# Patient Record
Sex: Female | Born: 1935 | Race: White | Hispanic: No | Smoking: Former smoker
Health system: Southern US, Community
[De-identification: ages and names within clinical notes are randomized; demographics above are authoritative.]

## PROBLEM LIST (undated history)

## (undated) DIAGNOSIS — K219 Gastro-esophageal reflux disease without esophagitis: Secondary | ICD-10-CM

## (undated) DIAGNOSIS — Z9889 Other specified postprocedural states: Secondary | ICD-10-CM

## (undated) DIAGNOSIS — I10 Essential (primary) hypertension: Secondary | ICD-10-CM

## (undated) DIAGNOSIS — E785 Hyperlipidemia, unspecified: Secondary | ICD-10-CM

## (undated) DIAGNOSIS — D509 Iron deficiency anemia, unspecified: Secondary | ICD-10-CM

## (undated) DIAGNOSIS — C50919 Malignant neoplasm of unspecified site of unspecified female breast: Secondary | ICD-10-CM

## (undated) DIAGNOSIS — F0390 Unspecified dementia without behavioral disturbance: Secondary | ICD-10-CM

## (undated) DIAGNOSIS — J449 Chronic obstructive pulmonary disease, unspecified: Secondary | ICD-10-CM

## (undated) DIAGNOSIS — E039 Hypothyroidism, unspecified: Secondary | ICD-10-CM

## (undated) DIAGNOSIS — M810 Age-related osteoporosis without current pathological fracture: Secondary | ICD-10-CM

## (undated) DIAGNOSIS — Z8679 Personal history of other diseases of the circulatory system: Secondary | ICD-10-CM

## (undated) DIAGNOSIS — N189 Chronic kidney disease, unspecified: Secondary | ICD-10-CM

## (undated) DIAGNOSIS — I739 Peripheral vascular disease, unspecified: Secondary | ICD-10-CM

## (undated) HISTORY — DX: Other specified postprocedural states: Z98.890

## (undated) HISTORY — PX: ABDOMINAL AORTIC ANEURYSM REPAIR: SUR1152

## (undated) HISTORY — DX: Gastro-esophageal reflux disease without esophagitis: K21.9

## (undated) HISTORY — DX: Unspecified dementia without behavioral disturbance: F03.90

## (undated) HISTORY — DX: Hyperlipidemia, unspecified: E78.5

## (undated) HISTORY — DX: Peripheral vascular disease, unspecified: I73.9

## (undated) HISTORY — DX: Hypothyroidism, unspecified: E03.9

## (undated) HISTORY — DX: Essential (primary) hypertension: I10

## (undated) HISTORY — DX: Age-related osteoporosis without current pathological fracture: M81.0

## (undated) HISTORY — DX: Personal history of other diseases of the circulatory system: Z86.79

## (undated) HISTORY — DX: Chronic kidney disease, unspecified: N18.9

## (undated) HISTORY — DX: Iron deficiency anemia, unspecified: D50.9

## (undated) HISTORY — DX: Malignant neoplasm of unspecified site of unspecified female breast: C50.919

## (undated) HISTORY — PX: BREAST SURGERY: SHX581

## (undated) HISTORY — DX: Chronic obstructive pulmonary disease, unspecified: J44.9

---

## 2013-01-02 ENCOUNTER — Ambulatory Visit (INDEPENDENT_AMBULATORY_CARE_PROVIDER_SITE_OTHER): Payer: Medicare Other | Admitting: Family Medicine

## 2013-01-02 ENCOUNTER — Encounter: Payer: Self-pay | Admitting: Family Medicine

## 2013-01-02 VITALS — BP 102/58 | HR 51 | Temp 97.4°F | Ht 65.0 in | Wt 118.0 lb

## 2013-01-02 DIAGNOSIS — C50919 Malignant neoplasm of unspecified site of unspecified female breast: Secondary | ICD-10-CM | POA: Insufficient documentation

## 2013-01-02 DIAGNOSIS — I129 Hypertensive chronic kidney disease with stage 1 through stage 4 chronic kidney disease, or unspecified chronic kidney disease: Secondary | ICD-10-CM

## 2013-01-02 DIAGNOSIS — I10 Essential (primary) hypertension: Secondary | ICD-10-CM

## 2013-01-02 DIAGNOSIS — I4891 Unspecified atrial fibrillation: Secondary | ICD-10-CM | POA: Insufficient documentation

## 2013-01-02 DIAGNOSIS — Z8679 Personal history of other diseases of the circulatory system: Secondary | ICD-10-CM

## 2013-01-02 DIAGNOSIS — E785 Hyperlipidemia, unspecified: Secondary | ICD-10-CM | POA: Insufficient documentation

## 2013-01-02 DIAGNOSIS — E039 Hypothyroidism, unspecified: Secondary | ICD-10-CM

## 2013-01-02 DIAGNOSIS — D509 Iron deficiency anemia, unspecified: Secondary | ICD-10-CM | POA: Insufficient documentation

## 2013-01-02 DIAGNOSIS — F0390 Unspecified dementia without behavioral disturbance: Secondary | ICD-10-CM | POA: Insufficient documentation

## 2013-01-02 DIAGNOSIS — L608 Other nail disorders: Secondary | ICD-10-CM

## 2013-01-02 DIAGNOSIS — I739 Peripheral vascular disease, unspecified: Secondary | ICD-10-CM

## 2013-01-02 DIAGNOSIS — C50911 Malignant neoplasm of unspecified site of right female breast: Secondary | ICD-10-CM

## 2013-01-02 DIAGNOSIS — L609 Nail disorder, unspecified: Secondary | ICD-10-CM

## 2013-01-02 DIAGNOSIS — M810 Age-related osteoporosis without current pathological fracture: Secondary | ICD-10-CM

## 2013-01-02 DIAGNOSIS — K219 Gastro-esophageal reflux disease without esophagitis: Secondary | ICD-10-CM | POA: Insufficient documentation

## 2013-01-02 DIAGNOSIS — J449 Chronic obstructive pulmonary disease, unspecified: Secondary | ICD-10-CM

## 2013-01-02 DIAGNOSIS — N189 Chronic kidney disease, unspecified: Secondary | ICD-10-CM

## 2013-01-02 DIAGNOSIS — Z9889 Other specified postprocedural states: Secondary | ICD-10-CM

## 2013-01-02 DIAGNOSIS — F0391 Unspecified dementia with behavioral disturbance: Secondary | ICD-10-CM

## 2013-01-02 HISTORY — DX: Gastro-esophageal reflux disease without esophagitis: K21.9

## 2013-01-02 HISTORY — DX: Unspecified dementia, unspecified severity, without behavioral disturbance, psychotic disturbance, mood disturbance, and anxiety: F03.90

## 2013-01-02 HISTORY — DX: Peripheral vascular disease, unspecified: I73.9

## 2013-01-02 HISTORY — DX: Essential (primary) hypertension: I10

## 2013-01-02 HISTORY — DX: Other specified postprocedural states: Z98.890

## 2013-01-02 HISTORY — DX: Personal history of other diseases of the circulatory system: Z86.79

## 2013-01-02 HISTORY — DX: Chronic kidney disease, unspecified: N18.9

## 2013-01-02 HISTORY — DX: Iron deficiency anemia, unspecified: D50.9

## 2013-01-02 HISTORY — DX: Hyperlipidemia, unspecified: E78.5

## 2013-01-02 HISTORY — DX: Age-related osteoporosis without current pathological fracture: M81.0

## 2013-01-02 HISTORY — DX: Malignant neoplasm of unspecified site of unspecified female breast: C50.919

## 2013-01-02 HISTORY — DX: Chronic obstructive pulmonary disease, unspecified: J44.9

## 2013-01-02 HISTORY — DX: Hypothyroidism, unspecified: E03.9

## 2013-01-02 LAB — CBC WITH DIFFERENTIAL/PLATELET
Basophils Relative: 0.5 % (ref 0.0–3.0)
Eosinophils Absolute: 0.5 10*3/uL (ref 0.0–0.7)
Hemoglobin: 11.9 g/dL — ABNORMAL LOW (ref 12.0–15.0)
Lymphocytes Relative: 22.5 % (ref 12.0–46.0)
MCHC: 33.7 g/dL (ref 30.0–36.0)
Monocytes Relative: 7.1 % (ref 3.0–12.0)
Neutro Abs: 8.9 10*3/uL — ABNORMAL HIGH (ref 1.4–7.7)
RBC: 3.76 Mil/uL — ABNORMAL LOW (ref 3.87–5.11)

## 2013-01-02 LAB — BASIC METABOLIC PANEL
CO2: 30 mEq/L (ref 19–32)
Calcium: 9.2 mg/dL (ref 8.4–10.5)
Chloride: 96 mEq/L (ref 96–112)
Sodium: 135 mEq/L (ref 135–145)

## 2013-01-02 MED ORDER — METOPROLOL TARTRATE 100 MG PO TABS: 100.0000 mg | ORAL_TABLET | Freq: Two times a day (BID) | ORAL | Status: DC

## 2013-01-02 MED ORDER — FUROSEMIDE 40 MG PO TABS: 40.0000 mg | ORAL_TABLET | Freq: Every day | ORAL | Status: DC

## 2013-01-02 MED ORDER — CLONIDINE HCL 0.1 MG PO TABS: 0.1000 mg | ORAL_TABLET | Freq: Two times a day (BID) | ORAL | Status: DC

## 2013-01-02 MED ORDER — SIMVASTATIN 40 MG PO TABS: 40.0000 mg | ORAL_TABLET | Freq: Every evening | ORAL | Status: DC

## 2013-01-02 MED ORDER — LEVOTHYROXINE SODIUM 25 MCG PO TABS: 37.5000 ug | ORAL_TABLET | Freq: Every day | ORAL | Status: DC

## 2013-01-02 MED ORDER — AMLODIPINE BESYLATE 5 MG PO TABS: 5.0000 mg | ORAL_TABLET | Freq: Every day | ORAL | Status: DC

## 2013-01-02 MED ORDER — RIVAROXABAN 20 MG PO TABS: 20.0000 mg | ORAL_TABLET | Freq: Every day | ORAL | Status: DC

## 2013-01-02 NOTE — Progress Notes (Signed)
Chief Complaint  Patient presents with  . Establish Care    HPI:  Caroline Underwood is here to establish care. She is and 79yo F with a very complicated PMH who recently moved to the area from [place] , [place] to live with her daughter. Per review of records - recently saw prior PCP (Dr. Bobette Mo in [place], [place])with extensive exam, labs and EKG on 11/30/12 in preparation for R breast mastecotmy for new R breast carcinoma performed on 12/14/2012. Daughter reports she was told all cancer was removed surgically and there was no plan to do radiation or chemo. Told to follow up with oncology here soon after move.  Per review of notes recent Cr 1.7, CBC ok, mild chronic BNP elevated. TSH 5.6, cholesterol 211 with LDL 103 in March - pt placed on L-thyroxine then. Benicar stopped in march per notes when Cr mildy elevated, pt then suffered hypertensive urgency. She was hospitalized in April for AMS, htn urgency. CT and MRI at the time showed no acute changes. She suffered new A. Flutter in hospital and was evaluated by cardiology and started on xarelto. Per notes appears had echo done at the time as well showing diastolic dysfunction and mild pulm htn. Labs from 5/21 show Cr 1.7, BMP otherwise normal, Hgb 12.0. 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 On 5/5 TSH 4.6 and T4 7.0.Labs from April show iron def anemia.  Since recent surgery she has done well per daughter. Is eating well and has gained weight. Denies fevers, chills, dizziness, AMS, CP, SOB, DOE, palpitations, dysuria, bowel or bladder issues. Reports healing well. No Falls or pain. No depression. No acid reflux, stopped PPI recently, had ulcers very remotely per daughter. Wants referral to podiatry for toenail deformity.  [name] Chemical engineer (daughter) - pt reports she is ok with any information given to this daughter Eliane Decree is [place] (daughter): [phone], thinks she is DNR  Has the  following chronic problems and concerns today:  Patient Active Problem List   Diagnosis Date Noted  . Breast cancer 01/02/2013  . Essential hypertension, benign 01/02/2013  . Hx of atrial flutter, on Xarelto 01/02/2013  . Hyperlipemia 01/02/2013  . Osteoporosis, unspecified, on fosamax >5 years in the past per PCP notes 01/02/2013  . PAD (peripheral artery disease) 01/02/2013  . Chronic kidney disease 01/02/2013  . Unspecified hypothyroidism 01/02/2013  . GERD (gastroesophageal reflux disease) 01/02/2013  . Dementia, on Namenda briefly in the past per review of records 01/02/2013  . COPD (chronic obstructive pulmonary disease) 01/02/2013  . Iron deficiency anemia 01/02/2013   ROS: See pertinent positives and negatives per HPI.  Past Medical History  Diagnosis Date  . Status post AAA (abdominal aortic aneurysm) repair 01/02/2013  . PAD (peripheral artery disease) 01/02/2013  . Osteoporosis, unspecified 01/02/2013  . Hyperlipemia 01/02/2013  . Hx of atrial flutter, on Xarelto 01/02/2013  . Essential hypertension, benign 01/02/2013  . Chronic kidney disease 01/02/2013  . Breast cancer 01/02/2013  . COPD (chronic obstructive pulmonary disease) 01/02/2013  . Dementia, on Namenda briefly in the past per review of records 01/02/2013  . GERD (gastroesophageal reflux disease) 01/02/2013  . Iron deficiency anemia 01/02/2013  . Unspecified hypothyroidism 01/02/2013    Family History  Problem Relation Age of Onset  . Cancer Mother     lung  . Heart disease Father 30    History   Social History  . Marital Status: Widowed    Spouse Name: N/A  Number of Children: N/A  . Years of Education: N/A   Social History Main Topics  . Smoking status: Former Games developer  . Smokeless tobacco: None  . Alcohol Use: No  . Drug Use: None  . Sexually Active: None   Other Topics Concern  . None   Social History Narrative   Home Situation: living with daughter [name])      Spiritual Beliefs: none       Lifestyle: get around well in the house - uses a walker sometimes, has not had a history of any falls, has some mild dementia. She needs help with bathing. She needs some help with dressing. She does not do her own cooking or cleaning. Does not drive. She did manage all of her finances until 10/2012.             Current outpatient prescriptions:amLODipine (NORVASC) 5 MG tablet, Take 1 tablet (5 mg total) by mouth daily., Disp: 90 tablet, Rfl: 1;  aspirin 81 MG tablet, Take 81 mg by mouth daily., Disp: , Rfl: ;  cloNIDine (CATAPRES) 0.1 MG tablet, Take 1 tablet (0.1 mg total) by mouth 2 (two) times daily., Disp: 180 tablet, Rfl: 1;  Ergocalciferol (VITAMIN D2 PO), 12.5 tablets. One every Tuesday at 5 pm, Disp: , Rfl:  Fluticasone-Salmeterol (ADVAIR) 100-50 MCG/DOSE AEPB, Inhale 1 puff into the lungs every 12 (twelve) hours., Disp: , Rfl: ;  furosemide (LASIX) 40 MG tablet, Take 1 tablet (40 mg total) by mouth daily., Disp: 90 tablet, Rfl: 1;  lactulose (CHRONULAC) 10 GM/15ML solution, 30 g. 30 ml every 4 hours as needed for constipation, Disp: , Rfl:  levothyroxine (SYNTHROID) 25 MCG tablet, Take 1.5 tablets (37.5 mcg total) by mouth daily before breakfast., Disp: 90 tablet, Rfl: 1;  Melatonin 3 MG TABS, Take 3 mg by mouth at bedtime., Disp: , Rfl: ;  metoprolol (LOPRESSOR) 100 MG tablet, Take 1 tablet (100 mg total) by mouth 2 (two) times daily., Disp: 180 tablet, Rfl: 1;  potassium chloride SA (K-DUR,KLOR-CON) 20 MEQ tablet, Take 20 mEq by mouth daily., Disp: , Rfl:  Rivaroxaban (XARELTO) 20 MG TABS, Take 1 tablet (20 mg total) by mouth daily., Disp: 30 tablet, Rfl: 2;  simvastatin (ZOCOR) 40 MG tablet, Take 1 tablet (40 mg total) by mouth every evening., Disp: 90 tablet, Rfl: 1;  vitamin C (ASCORBIC ACID) 500 MG tablet, Take 500 mg by mouth daily., Disp: , Rfl:   EXAM:  Filed Vitals:   01/02/13 1120  BP: 102/58  Pulse: 51  Temp: 97.4 F (36.3 C)    Body mass index is 19.64  kg/(m^2).  GENERAL: vitals reviewed and listed above, alert, oriented, appears well hydrated and in no acute distress  HEENT: atraumatic, conjunttiva clear, no obvious abnormalities on inspection of external nose and ears  NECK: no obvious masses on inspection  LUNGS: clear to auscultation bilaterally, no wheezes, rales or rhonchi, good air movement  CV: HRRR, no peripheral edema  SKIN: postsurgical healing of R breast with steri strips in place, appears to be healing well with no signs of infection. Stage 1 pressure ulcer sacrum.  MS: moves all extremities without noticeable abnormality  PSYCH: pleasant and cooperative, no obvious depression or anxiety  ASSESSMENT AND PLAN:  Discussed the following assessment and plan:  Breast cancer, right - Plan: Ambulatory referral to Oncology  Essential hypertension, benign - Plan: Ambulatory referral to Cardiology, Basic metabolic panel  Hx of atrial flutter, on Xarelto - Plan: Ambulatory referral to Cardiology  Hyperlipemia - Plan: Ambulatory referral to Cardiology  Osteoporosis, unspecified  PAD (peripheral artery disease) - Plan: Ambulatory referral to Cardiology  Chronic kidney disease, unspecified stage - Plan: Basic metabolic panel  Status post AAA (abdominal aortic aneurysm) repair - Plan: Ambulatory referral to Cardiology  Unspecified hypothyroidism  GERD (gastroesophageal reflux disease)  COPD (chronic obstructive pulmonary disease)  Iron deficiency anemia - Plan: CBC with Differential  Dementia, with behavioral disturbance - Plan: Ambulatory referral to Neurology  Toenail deformity - Plan: Ambulatory referral to Podiatry   -We reviewed the PMH, PSH, FH, SH, Meds and Allergies. -We provided refills for any medications we will prescribe as needed. -We addressed current concerns per orders and patient instructions. -We reviewed and discussed an extensive stack of records from prior PCP and from [facility] from recent visits. -referral to neurologist regarding options for dementia as has tried several medications in the past -referral to cardiolgy for her heart issues and follow up of her AAA, PAD, management of hx a. Flutter and mild DHF -will repeat CBC and BMP today as per daughter not done since surgery, continue current medications -wound care recs for minor stage 1 sacral pressure ulcer and daughter to check daily and RTC immediately if any worsening or persists -placed referral to oncology here in regards to her recent breast cancer and managment -We have advised patient to follow up per instructions below. -hold on iron and Kcl until review of labs, refills for all other medications provided, though will defer to cards once seen to determine need for xarelto >45 minutes spent face to face with this patient and daughter  -Patient advised to return or notify a doctor immediately if symptoms worsen or persist or new concerns arise.  Patient Instructions  Please ensure she has HCPOA and Advanced directives in place [email or web address] [phone]  -We placed a referral for you as discussed to the oncologist, cardiologist, neurologist and podiatrist. It usually takes about 1-2 weeks to process and schedule this referral. If you have not heard from [place] regarding this appointment in 2 weeks please contact our office.  -We have ordered labs or studies at this visit. It can take up to 1-2 weeks for results and processing. We will contact you with instructions IF your results are abnormal. Normal results will be released to your [facility]. If you have not heard from [place] or can not find your results in [facility] in 2 weeks please contact our office.  -PLEASE SIGN UP FOR MYCHART TODAY   We recommend the following healthy lifestyle measures: - eat a healthy diet consisting of lots of vegetables, fruits, beans, nuts, seeds, healthy meats such as  white chicken and fish and whole grains.  - avoid fried foods, fast food, processed foods, sodas, red meet and other fattening foods.  - get a least 150 minutes of aerobic exercise per week.   Follow up in: 2-3 months       KIM, HANNAH R.

## 2013-01-02 NOTE — Patient Instructions (Addendum)
Please ensure she has HCPOA and Advanced directives in place [email or web address] [phone]  -We placed a referral for you as discussed to the oncologist, cardiologist, neurologist and podiatrist. It usually takes about 1-2 weeks to process and schedule this referral. If you have not heard from [place] regarding this appointment in 2 weeks please contact our office.  -We have ordered labs or studies at this visit. It can take up to 1-2 weeks for results and processing. We will contact you with instructions IF your results are abnormal. Normal results will be released to your [facility]. If you have not heard from [place] or can not find your results in [facility] in 2 weeks please contact our office.  -PLEASE SIGN UP FOR MYCHART TODAY   We recommend the following healthy lifestyle measures: - eat a healthy diet consisting of lots of vegetables, fruits, beans, nuts, seeds, healthy meats such as white chicken and fish and whole grains.  - avoid fried foods, fast food, processed foods, sodas, red meet and other fattening foods.  - get a least 150 minutes of aerobic exercise per week.   Follow up in: 2-3 months

## 2013-01-03 ENCOUNTER — Telehealth: Payer: Self-pay | Admitting: Neurology

## 2013-01-03 NOTE — Progress Notes (Signed)
Quick Note:  Left a message for return call. ______ 

## 2013-01-04 ENCOUNTER — Telehealth: Payer: Self-pay | Admitting: *Deleted

## 2013-01-04 ENCOUNTER — Telehealth: Payer: Self-pay

## 2013-01-04 NOTE — Telephone Encounter (Signed)
Pt's daughter will come into the office on next Wednesday to sign DPR due to mother having dementia.

## 2013-01-04 NOTE — Telephone Encounter (Signed)
Spoke w/ pt's daughter [name]) and confirmed 02/05/13 appt w/ pt.  Mailed before appt letter & packet to pt.  Obtained previous facility's phone number to obtain records.  Will call them on Monday.  Emailed Kriste Basque at referring to make aware.  Took paperwork to Med Rec for chart.

## 2013-01-07 NOTE — Progress Notes (Signed)
Quick Note:  Advised that DPR was needed. Pt has dementia. Advised lab appt needed and DPR needs to be signed and pt will receive labs. ______

## 2013-01-08 ENCOUNTER — Encounter: Payer: Self-pay | Admitting: Family Medicine

## 2013-01-09 ENCOUNTER — Other Ambulatory Visit (INDEPENDENT_AMBULATORY_CARE_PROVIDER_SITE_OTHER): Payer: Medicare Other

## 2013-01-09 ENCOUNTER — Other Ambulatory Visit: Payer: Self-pay | Admitting: Family Medicine

## 2013-01-09 DIAGNOSIS — D72829 Elevated white blood cell count, unspecified: Secondary | ICD-10-CM

## 2013-01-09 LAB — CBC WITH DIFFERENTIAL/PLATELET
Basophils Absolute: 0 10*3/uL (ref 0.0–0.1)
Basophils Relative: 0.3 % (ref 0.0–3.0)
Eosinophils Absolute: 0.4 10*3/uL (ref 0.0–0.7)
Eosinophils Relative: 3.9 % (ref 0.0–5.0)
HCT: 34.7 % — ABNORMAL LOW (ref 36.0–46.0)
Lymphs Abs: 2.3 10*3/uL (ref 0.7–4.0)
MCHC: 33.3 g/dL (ref 30.0–36.0)
MCV: 94.7 fl (ref 78.0–100.0)
Monocytes Absolute: 0.8 10*3/uL (ref 0.1–1.0)
Neutro Abs: 6.7 10*3/uL (ref 1.4–7.7)
Neutrophils Relative %: 65.4 % (ref 43.0–77.0)
RBC: 3.66 Mil/uL — ABNORMAL LOW (ref 3.87–5.11)

## 2013-01-10 NOTE — Progress Notes (Signed)
Quick Note:  Left a detailed message for pt's daughter at designated cell phone number. ______

## 2013-01-11 ENCOUNTER — Encounter: Payer: Self-pay | Admitting: Family Medicine

## 2013-01-14 NOTE — Telephone Encounter (Signed)
Pls advise.  Do you want this called in?

## 2013-01-15 ENCOUNTER — Encounter: Payer: Self-pay | Admitting: Family Medicine

## 2013-01-16 MED ORDER — FLUTICASONE-SALMETEROL 100-50 MCG/DOSE IN AEPB: 1.0000 | INHALATION_SPRAY | Freq: Two times a day (BID) | RESPIRATORY_TRACT | Status: DC

## 2013-01-16 NOTE — Addendum Note (Signed)
Addended by: Azucena Freed on: 01/16/2013 11:05 AM   Modules accepted: Orders

## 2013-01-16 NOTE — Telephone Encounter (Signed)
Caroline Underwood, ok to refill. Can you please find out what medications need refills? They need to let their pharmacy know to send refill request to [place] too. Thanks.

## 2013-01-16 NOTE — Telephone Encounter (Signed)
Pls advise.  

## 2013-01-16 NOTE — Telephone Encounter (Signed)
Rx sent to pharmacy   

## 2013-01-22 ENCOUNTER — Telehealth: Payer: Self-pay | Admitting: Family Medicine

## 2013-01-22 ENCOUNTER — Encounter: Payer: Self-pay | Admitting: Family Medicine

## 2013-01-22 NOTE — Telephone Encounter (Signed)
Pls advise.  

## 2013-01-22 NOTE — Telephone Encounter (Signed)
Caroline Underwood,  She probably should be seen. This is a new problem I believe?

## 2013-01-23 ENCOUNTER — Ambulatory Visit (INDEPENDENT_AMBULATORY_CARE_PROVIDER_SITE_OTHER): Payer: Medicare Other | Admitting: Neurology

## 2013-01-23 ENCOUNTER — Encounter: Payer: Self-pay | Admitting: Neurology

## 2013-01-23 VITALS — BP 148/70 | HR 60 | Temp 97.5°F | Ht 65.0 in | Wt 119.2 lb

## 2013-01-23 DIAGNOSIS — G309 Alzheimer's disease, unspecified: Secondary | ICD-10-CM | POA: Insufficient documentation

## 2013-01-23 DIAGNOSIS — F0280 Dementia in other diseases classified elsewhere without behavioral disturbance: Secondary | ICD-10-CM

## 2013-01-23 MED ORDER — MEMANTINE HCL ER 7 MG PO CP24: 7.0000 mg | ORAL_CAPSULE | Freq: Every day | ORAL | Status: DC

## 2013-01-23 NOTE — Progress Notes (Signed)
NEUROLOGY CONSULTATION NOTE  Selena Swaminathan MRN: [id number] DOB: [birth year]  Referring physician: Dr. Selena Batten Primary care physician: Dr. Selena Batten  Reason for consult:  dementia  HISTORY OF PRESENT ILLNESS: Caroline Underwood is a 79 y.o. female with history of right breast cancer, hypertension, peripheral artery disease, chronic kidney disease, iron deficiency anemia, COPD, and a flutter presents to establish care regarding history of dementia. She is accompanied by her daughter.  History obtained from the chart and her daughter.  She recently moved from [facility] to live with her daughter.  She began experiencing symptoms many years ago. She was having problems with memory and often repeating questions, as well as forgetting tasks and misplacing objects. At some point, several years ago, she was started on Namenda by her primary care physician, but discontinued it at the request of her son. There has been a progressive decline in her condition. Her decline has significantly worsened this past year, especially since a recent hospitalization earlier this year. Earlier this year, she was found to have an elevated creatinine level. Her doctor therefore stop some of her blood pressure medications. She then developed an episode of altered mental status, where she was walking around naked and then was laying on the couch for 2 days. When she was admitted to the hospital, she was found to have hypertensive urgency.  She was found to have hypertensive urgency. MRI of the brain did not show any acute changes. She was also found to have a flutter and was started on Xarelto.  Recent labs showed a TSH of 5.6 and she was subsequently started on Synthroid.  Cr was mildly elevated at 1.7.  Ammonia level was 23.  B12 was ordered and reportedly normal.  Her confusion has improved and she is functioning better, but definitely worse than before. She has good days and bad days. She has always been able to pay  her bills, up until this past year. She constantly chews her lip. She sleeps okay and takes melatonin. She denies feelings of depression. She needs assistance with activities of daily living, including taking medications, bathing, and dressing. She emulate with a walker. She usually spends the entire day sitting and watching TV. She doesn't read much except for Mr. Bea Laura. Anson Fret. She enjoys trying to solve the puzzle before the end of the story. Most other books, she has no interest in and quickly stopped reading. She also is not interested in crossword puzzles or brain teasers.  She does not have any hallucinations, delusions, or change in personality.  PAST MEDICAL HISTORY: Past Medical History  Diagnosis Date  . Status post AAA (abdominal aortic aneurysm) repair 01/02/2013  . PAD (peripheral artery disease) 01/02/2013  . Osteoporosis, unspecified 01/02/2013  . Hyperlipemia 01/02/2013  . Hx of atrial flutter, on Xarelto 01/02/2013  . Essential hypertension, benign 01/02/2013  . Chronic kidney disease 01/02/2013  . Breast cancer 01/02/2013  . COPD (chronic obstructive pulmonary disease) 01/02/2013  . Dementia, on Namenda briefly in the past per review of records 01/02/2013  . GERD (gastroesophageal reflux disease) 01/02/2013  . Iron deficiency anemia 01/02/2013  . Unspecified hypothyroidism 01/02/2013    PAST SURGICAL HISTORY: Past Surgical History  Procedure Laterality Date  . Abdominal aortic aneurysm repair    . Breast surgery      right lumpectomy    MEDICATIONS: Current Outpatient Prescriptions on File Prior to Visit  Medication Sig Dispense Refill  . amLODipine (NORVASC) 5 MG tablet Take 1 tablet (5 mg total) by  mouth daily.  90 tablet  1  . aspirin 81 MG tablet Take 81 mg by mouth daily.      . cloNIDine (CATAPRES) 0.1 MG tablet Take 1 tablet (0.1 mg total) by mouth 2 (two) times daily.  180 tablet  1  . Ergocalciferol (VITAMIN D2 PO) 12.5 tablets. One every Tuesday at 5 pm      .  Fluticasone-Salmeterol (ADVAIR) 100-50 MCG/DOSE AEPB Inhale 1 puff into the lungs every 12 (twelve) hours.  60 each  3  . furosemide (LASIX) 40 MG tablet Take 1 tablet (40 mg total) by mouth daily.  90 tablet  1  . lactulose (CHRONULAC) 10 GM/15ML solution 30 g. 30 ml every 4 hours as needed for constipation      . levothyroxine (SYNTHROID) 25 MCG tablet Take 1.5 tablets (37.5 mcg total) by mouth daily before breakfast.  90 tablet  1  . Melatonin 3 MG TABS Take 3 mg by mouth at bedtime.      . metoprolol (LOPRESSOR) 100 MG tablet Take 1 tablet (100 mg total) by mouth 2 (two) times daily.  180 tablet  1  . potassium chloride SA (K-DUR,KLOR-CON) 20 MEQ tablet Take 20 mEq by mouth daily.      . Rivaroxaban (XARELTO) 20 MG TABS Take 1 tablet (20 mg total) by mouth daily.  30 tablet  2  . simvastatin (ZOCOR) 40 MG tablet Take 1 tablet (40 mg total) by mouth every evening.  90 tablet  1  . vitamin C (ASCORBIC ACID) 500 MG tablet Take 500 mg by mouth daily.       No current facility-administered medications on file prior to visit.    ALLERGIES: No Known Allergies  FAMILY HISTORY: Family History  Problem Relation Age of Onset  . Cancer Mother     lung  . Heart disease Father 34    SOCIAL HISTORY: History   Social History  . Marital Status: Widowed    Spouse Name: N/A    Number of Children: N/A  . Years of Education: N/A   Occupational History  . Not on file.   Social History Main Topics  . Smoking status: Former Games developer  . Smokeless tobacco: Never Used  . Alcohol Use: No  . Drug Use: No  . Sexually Active: Not on file   Other Topics Concern  . Not on file   Social History Narrative   Home Situation: living with daughter [name])      Spiritual Beliefs: none      Lifestyle: get around well in the house - uses a walker sometimes, has not had a history of any falls, has some mild dementia. She needs help with bathing. She needs some help with dressing. She does not do her own  cooking or cleaning. Does not drive. She did manage all of her finances until 10/2012.             REVIEW OF SYSTEMS: Constitutional: No fevers, chills, or sweats, no generalized fatigue, change in appetite Eyes: No visual changes, double vision, eye pain Ear, nose and throat: No hearing loss, ear pain, nasal congestion, sore throat Cardiovascular: No chest pain, palpitations Respiratory:  No shortness of breath at rest or with exertion, wheezes GastrointestinaI: No nausea, vomiting, diarrhea, abdominal pain, fecal incontinence Genitourinary:  No dysuria, urinary retention or frequency Musculoskeletal:  No neck pain, back pain Integumentary: No rash, pruritus, skin lesions Neurological: as above Psychiatric: No depression, insomnia, anxiety Endocrine: No palpitations, fatigue, diaphoresis, mood  swings, change in appetite, change in weight, increased thirst Hematologic/Lymphatic:  No anemia, purpura, petechiae. Allergic/Immunologic: no itchy/runny eyes, nasal congestion, recent allergic reactions, rashes  PHYSICAL EXAM: Filed Vitals:   01/23/13 0752  BP: 148/70  Pulse: 60  Temp: 97.5 F (36.4 C)   General: No acute distress Head:  Normocephalic/atraumatic Neck: supple, no paraspinal tenderness, full range of motion Back: No paraspinal tenderness Heart: regular rate and rhythm Lungs: Clear to auscultation bilaterally. Neurological Exam: Mental status: alert and oriented to person, place (except city), ant time (except date and day).speech fluent and not dysarthric. Naming, repetition, and following simple commands intact. She had significant difficulty with visual spatial and executive functioning. She had difficulty copying a cube and appropriately placing the numbers in a clock. Attention and serial 7 subtraction is intact. She had significant difficulty with abstraction. Delayed recall was poor. She was unable to recall any words after several minutes. MOCA score 15/30. Cranial  nerves: CN I: not tested CN II: pupils equal, round and reactive to light, visual fields intact, fundi unremarkable CN III, IV, VI:  full range of motion, no nystagmus, no ptosis CN V: facial sensation intact CN VII: upper and lower face symmetric CN VIII: hearing intact CN IX, X: gag intact, uvula midline CN XI: sternocleidomastoid and trapezius muscles intact CN XII: tongue midline Bulk & Tone: normal, no fasciculations. Muscle strength:5/5 throughout Sensation: pinprick sensation intact. Reduced vibration sensation in the feet. Deep Tendon Reflexes: 1+ throughout except absent in the ankles. Toes downgoing. Finger to nose testing: no dysmetria. Gait: wide-based with small steps, with assistance from a walker. Romberg with sway.  IMPRESSION & PLAN: Caroline Underwood is a 79 y.o. female with probable Alzheimer's dementia. 1.  We will start Namenda ER and titrate slowly to a goal of 28 mg daily.side effects discussed. She would call with any problems. 2.  Read books and newspapers.  Continue reading mysteries and try to solve the case. 3.  Walk daily or at least 3 times a week around the block. 4.  Provided information regarding Alzheimer's support groups. 5.  Follow up in 6 months and call with any questions or concerns.  60 minutes spent with the patient and her daughter, over 50% spent counseling and coordinating care.  Thank you for allowing me to take part in the care of this patient.  Shon Millet, DO  CC: Terressa Koyanagi, DO

## 2013-01-23 NOTE — Patient Instructions (Addendum)
1.  We will start memantine ER (Namenda XR) 7mg  tablets.  We will increase dose to goal of 28mg  daily, as per the following schedule.  Start 1 tablet daily for one week, then 2 tablets daily for one week, then 3 tablets daily for one week.  At that point, call the clinic and we can prescribe a larger single dose tablet of 28mg , to be taken daily.  Side effects include dizziness, headache, diarrhea or constipation.  Call with any questions or concerns.  2.  Read books and newspapers.  Continue reading mysteries and try to solve the case. 3.  Walk daily or at least 3 times a week around the block. 4.  Take a look at the packets, regarding Alzheimer's support groups. 5.  Follow up in 6 months and call with any questions or concerns.

## 2013-01-24 ENCOUNTER — Encounter: Payer: Self-pay | Admitting: Family Medicine

## 2013-01-24 MED ORDER — POTASSIUM CHLORIDE CRYS ER 20 MEQ PO TBCR: 20.0000 meq | EXTENDED_RELEASE_TABLET | Freq: Every day | ORAL | Status: DC

## 2013-01-24 NOTE — Telephone Encounter (Signed)
Rx for potassium called in to pharmacy.

## 2013-01-25 ENCOUNTER — Other Ambulatory Visit: Payer: Self-pay | Admitting: *Deleted

## 2013-01-25 DIAGNOSIS — C50511 Malignant neoplasm of lower-outer quadrant of right female breast: Secondary | ICD-10-CM

## 2013-01-25 DIAGNOSIS — C50519 Malignant neoplasm of lower-outer quadrant of unspecified female breast: Secondary | ICD-10-CM | POA: Insufficient documentation

## 2013-02-01 ENCOUNTER — Encounter: Payer: Self-pay | Admitting: Neurology

## 2013-02-05 ENCOUNTER — Ambulatory Visit (HOSPITAL_BASED_OUTPATIENT_CLINIC_OR_DEPARTMENT_OTHER): Payer: Medicare Other

## 2013-02-05 ENCOUNTER — Encounter: Payer: Self-pay | Admitting: Oncology

## 2013-02-05 ENCOUNTER — Other Ambulatory Visit (HOSPITAL_BASED_OUTPATIENT_CLINIC_OR_DEPARTMENT_OTHER): Payer: Medicare Other | Admitting: Lab

## 2013-02-05 ENCOUNTER — Ambulatory Visit (HOSPITAL_BASED_OUTPATIENT_CLINIC_OR_DEPARTMENT_OTHER): Payer: Medicare Other | Admitting: Oncology

## 2013-02-05 ENCOUNTER — Telehealth: Payer: Self-pay | Admitting: *Deleted

## 2013-02-05 VITALS — BP 159/65 | HR 66 | Temp 97.7°F | Resp 20 | Ht 65.0 in | Wt 121.7 lb

## 2013-02-05 DIAGNOSIS — C50511 Malignant neoplasm of lower-outer quadrant of right female breast: Secondary | ICD-10-CM

## 2013-02-05 DIAGNOSIS — C50519 Malignant neoplasm of lower-outer quadrant of unspecified female breast: Secondary | ICD-10-CM

## 2013-02-05 DIAGNOSIS — C50919 Malignant neoplasm of unspecified site of unspecified female breast: Secondary | ICD-10-CM

## 2013-02-05 DIAGNOSIS — C50911 Malignant neoplasm of unspecified site of right female breast: Secondary | ICD-10-CM

## 2013-02-05 LAB — COMPREHENSIVE METABOLIC PANEL (CC13)
ALT: 19 U/L (ref 0–55)
AST: 26 U/L (ref 5–34)
Albumin: 3.7 g/dL (ref 3.5–5.0)
Calcium: 9.6 mg/dL (ref 8.4–10.4)
Chloride: 100 mEq/L (ref 98–109)
Potassium: 4.3 mEq/L (ref 3.5–5.1)

## 2013-02-05 LAB — CBC WITH DIFFERENTIAL/PLATELET
BASO%: 0.5 % (ref 0.0–2.0)
MCHC: 33.5 g/dL (ref 31.5–36.0)
MONO#: 0.9 10*3/uL (ref 0.1–0.9)
RBC: 4.09 10*6/uL (ref 3.70–5.45)
WBC: 12.2 10*3/uL — ABNORMAL HIGH (ref 3.9–10.3)
lymph#: 2.9 10*3/uL (ref 0.9–3.3)
nRBC: 0 % (ref 0–0)

## 2013-02-05 NOTE — Telephone Encounter (Signed)
This RN called to [facility] at [phone] and was transferred to pathology department.  Obtained identified VM. Request left for a return call to obtain result of pathology from [facility].

## 2013-02-05 NOTE — Progress Notes (Signed)
Checked in new patient. She ok daughter to sign. The email address is for the daughter ms. Schrecengost. Didn't ask if there is an living will/POA.

## 2013-02-10 NOTE — Progress Notes (Signed)
ID: Lurlean Horns OB: 03-12-1922  MR#: [id number]  CSN#:[id number]  PCP: Terressa Koyanagi., DO GYN:   SU:  OTHER MD:   HISTORY OF PRESENT ILLNESS: The patient herself palpated a mass in her right breast. She underwent mammography may first 2014 at [facility], and the radiologist notes that the patient has heterogeneously dense breasts. There was a palpable lump in the right breast at the 3:00 position and coarse dystrophic calcifications in the right upper lateral breast. The mass persisted on spot compression views and an ultrasound it measured 2.9 cm. Ultrasound of the right axilla demonstrated normal right axillary lymph nodes. Biopsy the same day reportedly showed an invasive ductal carcinoma, nuclear grade 2. The patient proceeded to definitive surgery later that same month, the pathology from that procedure is being obtained.  INTERVAL HISTORY: Ms. Kollman (pronounced "ashen") is establishing herself in my breast clinic today, accompanied by her daughter [name]  REVIEW OF SYSTEMS: The patient tells me she underwent of her right breast surgery without unusual complications, particularly there was no significant pain, bleeding, swelling, or fever. She has a little bit of a running nose, her ankles swell, she has poor circulation, she has shortness of breath with most activities, she has stress urinary incontinence, and she is very forgetful. A detailed review of systems today was otherwise noncontributory  PAST MEDICAL HISTORY: Past Medical History  Diagnosis Date  . Status post AAA (abdominal aortic aneurysm) repair 01/02/2013  . PAD (peripheral artery disease) 01/02/2013  . Osteoporosis, unspecified 01/02/2013  . Hyperlipemia 01/02/2013  . Hx of atrial flutter, on Xarelto 01/02/2013  . Essential hypertension, benign 01/02/2013  . Chronic kidney disease 01/02/2013  . Breast cancer 01/02/2013  . COPD (chronic obstructive pulmonary disease) 01/02/2013  . Dementia, on Namenda  briefly in the past per review of records 01/02/2013  . GERD (gastroesophageal reflux disease) 01/02/2013  . Iron deficiency anemia 01/02/2013  . Unspecified hypothyroidism 01/02/2013    PAST SURGICAL HISTORY: Past Surgical History  Procedure Laterality Date  . Abdominal aortic aneurysm repair    . Breast surgery      right lumpectomy    FAMILY HISTORY Family History  Problem Relation Age of Onset  . Cancer Mother     lung  . Heart disease Father 35   the patient's father died at the age of 77 from heart disease. The patient's mother died at the age of 9 from lung cancer in the setting of tobacco abuse. The patient has 2 brothers, no sisters. There is no history of breast or ovarian cancer in the family to her knowledge.  GYNECOLOGIC HISTORY:  Menarche age 87, first live birth age 33, she is GX P3. She does not recall when she went through menopause, but he was "probably in my late 39s". She did not take hormone replacement.  SOCIAL HISTORY:  Roddie Mc used to work as an Print production planner an Airline pilot. She has been widowed since 1997. She moved to this area to live with her daughter Park Liter who works for proctoscopy and ample. Melina Schools herself became a widow in 1999. She has a 68 year old son and 54 year old daughter and 2 grandchildren, living in [place]. [name] can be reached at [phone]. The patient's other 2 children are Annette Stable, who lives in [place], and according to the patient has an alcohol problem and is unemployed; and [address], who lives in [place], [place] and is a IT consultant. The patient has a total of 4 grandchildren. She is not  a church attender    ADVANCED DIRECTIVES: Not in place. The patient was given the appropriate documents to consider completing during her initial visit here 02/05/2013. She intends to name her daughter [name] as her healthcare power of attorney   HEALTH MAINTENANCE: History  Substance Use Topics  . Smoking  status: Former Games developer  . Smokeless tobacco: Never Used  . Alcohol Use: No     Colonoscopy:  PAP:  Bone density:  Lipid panel:  No Known Allergies  Current Outpatient Prescriptions  Medication Sig Dispense Refill  . amLODipine (NORVASC) 5 MG tablet Take 1 tablet (5 mg total) by mouth daily.  90 tablet  1  . aspirin 81 MG tablet Take 81 mg by mouth daily.      . cloNIDine (CATAPRES) 0.1 MG tablet Take 1 tablet (0.1 mg total) by mouth 2 (two) times daily.  180 tablet  1  . Ergocalciferol (VITAMIN D2 PO) 12.5 tablets. One every Tuesday at 5 pm      . Fluticasone-Salmeterol (ADVAIR) 100-50 MCG/DOSE AEPB Inhale 1 puff into the lungs every 12 (twelve) hours.  60 each  3  . furosemide (LASIX) 40 MG tablet Take 1 tablet (40 mg total) by mouth daily.  90 tablet  1  . lactulose (CHRONULAC) 10 GM/15ML solution 30 g. 30 ml every 4 hours as needed for constipation      . Melatonin 3 MG TABS Take 3 mg by mouth at bedtime.      . Memantine HCl ER (NAMENDA XR) 7 MG CP24 Take 7 mg by mouth at bedtime.  30 capsule  0  . metoprolol (LOPRESSOR) 100 MG tablet Take 1 tablet (100 mg total) by mouth 2 (two) times daily.  180 tablet  1  . potassium chloride SA (K-DUR,KLOR-CON) 20 MEQ tablet Take 1 tablet (20 mEq total) by mouth daily.  30 tablet  3  . Rivaroxaban (XARELTO) 20 MG TABS Take 1 tablet (20 mg total) by mouth daily.  30 tablet  2  . simvastatin (ZOCOR) 40 MG tablet Take 1 tablet (40 mg total) by mouth every evening.  90 tablet  1  . vitamin C (ASCORBIC ACID) 500 MG tablet Take 500 mg by mouth daily.      Marland Kitchen levothyroxine (SYNTHROID) 25 MCG tablet Take 1.5 tablets (37.5 mcg total) by mouth daily before breakfast.  90 tablet  1   No current facility-administered medications for this visit.    OBJECTIVE: Elderly white woman in no acute distress Filed Vitals:   02/05/13 1610  BP: 159/65  Pulse: 66  Temp: 97.7 F (36.5 C)  Resp: 20     Body mass index is 20.25 kg/(m^2).    ECOG FS: 2  Sclerae  unicteric Oropharynx clear No cervical or supraclavicular adenopathy Lungs no rales or rhonchi Heart regular rate and rhythm Abd benign MSK no focal spinal tenderness Neuro: non-focal, w oriented to person, place and year; pleasant affect Breasts: The right breast is status post lumpectomy. There is no evidence of local recurrence. The right axilla is benign. The left breast is unremarkable.   LAB RESULTS:  CMP     Component Value Date/Time   NA 141 02/05/2013 1551   NA 135 01/02/2013 1226   K 4.3 02/05/2013 1551   K 4.6 01/02/2013 1226   CL 96 01/02/2013 1226   CO2 30* 02/05/2013 1551   CO2 30 01/02/2013 1226   GLUCOSE 100 02/05/2013 1551   GLUCOSE 111* 01/02/2013 1226   BUN 42.9*  02/05/2013 1551   BUN 34* 01/02/2013 1226   CREATININE 1.9* 02/05/2013 1551   CREATININE 1.6* 01/02/2013 1226   CALCIUM 9.6 02/05/2013 1551   CALCIUM 9.2 01/02/2013 1226   PROT 7.5 02/05/2013 1551   ALBUMIN 3.7 02/05/2013 1551   AST 26 02/05/2013 1551   ALT 19 02/05/2013 1551   ALKPHOS 76 02/05/2013 1551   BILITOT 0.21 02/05/2013 1551    I No results found for this basename: SPEP, UPEP,  kappa and lambda light chains    Lab Results  Component Value Date   WBC 12.2* 02/05/2013   NEUTROABS 7.8* 02/05/2013   HGB 12.7 02/05/2013   HCT 37.9 02/05/2013   MCV 92.7 02/05/2013   PLT 211 02/05/2013      Chemistry      Component Value Date/Time   NA 141 02/05/2013 1551   NA 135 01/02/2013 1226   K 4.3 02/05/2013 1551   K 4.6 01/02/2013 1226   CL 96 01/02/2013 1226   CO2 30* 02/05/2013 1551   CO2 30 01/02/2013 1226   BUN 42.9* 02/05/2013 1551   BUN 34* 01/02/2013 1226   CREATININE 1.9* 02/05/2013 1551   CREATININE 1.6* 01/02/2013 1226      Component Value Date/Time   CALCIUM 9.6 02/05/2013 1551   CALCIUM 9.2 01/02/2013 1226   ALKPHOS 76 02/05/2013 1551   AST 26 02/05/2013 1551   ALT 19 02/05/2013 1551   BILITOT 0.21 02/05/2013 1551       No results found for this basename: LABCA2    No components found with this  basename: LABCA125    No results found for this basename: INR,  in the last 168 hours  Urinalysis No results found for this basename: colorurine, appearanceur, labspec, phurine, glucoseu, hgbur, bilirubinur, ketonesur, proteinur, urobilinogen, nitrite, leukocytesur    STUDIES: No results found.  ASSESSMENT: 79 y.o. woman recently moved to [facility], status post right breast upper inner quadrant biopsy may 08/06/2012 for a clinical T2 N0, stage IIA invasive ductal carcinoma, grade 2, prognostic panel not available  (1) status post definitive surgery 12/14/2012 for a pT2 pNX invasive ductal carcinoma, triple negative, with negative margins.  PLAN: We spent the better part of today's hour-long visit discussing the biology of breast cancer in general and the specifics of Mrs. Biby's situation. At the time of the patient's visit, we did not have the pathology report from [facility], but this was subsequently obtained and it shows her tumor to have been triple negative. I am separately writing the patient a letter with this information.  Basilia and her daughter understand that we have very little data for chemotherapy in patients over 80. Given her multiple other medical problems, the benefit of adjuvant chemotherapy, if any, is likely to be marginal. Accordingly, no adjuvant chemotherapy is planned.  I have scheduled her to return to see [place] in November, and then will see her again in May after her next mammography. Thereafter I will see her on a once a year basis assuming there are no new developments.    Lowella Dell, MD   02/10/2013 10:11 AM

## 2013-02-15 ENCOUNTER — Emergency Department (HOSPITAL_COMMUNITY): Payer: Medicare Other

## 2013-02-15 ENCOUNTER — Encounter (HOSPITAL_COMMUNITY): Payer: Self-pay | Admitting: Adult Health

## 2013-02-15 ENCOUNTER — Inpatient Hospital Stay (HOSPITAL_COMMUNITY)
Admission: EM | Admit: 2013-02-15 | Discharge: 2013-02-17 | DRG: 392 | Disposition: A | Discharge status: Home / Self Care | Payer: Medicare Other | Attending: Internal Medicine | Admitting: Internal Medicine

## 2013-02-15 DIAGNOSIS — Z87891 Personal history of nicotine dependence: Secondary | ICD-10-CM

## 2013-02-15 DIAGNOSIS — R195 Other fecal abnormalities: Secondary | ICD-10-CM

## 2013-02-15 DIAGNOSIS — N189 Chronic kidney disease, unspecified: Secondary | ICD-10-CM | POA: Diagnosis present

## 2013-02-15 DIAGNOSIS — E039 Hypothyroidism, unspecified: Secondary | ICD-10-CM

## 2013-02-15 DIAGNOSIS — I739 Peripheral vascular disease, unspecified: Secondary | ICD-10-CM

## 2013-02-15 DIAGNOSIS — K5289 Other specified noninfective gastroenteritis and colitis: Principal | ICD-10-CM | POA: Diagnosis present

## 2013-02-15 DIAGNOSIS — I10 Essential (primary) hypertension: Secondary | ICD-10-CM

## 2013-02-15 DIAGNOSIS — J449 Chronic obstructive pulmonary disease, unspecified: Secondary | ICD-10-CM | POA: Diagnosis present

## 2013-02-15 DIAGNOSIS — N184 Chronic kidney disease, stage 4 (severe): Secondary | ICD-10-CM

## 2013-02-15 DIAGNOSIS — D509 Iron deficiency anemia, unspecified: Secondary | ICD-10-CM

## 2013-02-15 DIAGNOSIS — I4891 Unspecified atrial fibrillation: Secondary | ICD-10-CM

## 2013-02-15 DIAGNOSIS — K921 Melena: Secondary | ICD-10-CM | POA: Diagnosis present

## 2013-02-15 DIAGNOSIS — K219 Gastro-esophageal reflux disease without esophagitis: Secondary | ICD-10-CM

## 2013-02-15 DIAGNOSIS — F0390 Unspecified dementia without behavioral disturbance: Secondary | ICD-10-CM

## 2013-02-15 DIAGNOSIS — D72829 Elevated white blood cell count, unspecified: Secondary | ICD-10-CM

## 2013-02-15 DIAGNOSIS — N390 Urinary tract infection, site not specified: Secondary | ICD-10-CM | POA: Diagnosis present

## 2013-02-15 DIAGNOSIS — N183 Chronic kidney disease, stage 3 unspecified: Secondary | ICD-10-CM

## 2013-02-15 DIAGNOSIS — I129 Hypertensive chronic kidney disease with stage 1 through stage 4 chronic kidney disease, or unspecified chronic kidney disease: Secondary | ICD-10-CM | POA: Diagnosis present

## 2013-02-15 DIAGNOSIS — C50919 Malignant neoplasm of unspecified site of unspecified female breast: Secondary | ICD-10-CM

## 2013-02-15 DIAGNOSIS — R1013 Epigastric pain: Secondary | ICD-10-CM

## 2013-02-15 DIAGNOSIS — K529 Noninfective gastroenteritis and colitis, unspecified: Secondary | ICD-10-CM

## 2013-02-15 DIAGNOSIS — M810 Age-related osteoporosis without current pathological fracture: Secondary | ICD-10-CM

## 2013-02-15 DIAGNOSIS — R112 Nausea with vomiting, unspecified: Secondary | ICD-10-CM | POA: Diagnosis present

## 2013-02-15 DIAGNOSIS — Z7901 Long term (current) use of anticoagulants: Secondary | ICD-10-CM

## 2013-02-15 DIAGNOSIS — R197 Diarrhea, unspecified: Secondary | ICD-10-CM

## 2013-02-15 DIAGNOSIS — G309 Alzheimer's disease, unspecified: Secondary | ICD-10-CM

## 2013-02-15 DIAGNOSIS — R1115 Cyclical vomiting syndrome unrelated to migraine: Secondary | ICD-10-CM

## 2013-02-15 DIAGNOSIS — E785 Hyperlipidemia, unspecified: Secondary | ICD-10-CM

## 2013-02-15 DIAGNOSIS — Z8679 Personal history of other diseases of the circulatory system: Secondary | ICD-10-CM

## 2013-02-15 DIAGNOSIS — J4489 Other specified chronic obstructive pulmonary disease: Secondary | ICD-10-CM | POA: Diagnosis present

## 2013-02-15 DIAGNOSIS — F0280 Dementia in other diseases classified elsewhere without behavioral disturbance: Secondary | ICD-10-CM

## 2013-02-15 DIAGNOSIS — I4892 Unspecified atrial flutter: Secondary | ICD-10-CM | POA: Diagnosis present

## 2013-02-15 LAB — COMPREHENSIVE METABOLIC PANEL
ALT: 17 U/L (ref 0–35)
Alkaline Phosphatase: 78 U/L (ref 39–117)
CO2: 27 mEq/L (ref 19–32)
Chloride: 98 mEq/L (ref 96–112)
GFR calc Af Amer: 29 mL/min — ABNORMAL LOW (ref >90)
GFR calc non Af Amer: 25 mL/min — ABNORMAL LOW (ref >90)
Glucose, Bld: 160 mg/dL — ABNORMAL HIGH (ref 70–99)
Potassium: 4.5 mEq/L (ref 3.5–5.1)
Sodium: 138 mEq/L (ref 135–145)
Total Bilirubin: 0.3 mg/dL (ref 0.3–1.2)

## 2013-02-15 LAB — CBC
HCT: 38.3 % (ref 36.0–46.0)
MCV: 92.5 fL (ref 78.0–100.0)
RDW: 13.1 % (ref 11.5–15.5)
WBC: 22 10*3/uL — ABNORMAL HIGH (ref 4.0–10.5)

## 2013-02-15 LAB — CBC WITH DIFFERENTIAL/PLATELET
Eosinophils Relative: 2 % (ref 0–5)
Lymphocytes Relative: 12 % (ref 12–46)
Lymphs Abs: 2.3 10*3/uL (ref 0.7–4.0)
MCV: 91.9 fL (ref 78.0–100.0)
Neutro Abs: 15.4 10*3/uL — ABNORMAL HIGH (ref 1.7–7.7)
Neutrophils Relative %: 79 % — ABNORMAL HIGH (ref 43–77)
Platelets: 200 10*3/uL (ref 150–400)
RBC: 4.56 MIL/uL (ref 3.87–5.11)
WBC: 19.4 10*3/uL — ABNORMAL HIGH (ref 4.0–10.5)

## 2013-02-15 LAB — OCCULT BLOOD, POC DEVICE: Fecal Occult Bld: POSITIVE — AB

## 2013-02-15 LAB — GASTRIC OCCULT BLOOD (1-CARD TO LAB)

## 2013-02-15 LAB — ABO/RH: ABO/RH(D): O POS

## 2013-02-15 MED ORDER — SODIUM CHLORIDE 0.9 % IJ SOLN
3.0000 mL | Freq: Two times a day (BID) | INTRAMUSCULAR | Status: DC
  Administered 2013-02-16 – 2013-02-17 (×3): 3 mL via INTRAVENOUS

## 2013-02-15 MED ORDER — POLYSACCHARIDE IRON COMPLEX 150 MG PO CAPS
150.0000 mg | ORAL_CAPSULE | Freq: Every day | ORAL | Status: DC
  Administered 2013-02-16 – 2013-02-17 (×2): 150 mg via ORAL
  Filled 2013-02-15 (×2): qty 1

## 2013-02-15 MED ORDER — BIOTENE DRY MOUTH MT LIQD
15.0000 mL | Freq: Two times a day (BID) | OROMUCOSAL | Status: DC
  Administered 2013-02-16 (×2): 15 mL via OROMUCOSAL

## 2013-02-15 MED ORDER — ACETAMINOPHEN 650 MG RE SUPP: 650.0000 mg | Freq: Four times a day (QID) | RECTAL | Status: DC | PRN

## 2013-02-15 MED ORDER — PANTOPRAZOLE SODIUM 40 MG IV SOLR
40.0000 mg | Freq: Two times a day (BID) | INTRAVENOUS | Status: DC
  Administered 2013-02-16 – 2013-02-17 (×3): 40 mg via INTRAVENOUS
  Filled 2013-02-15 (×5): qty 40

## 2013-02-15 MED ORDER — BISACODYL 10 MG RE SUPP
10.0000 mg | Freq: Once | RECTAL | Status: AC
  Administered 2013-02-16: 10 mg via RECTAL
  Filled 2013-02-15: qty 1

## 2013-02-15 MED ORDER — AMLODIPINE BESYLATE 5 MG PO TABS
5.0000 mg | ORAL_TABLET | Freq: Every day | ORAL | Status: DC
  Administered 2013-02-16 – 2013-02-17 (×2): 5 mg via ORAL
  Filled 2013-02-15 (×2): qty 1

## 2013-02-15 MED ORDER — SODIUM CHLORIDE 0.9 % IV SOLN
INTRAVENOUS | Status: AC
  Administered 2013-02-16: via INTRAVENOUS

## 2013-02-15 MED ORDER — CHLORHEXIDINE GLUCONATE 0.12 % MT SOLN
15.0000 mL | Freq: Two times a day (BID) | OROMUCOSAL | Status: DC
  Administered 2013-02-16 (×3): 15 mL via OROMUCOSAL
  Filled 2013-02-15 (×6): qty 15

## 2013-02-15 MED ORDER — ACETAMINOPHEN 325 MG PO TABS: 650.0000 mg | ORAL_TABLET | Freq: Four times a day (QID) | ORAL | Status: DC | PRN

## 2013-02-15 MED ORDER — CLONIDINE HCL 0.1 MG PO TABS
0.0500 mg | ORAL_TABLET | Freq: Every evening | ORAL | Status: DC
  Administered 2013-02-16: 0.05 mg via ORAL
  Filled 2013-02-15 (×2): qty 0.5

## 2013-02-15 MED ORDER — MOMETASONE FURO-FORMOTEROL FUM 100-5 MCG/ACT IN AERO
2.0000 | INHALATION_SPRAY | Freq: Two times a day (BID) | RESPIRATORY_TRACT | Status: DC
  Administered 2013-02-16 – 2013-02-17 (×4): 2 via RESPIRATORY_TRACT
  Filled 2013-02-15: qty 8.8

## 2013-02-15 MED ORDER — METOPROLOL TARTRATE 100 MG PO TABS
100.0000 mg | ORAL_TABLET | Freq: Two times a day (BID) | ORAL | Status: DC
  Administered 2013-02-16 – 2013-02-17 (×4): 100 mg via ORAL
  Filled 2013-02-15 (×5): qty 1

## 2013-02-15 MED ORDER — FUROSEMIDE 40 MG PO TABS
40.0000 mg | ORAL_TABLET | Freq: Every day | ORAL | Status: DC
  Administered 2013-02-16 – 2013-02-17 (×2): 40 mg via ORAL
  Filled 2013-02-15 (×2): qty 1

## 2013-02-15 MED ORDER — SIMVASTATIN 40 MG PO TABS
40.0000 mg | ORAL_TABLET | Freq: Every evening | ORAL | Status: DC
  Filled 2013-02-15: qty 1

## 2013-02-15 MED ORDER — IOHEXOL 300 MG/ML  SOLN
25.0000 mL | INTRAMUSCULAR | Status: DC
  Administered 2013-02-15: 25 mL via ORAL

## 2013-02-15 MED ORDER — METOCLOPRAMIDE HCL 5 MG/ML IJ SOLN
10.0000 mg | Freq: Once | INTRAMUSCULAR | Status: AC
  Administered 2013-02-15: 10 mg via INTRAVENOUS
  Filled 2013-02-15: qty 2

## 2013-02-15 MED ORDER — HYDROMORPHONE HCL PF 1 MG/ML IJ SOLN
0.5000 mg | Freq: Once | INTRAMUSCULAR | Status: AC
  Administered 2013-02-15: 0.5 mg via INTRAVENOUS
  Filled 2013-02-15: qty 1

## 2013-02-15 MED ORDER — LEVOTHYROXINE SODIUM 75 MCG PO TABS
37.5000 ug | ORAL_TABLET | Freq: Every day | ORAL | Status: DC
  Administered 2013-02-16 – 2013-02-17 (×2): 37.5 ug via ORAL
  Filled 2013-02-15 (×4): qty 0.5

## 2013-02-15 NOTE — ED Notes (Signed)
Pt unable to do the standing for orthostatic vitals. RN notified. Pt also got sick once she got back into bed. RN in room to witness

## 2013-02-15 NOTE — ED Provider Notes (Signed)
CSN: [id number]     Arrival date & time 02/15/13  1929 History     First MD Initiated Contact with Patient 02/15/13 1948     Chief Complaint  Patient presents with  . Diarrhea   (Consider location/radiation/quality/duration/timing/severity/associated sxs/prior Treatment) Patient is a 79 y.o. female presenting with diarrhea. The history is provided by the patient and a relative.  Diarrhea Quality:  Black and tarry Severity:  Severe Onset quality:  Sudden Timing:  Intermittent Progression:  Unchanged Relieved by:  None tried Worsened by:  Nothing tried Ineffective treatments:  None tried Associated symptoms: chills and vomiting   Associated symptoms: no abdominal pain, no diaphoresis, no fever and no headaches     Past Medical History  Diagnosis Date  . Status post AAA (abdominal aortic aneurysm) repair 01/02/2013  . PAD (peripheral artery disease) 01/02/2013  . Osteoporosis, unspecified 01/02/2013  . Hyperlipemia 01/02/2013  . Hx of atrial flutter, on Xarelto 01/02/2013  . Essential hypertension, benign 01/02/2013  . Chronic kidney disease 01/02/2013  . Breast cancer 01/02/2013  . COPD (chronic obstructive pulmonary disease) 01/02/2013  . Dementia, on Namenda briefly in the past per review of records 01/02/2013  . GERD (gastroesophageal reflux disease) 01/02/2013  . Iron deficiency anemia 01/02/2013  . Unspecified hypothyroidism 01/02/2013   Past Surgical History  Procedure Laterality Date  . Abdominal aortic aneurysm repair    . Breast surgery      right lumpectomy   Family History  Problem Relation Age of Onset  . Cancer Mother     lung  . Heart disease Father 80   History  Substance Use Topics  . Smoking status: Former Games developer  . Smokeless tobacco: Never Used  . Alcohol Use: No   OB History   Grav Para Term Preterm Abortions TAB SAB Ect Mult Living                 Review of Systems  Constitutional: Positive for chills and appetite change. Negative for fever,  diaphoresis and fatigue.  HENT: Negative for ear pain, congestion, sore throat, facial swelling, mouth sores, trouble swallowing, neck pain and neck stiffness.   Eyes: Negative.   Respiratory: Negative for apnea, cough, chest tightness, shortness of breath and wheezing.   Cardiovascular: Negative for chest pain, palpitations and leg swelling.  Gastrointestinal: Positive for nausea, vomiting, diarrhea and blood in stool. Negative for abdominal pain and abdominal distention.  Genitourinary: Negative for hematuria, flank pain, vaginal discharge, difficulty urinating and menstrual problem.  Musculoskeletal: Negative for back pain and gait problem.  Skin: Negative for rash and wound.  Neurological: Negative for dizziness, tremors, seizures, syncope, facial asymmetry, numbness and headaches.  Psychiatric/Behavioral: Negative.   All other systems reviewed and are negative.    Allergies  Review of patient's allergies indicates no known allergies.  Home Medications   No current outpatient prescriptions on file. BP 160/62  Pulse 89  Temp(Src) 97.6 F (36.4 C) (Oral)  Resp 15  SpO2 95% Physical Exam  Nursing note and vitals reviewed. Constitutional: She is oriented to person, place, and time. She appears well-developed and well-nourished. No distress.  HENT:  Head: Normocephalic and atraumatic.  Right Ear: External ear normal.  Left Ear: External ear normal.  Nose: Nose normal.  Mouth/Throat: Oropharynx is clear and moist. No oropharyngeal exudate.  Eyes: Conjunctivae and EOM are normal. Pupils are equal, round, and reactive to light. Right eye exhibits no discharge. Left eye exhibits no discharge.  Neck: Normal range of motion. Neck  supple. No JVD present. No tracheal deviation present. No thyromegaly present.  Cardiovascular: Normal rate, regular rhythm, normal heart sounds and intact distal pulses.  Exam reveals no gallop and no friction rub.   No murmur heard. Pulmonary/Chest: Effort  normal and breath sounds normal. No respiratory distress. She has no wheezes. She has no rales. She exhibits no tenderness.  Abdominal: Soft. Bowel sounds are normal. She exhibits no distension. There is tenderness in the epigastric area. There is no rigidity, no rebound and no guarding.  Diffuse abdominal pain that is worse in the epigastrium  Genitourinary: Rectal exam shows no external hemorrhoid, no internal hemorrhoid, no fissure, no mass, no tenderness and anal tone normal. Guaiac positive stool.  Musculoskeletal: Normal range of motion.  Lymphadenopathy:    She has no cervical adenopathy.  Neurological: She is alert and oriented to person, place, and time. No cranial nerve deficit. Coordination normal.  Skin: Skin is warm. No rash noted. She is not diaphoretic.  Psychiatric: She has a normal mood and affect. Her behavior is normal. Judgment and thought content normal.    ED Course   Procedures (including critical care time)  Labs Reviewed  CBC WITH DIFFERENTIAL - Abnormal; Notable for the following:    WBC 19.4 (*)    Neutrophils Relative % 79 (*)    Neutro Abs 15.4 (*)    Monocytes Absolute 1.3 (*)    All other components within normal limits  COMPREHENSIVE METABOLIC PANEL - Abnormal; Notable for the following:    Glucose, Bld 160 (*)    BUN 42 (*)    Creatinine, Ser 1.75 (*)    GFR calc non Af Amer 25 (*)    GFR calc Af Amer 29 (*)    All other components within normal limits  OCCULT BLOOD, POC DEVICE - Abnormal; Notable for the following:    Fecal Occult Bld POSITIVE (*)    All other components within normal limits  PROTIME-INR  PH, GASTRIC FLUID (GASTROCCULT CARD)  CBC  BASIC METABOLIC PANEL  PROTIME-INR  POCT GASTRIC OCCULT BLOOD (1-CARD TO LAB)  TYPE AND SCREEN  ABO/RH   Ct Abdomen Pelvis Wo Contrast  02/15/2013   *RADIOLOGY REPORT*  Clinical Data: 79 year old female with abdominal and pelvic pain, nausea, vomiting and diarrhea.  CT ABDOMEN AND PELVIS WITHOUT  CONTRAST  Technique:  Multidetector CT imaging of the abdomen and pelvis was performed following the standard protocol without intravenous contrast.  Comparison: None  Findings: The lung bases are clear.  The liver, spleen, adrenal glands, pancreas and gallbladder are unremarkable. Right renal atrophy and left renal cyst are identified. Please note that parenchymal abnormalities may be missed as intravenous contrast was not administered.  There is no evidence of free fluid, enlarged lymph nodes or biliary dilatation. An abdominal aortic - biiliac stent graft is noted.  A large amount of rectal stool is noted. Descending and sigmoid colonic diverticulosis noted without evidence of diverticulitis. There is no evidence of bowel obstruction or pneumoperitoneum.  The bladder, uterus and adnexal regions are within normal limits.  No acute or suspicious bony abnormalities are noted.  IMPRESSION: No evidence of acute abnormality.  Large amount of rectal stool.   Original Report Authenticated By: Harmon Pier, M.D.   1. Diarrhea   2. Melena   3. Leukocytosis     MDM  79 yr old F patient with extensive PMH which includes abdominal aortic aneurysm s/p repair presents with diarrhea and vomiting. Blood in stool here. Patient with  diffuse abdominal pain, worse in epigastric area. Has one sick contact. Melena. I did bedside abdominal US and visualized the entire abdominal aorta. There is no part greater than 3 cm and no fluid around it. Given hx of aorta repair concern for possible aortoenteric fistula versus intraabdominal process like diverticulitis or appy. Will also get EKG. Will help treat nausea and pain.  Patient with normal CT of the abdomen without contrast. Because of the continued nausea and inability to take PO with the leukocytosis and melena will admit to the hospital.  Case discussed with Dr. Ermalene Postin, MD 02/15/13 [id number]

## 2013-02-15 NOTE — Progress Notes (Signed)
Pt arrived from ED via stretcher. Pt was put into bed, oriented to her room, and VS have been taken. Pt in no apparent distress at this time. rn will continue to monitor pt.

## 2013-02-15 NOTE — ED Notes (Signed)
Dr. Allena Katz states OK to give water to pt. Pt given cup of water

## 2013-02-15 NOTE — ED Notes (Signed)
Gastroccult card taken to lab.

## 2013-02-15 NOTE — ED Notes (Addendum)
MD at bedside. 

## 2013-02-15 NOTE — ED Notes (Signed)
Dr. Patel at bedside 

## 2013-02-15 NOTE — ED Notes (Signed)
Presents pale, weak, c/o diarrhea for one day black in color, vomiting and abdominal distention and pain. Pt takes xarelto.

## 2013-02-16 ENCOUNTER — Inpatient Hospital Stay (HOSPITAL_COMMUNITY): Payer: Medicare Other

## 2013-02-16 ENCOUNTER — Encounter (HOSPITAL_COMMUNITY): Payer: Self-pay | Admitting: Internal Medicine

## 2013-02-16 DIAGNOSIS — K5289 Other specified noninfective gastroenteritis and colitis: Principal | ICD-10-CM

## 2013-02-16 DIAGNOSIS — Z8679 Personal history of other diseases of the circulatory system: Secondary | ICD-10-CM

## 2013-02-16 DIAGNOSIS — R1013 Epigastric pain: Secondary | ICD-10-CM | POA: Diagnosis present

## 2013-02-16 DIAGNOSIS — R112 Nausea with vomiting, unspecified: Secondary | ICD-10-CM | POA: Diagnosis present

## 2013-02-16 DIAGNOSIS — R195 Other fecal abnormalities: Secondary | ICD-10-CM

## 2013-02-16 DIAGNOSIS — K921 Melena: Secondary | ICD-10-CM | POA: Diagnosis present

## 2013-02-16 DIAGNOSIS — R197 Diarrhea, unspecified: Secondary | ICD-10-CM

## 2013-02-16 DIAGNOSIS — K529 Noninfective gastroenteritis and colitis, unspecified: Secondary | ICD-10-CM | POA: Diagnosis present

## 2013-02-16 DIAGNOSIS — E785 Hyperlipidemia, unspecified: Secondary | ICD-10-CM

## 2013-02-16 LAB — URINALYSIS, ROUTINE W REFLEX MICROSCOPIC
Nitrite: NEGATIVE
Specific Gravity, Urine: 1.019 (ref 1.005–1.030)
Urobilinogen, UA: 0.2 mg/dL (ref 0.0–1.0)
pH: 6.5 (ref 5.0–8.0)

## 2013-02-16 LAB — BASIC METABOLIC PANEL
BUN: 44 mg/dL — ABNORMAL HIGH (ref 6–23)
CO2: 27 mEq/L (ref 19–32)
Chloride: 100 mEq/L (ref 96–112)
Creatinine, Ser: 1.67 mg/dL — ABNORMAL HIGH (ref 0.50–1.10)
GFR calc Af Amer: 30 mL/min — ABNORMAL LOW (ref >90)
Glucose, Bld: 153 mg/dL — ABNORMAL HIGH (ref 70–99)

## 2013-02-16 LAB — CBC
MCH: 31.1 pg (ref 26.0–34.0)
MCHC: 33.5 g/dL (ref 30.0–36.0)
MCV: 92.9 fL (ref 78.0–100.0)
Platelets: 179 10*3/uL (ref 150–400)
RDW: 13.3 % (ref 11.5–15.5)

## 2013-02-16 LAB — URINE MICROSCOPIC-ADD ON

## 2013-02-16 MED ORDER — ATORVASTATIN CALCIUM 20 MG PO TABS
20.0000 mg | ORAL_TABLET | Freq: Every day | ORAL | Status: DC
  Administered 2013-02-16: 20 mg via ORAL
  Filled 2013-02-16 (×2): qty 1

## 2013-02-16 MED ORDER — ONDANSETRON HCL 4 MG/2ML IJ SOLN: 4.0000 mg | Freq: Four times a day (QID) | INTRAMUSCULAR | Status: DC | PRN

## 2013-02-16 MED ORDER — DEXTROSE 5 % IV SOLN
1.0000 g | INTRAVENOUS | Status: DC
  Administered 2013-02-16: 1 g via INTRAVENOUS
  Filled 2013-02-16 (×3): qty 10

## 2013-02-16 NOTE — Progress Notes (Signed)
PATIENT DETAILS Name: Caroline Underwood Age: 79 y.o. Sex: female Date of Birth: 04/08/1922 Admit Date: 02/15/2013 Admitting Physician Lynden Oxford, MD PCP:KIM, Damita Lack., DO  Subjective: No further abd pain, nausea or vomiting. No further black tarry stools  Assessment/Plan: Principal Problem: Abdominal Pain/Vomiting/Diarrhea with black stools. -not sure what the exact etiology is-however given elevated WBC-suspect that she may have a either a viral syndrome with subsequent sloughing of mucosa and transient GI bleed, or an ischemic colitis -unfortunately given dementia-unable to get more history -she was on Xarelto, was held on admission-will resume once sure ?GI bleed issues resolved -will get GI consultation as well.  Active Problems: Essential hypertension, benign -controlled -c/w Amlodipine, Metoprolol and clonidine   Hx of atrial flutter -Xarelto on hold -on Metoprolol  Hyperlipemia -c/w Lipitor  Chronic kidney disease Stage III -at baseline  Dementia  -pleasantly confused-at baseline  Disposition: Remain inpatient  DVT Prophylaxis:  SCD's  Code Status: Full code   Family Communication Daughter/Son at bedside  Procedures:  None  CONSULTS:  GI   MEDICATIONS: Scheduled Meds: . amLODipine  5 mg Oral Daily  . antiseptic oral rinse  15 mL Mouth Rinse q12n4p  . atorvastatin  20 mg Oral q1800  . chlorhexidine  15 mL Mouth Rinse BID  . cloNIDine  0.05 mg Oral QPM  . furosemide  40 mg Oral Daily  . iron polysaccharides  150 mg Oral Daily  . levothyroxine  37.5 mcg Oral QAC breakfast  . metoprolol  100 mg Oral BID  . mometasone-formoterol  2 puff Inhalation BID  . pantoprazole (PROTONIX) IV  40 mg Intravenous Q12H  . sodium chloride  3 mL Intravenous Q12H   Continuous Infusions:  PRN Meds:.acetaminophen, acetaminophen, ondansetron  Antibiotics: Anti-infectives   None       PHYSICAL EXAM: Vital signs in last 24 hours: Filed Vitals:   02/16/13 0058 02/16/13 0557 02/16/13 0911 02/16/13 1422  BP:  113/56 130/68 157/69  Pulse:  67  64  Temp:  98.6 F (37 C)  97.6 F (36.4 C)  TempSrc:  Oral  Oral  Resp:  20  20  Height:      Weight:      SpO2: 98% 96%  96%    Weight change:  Filed Weights   02/15/13 2312  Weight: 55.6 kg (122 lb 9.2 oz)   Body mass index is 20.4 kg/(m^2).   Gen Exam: Awake and confused,with clear speech.   Neck: Supple, No JVD.   Chest: B/L Clear.   CVS: S1 S2 Regular, no murmurs.  Abdomen: soft, BS +, non tender, non distended. Extremities: no edema, lower extremities warm to touch. Neurologic: Non Focal.   Skin: No Rash.   Wounds: N/A.    Intake/Output from previous day:  Intake/Output Summary (Last 24 hours) at 02/16/13 1437 Last data filed at 02/16/13 1158  Gross per 24 hour  Intake      0 ml  Output    300 ml  Net   -300 ml     LAB RESULTS: CBC  Recent Labs Lab 02/15/13 1939 02/15/13 2338 02/16/13 0744  WBC 19.4* 22.0* 19.1*  HGB 14.5 13.1 11.8*  HCT 41.9 38.3 35.2*  PLT 200 157 179  MCV 91.9 92.5 92.9  MCH 31.8 31.6 31.1  MCHC 34.6 34.2 33.5  RDW 13.2 13.1 13.3  LYMPHSABS 2.3  --   --   MONOABS 1.3*  --   --   EOSABS 0.4  --   --  BASOSABS 0.1  --   --     Chemistries   Recent Labs Lab 02/15/13 1939 02/16/13 0525  NA 138 137  K 4.5 4.5  CL 98 100  CO2 27 27  GLUCOSE 160* 153*  BUN 42* 44*  CREATININE 1.75* 1.67*  CALCIUM 10.0 9.2    CBG: No results found for this basename: GLUCAP,  in the last 168 hours  GFR Estimated Creatinine Clearance: 20.4 ml/min (by C-G formula based on Cr of 1.67).  Coagulation profile  Recent Labs Lab 02/15/13 1939 02/16/13 0525  INR 1.01 2.38*    Cardiac Enzymes No results found for this basename: CK, CKMB, TROPONINI, MYOGLOBIN,  in the last 168 hours  No components found with this basename: POCBNP,  No results found for this basename: DDIMER,  in the last 72 hours No results found for this basename:  HGBA1C,  in the last 72 hours No results found for this basename: CHOL, HDL, LDLCALC, TRIG, CHOLHDL, LDLDIRECT,  in the last 72 hours No results found for this basename: TSH, T4TOTAL, FREET3, T3FREE, THYROIDAB,  in the last 72 hours No results found for this basename: VITAMINB12, FOLATE, FERRITIN, TIBC, IRON, RETICCTPCT,  in the last 72 hours No results found for this basename: LIPASE, AMYLASE,  in the last 72 hours  Urine Studies No results found for this basename: UACOL, UAPR, USPG, UPH, UTP, UGL, UKET, UBIL, UHGB, UNIT, UROB, ULEU, UEPI, UWBC, URBC, UBAC, CAST, CRYS, UCOM, BILUA,  in the last 72 hours  MICROBIOLOGY: No results found for this or any previous visit (from the past 240 hour(s)).  RADIOLOGY STUDIES/RESULTS: Ct Abdomen Pelvis Wo Contrast  02/15/2013   *RADIOLOGY REPORT*  Clinical Data: 79 year old female with abdominal and pelvic pain, nausea, vomiting and diarrhea.  CT ABDOMEN AND PELVIS WITHOUT CONTRAST  Technique:  Multidetector CT imaging of the abdomen and pelvis was performed following the standard protocol without intravenous contrast.  Comparison: None  Findings: The lung bases are clear.  The liver, spleen, adrenal glands, pancreas and gallbladder are unremarkable. Right renal atrophy and left renal cyst are identified. Please note that parenchymal abnormalities may be missed as intravenous contrast was not administered.  There is no evidence of free fluid, enlarged lymph nodes or biliary dilatation. An abdominal aortic - biiliac stent graft is noted.  A large amount of rectal stool is noted. Descending and sigmoid colonic diverticulosis noted without evidence of diverticulitis. There is no evidence of bowel obstruction or pneumoperitoneum.  The bladder, uterus and adnexal regions are within normal limits.  No acute or suspicious bony abnormalities are noted.  IMPRESSION: No evidence of acute abnormality.  Large amount of rectal stool.   Original Report Authenticated By: Harmon Pier, M.D.   Dg Chest Port 1 View  02/16/2013   *RADIOLOGY REPORT*  Clinical Data: Leukocytosis.  PORTABLE CHEST - 1 VIEW  Comparison: No priors.  Findings: Lung volumes are normal.  No consolidative airspace disease.  No pleural effusions.  No pneumothorax.  No pulmonary nodule or mass noted.  Pulmonary vasculature and the cardiomediastinal silhouette are within normal limits. Atherosclerosis in the thoracic aorta.  IMPRESSION: 1. No radiographic evidence of acute cardiopulmonary disease. 2.  Atherosclerosis.   Original Report Authenticated By: Trudie Reed, M.D.    Jeoffrey Massed, MD  [facility] Pager:[phone]  If 7PM-7AM, please contact night-coverage [email or web address] Password [facility] 02/16/2013, 2:37 PM   LOS: 1 day

## 2013-02-16 NOTE — ED Provider Notes (Signed)
I saw and evaluated the patient, reviewed the resident's note and I agree with the findings and plan.  The patient presents with complaints of diarrhea that is dark in color and vomiting.  She denies abdominal pain, fever, or urinary complaints.    On exam, the vitals are stable and the patient is afebrile.  The heart and lung exam are unremarkable and the lungs are clear.  The abdomen is soft.  There is ttp in the epigastric region.  There is no distension.  There are no pulsatile masses.  The extremities are without edema.  The workup reveals an elevated wbc of 19k along with an episode of melena while in the ED.  Her Hb is stable.  She has a history of AAA repair, but bedside US does not show signs of rupture or fistula.  CT does not show any acute intra-abdominal process.  Due to melanotic stools and abd pain, will admit to medicine.    Geoffery Lyons, MD 02/16/13 [phone]

## 2013-02-16 NOTE — H&P (Signed)
[facility] History and Physical  Caroline Underwood  XBJ:[id number]  DOB: [birth year]  DOA: 02/15/2013  Referring physician: Dr Judd Lien PCP: Terressa Koyanagi., DO   Chief Complaint: Nausea and vomiting with abdominal pain melena  HPI: Caroline Underwood is a 79 y.o. female with Past medical history of atrial flutter on Xarelto, AAA repair, hypertension, COPD, chronic kidney disease, GERD. she presented to ER with the complaint of sudden onset of abdominal pain, followed by nausea and vomiting into episode of diarrhea with black color bowel movements.  at present on my evaluation she denies any complaint of nausea vomiting abdominal pain. She denies any shortness of breath chest pain dizziness lightheadedness palpitation. Patient denies any similar episode before. Patient doesn't mentions that she was on Coumadin before but we do not have any records to confirm that.  As per the records she was started on xarelto for A. flutter when she was in [place] few month ago. At the time of my evaluation Pt denies any fever, chills, headache, cough, chest pain, palpitation, shortness of breath,orthopnea, PND, nausea, vomiting, abdominal pain, constipation, burning urination, dizziness, pedal edema,  focal neurological deficit.   Review of Systems: as mentioned in the history of present illness.  A Comprehensive review of the other systems is negative.  Past Medical History  Diagnosis Date  . Status post AAA (abdominal aortic aneurysm) repair 01/02/2013  . PAD (peripheral artery disease) 01/02/2013  . Osteoporosis, unspecified 01/02/2013  . Hyperlipemia 01/02/2013  . Hx of atrial flutter, on Xarelto 01/02/2013  . Essential hypertension, benign 01/02/2013  . Chronic kidney disease 01/02/2013  . Breast cancer 01/02/2013  . COPD (chronic obstructive pulmonary disease) 01/02/2013  . Dementia, on Namenda briefly in the past per review of records 01/02/2013  . GERD (gastroesophageal reflux disease) 01/02/2013   . Iron deficiency anemia 01/02/2013  . Unspecified hypothyroidism 01/02/2013   Past Surgical History  Procedure Laterality Date  . Abdominal aortic aneurysm repair    . Breast surgery      right lumpectomy   Social History:  reports that she quit smoking about 5 years ago. She has never used smokeless tobacco. She reports that she does not drink alcohol or use illicit drugs. Patient is coming from home. Patient can participate in ADLs.  No Known Allergies  Family History  Problem Relation Age of Onset  . Cancer Mother     lung  . Heart disease Father 47    Prior to Admission medications   Medication Sig Start Date End Date Taking? Authorizing Provider  amLODipine (NORVASC) 5 MG tablet Take 1 tablet (5 mg total) by mouth daily. 01/02/13  Yes Terressa Koyanagi, DO  aspirin 81 MG tablet Take 81 mg by mouth daily.   Yes Historical Provider, MD  cholecalciferol (VITAMIN D) 1000 UNITS tablet Take 1,000 Units by mouth once a week. On tuesday   Yes Historical Provider, MD  cloNIDine (CATAPRES) 0.1 MG tablet Take 0.05 mg by mouth every evening.   Yes Historical Provider, MD  Fluticasone-Salmeterol (ADVAIR) 100-50 MCG/DOSE AEPB Inhale 1 puff into the lungs every 12 (twelve) hours. 01/16/13  Yes Terressa Koyanagi, DO  furosemide (LASIX) 40 MG tablet Take 1 tablet (40 mg total) by mouth daily. 01/02/13  Yes Terressa Koyanagi, DO  iron polysaccharides (NIFEREX) 150 MG capsule Take 150 mg by mouth daily.   Yes Historical Provider, MD  lactulose (CHRONULAC) 10 GM/15ML solution 30 g. 30 ml every 4 hours as needed for constipation   Yes  Historical Provider, MD  levothyroxine (SYNTHROID) 25 MCG tablet Take 1.5 tablets (37.5 mcg total) by mouth daily before breakfast. 01/02/13  Yes Terressa Koyanagi, DO  Melatonin 3 MG TABS Take 3 mg by mouth at bedtime.   Yes Historical Provider, MD  metoprolol (LOPRESSOR) 100 MG tablet Take 1 tablet (100 mg total) by mouth 2 (two) times daily. 01/02/13  Yes Terressa Koyanagi, DO  potassium  chloride SA (K-DUR,KLOR-CON) 20 MEQ tablet Take 1 tablet (20 mEq total) by mouth daily. 01/24/13  Yes Terressa Koyanagi, DO  Rivaroxaban (XARELTO) 15 MG TABS tablet Take 15 mg by mouth daily.   Yes Historical Provider, MD  simvastatin (ZOCOR) 40 MG tablet Take 1 tablet (40 mg total) by mouth every evening. 01/02/13  Yes Terressa Koyanagi, DO  vitamin C (ASCORBIC ACID) 500 MG tablet Take 500 mg by mouth daily.   Yes Historical Provider, MD    Physical Exam: Filed Vitals:   02/15/13 2200 02/15/13 2230 02/15/13 2312 02/16/13 0015  BP: 163/70 160/62 154/72 155/72  Pulse: 88 89 88 90  Temp:   99.4 F (37.4 C)   TempSrc:   Oral   Resp: 18 15 18    Height:   5\' 5"  (1.651 m)   Weight:   55.6 kg (122 lb 9.2 oz)   SpO2: 95% 95% 99%     General: Alert, Awake and Oriented to Time, Place and Person, with occasional confusion. Appear in mild distress Eyes: PERRL ENT: Oral Mucosa clear moist. Neck: no JVD, no Carotid Bruits, no Stiffness Cardiovascular: S1 and S2 Present,  Systolic apical Murmur, Peripheral Pulses Present Respiratory: Bilateral Air entry equal and Decreased, Clear to Auscultation,  Abdomen: Bowel Sound Present, Soft and Non tender, Skin: no Rash Extremities: no Pedal edema, no calf tenderness Neurologic: Grossly Unremarkable.  Labs on Admission:  Basic Metabolic Panel:  Recent Labs Lab 02/15/13 1939  NA 138  K 4.5  CL 98  CO2 27  GLUCOSE 160*  BUN 42*  CREATININE 1.75*  CALCIUM 10.0   Liver Function Tests:  Recent Labs Lab 02/15/13 1939  AST 29  ALT 17  ALKPHOS 78  BILITOT 0.3  PROT 8.3  ALBUMIN 4.2   No results found for this basename: LIPASE, AMYLASE,  in the last 168 hours No results found for this basename: AMMONIA,  in the last 168 hours CBC:  Recent Labs Lab 02/15/13 1939 02/15/13 2338  WBC 19.4* 22.0*  NEUTROABS 15.4*  --   HGB 14.5 13.1  HCT 41.9 38.3  MCV 91.9 92.5  PLT 200 157   Cardiac Enzymes: No results found for this basename: CKTOTAL,  CKMB, CKMBINDEX, TROPONINI,  in the last 168 hours  BNP (last 3 results) No results found for this basename: PROBNP,  in the last 8760 hours CBG: No results found for this basename: GLUCAP,  in the last 168 hours  Radiological Exams on Admission: Ct Abdomen Pelvis Wo Contrast  02/15/2013   *RADIOLOGY REPORT*  Clinical Data: 79 year old female with abdominal and pelvic pain, nausea, vomiting and diarrhea.  CT ABDOMEN AND PELVIS WITHOUT CONTRAST  Technique:  Multidetector CT imaging of the abdomen and pelvis was performed following the standard protocol without intravenous contrast.  Comparison: None  Findings: The lung bases are clear.  The liver, spleen, adrenal glands, pancreas and gallbladder are unremarkable. Right renal atrophy and left renal cyst are identified. Please note that parenchymal abnormalities may be missed as intravenous contrast was not administered.  There is  no evidence of free fluid, enlarged lymph nodes or biliary dilatation. An abdominal aortic - biiliac stent graft is noted.  A large amount of rectal stool is noted. Descending and sigmoid colonic diverticulosis noted without evidence of diverticulitis. There is no evidence of bowel obstruction or pneumoperitoneum.  The bladder, uterus and adnexal regions are within normal limits.  No acute or suspicious bony abnormalities are noted.  IMPRESSION: No evidence of acute abnormality.  Large amount of rectal stool.   Original Report Authenticated By: Harmon Pier, M.D.    EKG: Independently reviewed. no ST-T wave changes suggestive of ischemia.  Assessment/Plan Principal Problem:   Melena Active Problems:   Essential hypertension, benign   Hx of atrial flutter, on Xarelto   Hyperlipemia   Chronic kidney disease   Dementia, on Namenda briefly in the past per review of records   Intractable nausea and vomiting   Abdominal pain, epigastric   Gastroenteritis   1. Melena Currently patient appears hemodynamically stable, she had  2 episodes of black color bowel movement, and occult blood testing is positive. She denies any acid reflux at present. Considering this we will admit the patient on telemetry. Follow serial H&H, give the patient IV Protonix every 12 hours.  Give the patient gentle hydration. Indication for anticoagulation is atrial flutter, we will hold anticoagulation for therapeutic purposes of present. The patient does not have any active bleeding Xarelto can be resumed. His H&H drops further or she has any more black bowel movements she may need Protonix infusion and GI consult  2. accelerated hypertension At present patient's blood pressure is slightly on the high side more likely secondary to not taking her medications today. We will continue her home medications.  3. Leukocytosis He should does not have any complaint at present. Her leukocytosis could likely be secondary to stress. CT scan is negative, patient does not have any respiratory complaint, we will obtain a UA and a chest x-ray. No antibiotics indicated at present.  4. chronic kidney disease Monitor BMP daily. Avoid nephrotoxic medications.  Possibly her bleeding on Xarelto could be do to her fluctuating renal function, since at this recommended to avoid it for GFR less than 15.  5. abdominal pain with nausea and vomiting  Etiology is unclear.  CT scan of the abdomen and pelvis as not showing any significant abnormality other than some stool in the rectum. We will provide a Dulcolax suppository for that. Patient's son also had recent similar episode 2 days ago. possibility of gastroenteritis this high on differential. we will send stool studies.  DVT Prophylaxismechanical compression device Nutrition:  n.p.o. with ice chips  Code Status:  full  Family Communication:  patient's daughter and son were at bedside and were explained about plan.   Author: Lynden Oxford, MD [facility] Pager: [phone] 02/16/2013 12:49 AM     If 7PM-7AM, please contact night-coverage [email or web address] Password [email or web address]

## 2013-02-16 NOTE — Progress Notes (Signed)
Pt a/o, no c/o pain, pt advanced to clear liquid diet, tolerating well, no c/o N/V, stool and urine sample sent, family at bedside, will continue to monitor

## 2013-02-16 NOTE — Consult Note (Signed)
Referring Provider: [facility] Primary Care Physician:  Terressa Koyanagi., DO Primary Gastroenterologist:  none  Reason for Consultation:  Abdominal pain, nausea, vomiting and black stool  HPI: Caroline Underwood is a 79 y.o. female who recently relocated from the [facility] area to live with her daughter. Patient has history of dementia which is felt probably Alzheimer's and had recently been placed on Namenda. She also has history of hypertension, chronic kidney disease, breast cancer, peripheral artery disease, COPD and hypothyroidism. She has an aortobiiliac graft in place. She was diagnosed with atrial fib/flutter apparently in the spring of 2014 in [place] and was placed on Xarelto at that time. She also apparently has history of iron deficiency anemia. There are iron studies which were sent from [place] showing a serum iron of 27 TIBC of 233 and an iron saturation from April of 2014. In May of 2014 her hemoglobin was 8.5 hematocrit of 26 MCV of 94. CBCs have been followed here and in mid June hemoglobin was 11.9 hematocrit of 35, on 02/05/2013 hemoglobin 12.7 hematocrit of 37.9. She has been on Niferex at home She apparently had acute onset yesterday morning with severe mid abdominal pain followed by 2-3 episodes of diarrhea which were described as tarry and black by her daughter. She became extremely weak and unable to get herself up off of the commode. She was apparently not diaphoretic but did have nausea and vomiting. There was  no evidence of blood in the emesis. She was brought to the emergency room due to the extreme weakness.  she has had 2 more episodes of diarrhea since admission . Today she feels much better denies any nausea, she denies any abdominal pain when asked and has not had any further diarrhea this morning. Labs on admission showed a WBC of 22,000 hemoglobin 14.5 hematocrit of 41.9. CBC today WBC of 19.1 hemoglobin 11.8 hematocrit of 35  Xarelto  placed on  hold. She had CT of the abdomen and pelvis done through the emergency room without any oral or IV contrast and this was negative for any acute findings. She was noted to have some diverticulosis and the aorto bi iliac  graft   Past Medical History  Diagnosis Date  . Status post AAA (abdominal aortic aneurysm) repair 01/02/2013  . PAD (peripheral artery disease) 01/02/2013  . Osteoporosis, unspecified 01/02/2013  . Hyperlipemia 01/02/2013  . Hx of atrial flutter, on Xarelto 01/02/2013  . Essential hypertension, benign 01/02/2013  . Chronic kidney disease 01/02/2013  . Breast cancer 01/02/2013  . COPD (chronic obstructive pulmonary disease) 01/02/2013  . Dementia, on Namenda briefly in the past per review of records 01/02/2013  . GERD (gastroesophageal reflux disease) 01/02/2013  . Iron deficiency anemia 01/02/2013  . Unspecified hypothyroidism 01/02/2013    Past Surgical History  Procedure Laterality Date  . Abdominal aortic aneurysm repair    . Breast surgery      right lumpectomy    Prior to Admission medications   Medication Sig Start Date End Date Taking? Authorizing Provider  amLODipine (NORVASC) 5 MG tablet Take 1 tablet (5 mg total) by mouth daily. 01/02/13  Yes Terressa Koyanagi, DO  aspirin 81 MG tablet Take 81 mg by mouth daily.   Yes Historical Provider, MD  cholecalciferol (VITAMIN D) 1000 UNITS tablet Take 1,000 Units by mouth once a week. On tuesday   Yes Historical Provider, MD  cloNIDine (CATAPRES) 0.1 MG tablet Take 0.05 mg by mouth every evening.   Yes Historical Provider, MD  Fluticasone-Salmeterol (ADVAIR) 100-50 MCG/DOSE AEPB Inhale 1 puff into the lungs every 12 (twelve) hours. 01/16/13  Yes Terressa Koyanagi, DO  furosemide (LASIX) 40 MG tablet Take 1 tablet (40 mg total) by mouth daily. 01/02/13  Yes Terressa Koyanagi, DO  iron polysaccharides (NIFEREX) 150 MG capsule Take 150 mg by mouth daily.   Yes Historical Provider, MD  lactulose (CHRONULAC) 10 GM/15ML solution 30 g. 30 ml every 4  hours as needed for constipation   Yes Historical Provider, MD  levothyroxine (SYNTHROID) 25 MCG tablet Take 1.5 tablets (37.5 mcg total) by mouth daily before breakfast. 01/02/13  Yes Terressa Koyanagi, DO  Melatonin 3 MG TABS Take 3 mg by mouth at bedtime.   Yes Historical Provider, MD  metoprolol (LOPRESSOR) 100 MG tablet Take 1 tablet (100 mg total) by mouth 2 (two) times daily. 01/02/13  Yes Terressa Koyanagi, DO  potassium chloride SA (K-DUR,KLOR-CON) 20 MEQ tablet Take 1 tablet (20 mEq total) by mouth daily. 01/24/13  Yes Terressa Koyanagi, DO  Rivaroxaban (XARELTO) 15 MG TABS tablet Take 15 mg by mouth daily.   Yes Historical Provider, MD  simvastatin (ZOCOR) 40 MG tablet Take 1 tablet (40 mg total) by mouth every evening. 01/02/13  Yes Terressa Koyanagi, DO  vitamin C (ASCORBIC ACID) 500 MG tablet Take 500 mg by mouth daily.   Yes Historical Provider, MD    Current Facility-Administered Medications  Medication Dose Route Frequency Provider Last Rate Last Dose  . acetaminophen (TYLENOL) tablet 650 mg  650 mg Oral Q6H PRN Lynden Oxford, MD       Or  . acetaminophen (TYLENOL) suppository 650 mg  650 mg Rectal Q6H PRN Lynden Oxford, MD      . amLODipine (NORVASC) tablet 5 mg  5 mg Oral Daily Lynden Oxford, MD   5 mg at 02/16/13 1043  . antiseptic oral rinse (BIOTENE) solution 15 mL  15 mL Mouth Rinse q12n4p Lynden Oxford, MD   15 mL at 02/16/13 1200  . atorvastatin (LIPITOR) tablet 20 mg  20 mg Oral q1800 Drake Leach [place], [facility]      . chlorhexidine (PERIDEX) 0.12 % solution 15 mL  15 mL Mouth Rinse BID Lynden Oxford, MD   15 mL at 02/16/13 1045  . cloNIDine (CATAPRES) tablet 0.05 mg  0.05 mg Oral QPM Lynden Oxford, MD      . furosemide (LASIX) tablet 40 mg  40 mg Oral Daily Lynden Oxford, MD   40 mg at 02/16/13 1043  . iron polysaccharides (NIFEREX) capsule 150 mg  150 mg Oral Daily Lynden Oxford, MD   150 mg at 02/16/13 1043  . levothyroxine (SYNTHROID, LEVOTHROID) tablet 37.5 mcg  37.5 mcg Oral QAC breakfast  Lynden Oxford, MD   37.5 mcg at 02/16/13 0747  . metoprolol (LOPRESSOR) tablet 100 mg  100 mg Oral BID Lynden Oxford, MD   100 mg at 02/16/13 1043  . mometasone-formoterol (DULERA) 100-5 MCG/ACT inhaler 2 puff  2 puff Inhalation BID Lynden Oxford, MD   2 puff at 02/16/13 0737  . pantoprazole (PROTONIX) injection 40 mg  40 mg Intravenous Q12H Lynden Oxford, MD   40 mg at 02/16/13 1047  . sodium chloride 0.9 % injection 3 mL  3 mL Intravenous Q12H Lynden Oxford, MD   3 mL at 02/16/13 1047    Allergies as of 02/15/2013  . (No Known Allergies)    Family History  Problem Relation Age of Onset  . Cancer Mother  lung  . Heart disease Father 29    History   Social History  . Marital Status: Widowed    Spouse Name: N/A    Number of Children: N/A  . Years of Education: N/A   Occupational History  . Not on file.   Social History Main Topics  . Smoking status: Former Smoker    Quit date: 02/16/2008  . Smokeless tobacco: Never Used  . Alcohol Use: No  . Drug Use: No  . Sexually Active: Not on file   Other Topics Concern  . Not on file   Social History Narrative   Home Situation: living with daughter [name])      Spiritual Beliefs: none      Lifestyle: get around well in the house - uses a walker sometimes, has not had a history of any falls, has some mild dementia. She needs help with bathing. She needs some help with dressing. She does not do her own cooking or cleaning. Does not drive. She did manage all of her finances until 10/2012.             Review of Systems: Pertinent positive and negative review of systems were noted in the above HPI section.  All other review of systems was otherwise negative.Marland Kitchen  Physical Exam: Vital signs in last 24 hours: Temp:  [97.6 F (36.4 C)-99.4 F (37.4 C)] 98.6 F (37 C) (08/02 0557) Pulse Rate:  [62-90] 67 (08/02 0557) Resp:  [14-27] 20 (08/02 0557) BP: (113-170)/(56-99) 130/68 mmHg (08/02 0911) SpO2:  [95 %-99 %] 96 % (08/02  0557) Weight:  [122 lb 9.2 oz (55.6 kg)] 122 lb 9.2 oz (55.6 kg) (08/01 2312) Last BM Date: 02/15/13 General:   Alert,  Well-developed, well-nourished,elderly WF  pleasant and cooperative in NAD,family at bedside Head:  Normocephalic and atraumatic. Eyes:  Sclera clear, no icterus.   Conjunctiva pink. Ears:  Normal auditory acuity. Nose:  No deformity, discharge,  or lesions. Mouth:  No deformity or lesions.   Neck:  Supple; no masses or thyromegaly. Lungs:  Clear throughout to auscultation.   No wheezes, crackles, or rhonchi. Heart:  Regular rate and rhythm; no murmurs, clicks, rubs,  or gallops. Abdomen:  Soft,mildly tender across lower abdomen,suprapubic area , BS active,nonpalp mass or hsm.   Rectal:  Small amt dark brown liquid stool- heme positive- not gross melena Msk:  Symmetrical without gross deformities. . Pulses:  Normal pulses noted. Extremities:  Without clubbing or edema. Neurologic:  Alert and  oriented x4;  grossly normal neurologically. Skin:  Intact without significant lesions or rashes.. Psych:  Alert and cooperative. Normal mood and affect.  Intake/Output from previous day: 08/01 0701 - 08/02 0700 In: -  Out: 50 [Stool:50] Intake/Output this shift: Total I/O In: -  Out: 250 [Urine:250]  Lab Results:  Recent Labs  02/15/13 1939 02/15/13 2338 02/16/13 0744  WBC 19.4* 22.0* 19.1*  HGB 14.5 13.1 11.8*  HCT 41.9 38.3 35.2*  PLT 200 157 179   BMET  Recent Labs  02/15/13 1939 02/16/13 0525  NA 138 137  K 4.5 4.5  CL 98 100  CO2 27 27  GLUCOSE 160* 153*  BUN 42* 44*  CREATININE 1.75* 1.67*  CALCIUM 10.0 9.2   LFT  Recent Labs  02/15/13 1939  PROT 8.3  ALBUMIN 4.2  AST 29  ALT 17  ALKPHOS 78  BILITOT 0.3   PT/INR  Recent Labs  02/15/13 1939 02/16/13 0525  LABPROT 13.1 25.2*  INR 1.01  2.38*   Hepatitis Panel No results found for this basename: HEPBSAG, HCVAB, HEPAIGM, HEPBIGM,  in the last 72 hours    Studies/Results: Ct  Abdomen Pelvis Wo Contrast  02/15/2013   *RADIOLOGY REPORT*  Clinical Data: 79 year old female with abdominal and pelvic pain, nausea, vomiting and diarrhea.  CT ABDOMEN AND PELVIS WITHOUT CONTRAST  Technique:  Multidetector CT imaging of the abdomen and pelvis was performed following the standard protocol without intravenous contrast.  Comparison: None  Findings: The lung bases are clear.  The liver, spleen, adrenal glands, pancreas and gallbladder are unremarkable. Right renal atrophy and left renal cyst are identified. Please note that parenchymal abnormalities may be missed as intravenous contrast was not administered.  There is no evidence of free fluid, enlarged lymph nodes or biliary dilatation. An abdominal aortic - biiliac stent graft is noted.  A large amount of rectal stool is noted. Descending and sigmoid colonic diverticulosis noted without evidence of diverticulitis. There is no evidence of bowel obstruction or pneumoperitoneum.  The bladder, uterus and adnexal regions are within normal limits.  No acute or suspicious bony abnormalities are noted.  IMPRESSION: No evidence of acute abnormality.  Large amount of rectal stool.   Original Report Authenticated By: Harmon Pier, M.D.   Dg Chest Port 1 View  02/16/2013   *RADIOLOGY REPORT*  Clinical Data: Leukocytosis.  PORTABLE CHEST - 1 VIEW  Comparison: No priors.  Findings: Lung volumes are normal.  No consolidative airspace disease.  No pleural effusions.  No pneumothorax.  No pulmonary nodule or mass noted.  Pulmonary vasculature and the cardiomediastinal silhouette are within normal limits. Atherosclerosis in the thoracic aorta.  IMPRESSION: 1. No radiographic evidence of acute cardiopulmonary disease. 2.  Atherosclerosis.   Original Report Authenticated By: Trudie Reed, M.D.    IMPRESSION:  #74  79 year old female with an acute episode of severe abdominal pain followed by nausea vomiting and tarry diarrhea-documented Hemoccult positive. I  don't think this episode represents an acute GI bleed, more likely an acute  segmental ischemic colitis or other acute infectious gastroenteritis She has a significant leukocytosis. She also has history of iron deficiency anemia, has been on oral iron at home and has had a hemoglobin in the 11-12 range over the past few months  #2 history of atrial fib flutter # 3 chronic anticoagulation with Xarelto #4 peripheral arterial disease #5 status post remote aorto bi-iliac stent #6 history breast cancer #7 COPD #8 GERD #9 Dementia- probable Alzheimer's     PLAN: #1 continue clear to full liquid diet #2 repeat pro time INR-question air or not sure why INR would be elevated at this time #3 PPI IV #4 serial hemoglobins and repeat CBC in a.m. #5 supportive management #6 would like to consider repeat CT scan with IV and oral contrast-will discuss #7 mentioned  the possibility of endoscopic evaluation due to patient and family, though if  can get further information on CT this may not be necessary   Amy Esterwood  02/16/2013, 12:43 PM  GI ATTENDING  History, labs, x-rays reviewed. Patient personally seen and examined. Family in room earlier, now friends. Complicated patient... Agree with H&P as outlined above.  IMPRESSION 1. Acute gastroenteritis with nausea, vomiting, diarrhea, and abdominal pain. Markedly improved today. 2. Dehydration secondary to #1. Improved. 3.Heme + stool WITHOUT GI BLEED. Stool is brown. Hg is at baseline. 4. MULTIPLE significant medical problems and advanced age.  RECOMMENDATIONS 1. Advance diet as tolerated 2.Continue PPI indefinitely for UGI mucosal protection  3.Primary service to decide if anticoagulation therapy important to this patient's management. No GI contraindication 4. NO plans for endoscopic evaluations in this patient at this time  We are available if needed. Will sign off. Thank you  Wilhemina Bonito. Eda Keys., M.D. [facility] Division of  Gastroenterology

## 2013-02-17 DIAGNOSIS — F0390 Unspecified dementia without behavioral disturbance: Secondary | ICD-10-CM

## 2013-02-17 LAB — CBC
HCT: 34.5 % — ABNORMAL LOW (ref 36.0–46.0)
MCH: 31.3 pg (ref 26.0–34.0)
MCHC: 33.9 g/dL (ref 30.0–36.0)
MCV: 92.2 fL (ref 78.0–100.0)
Platelets: 164 10*3/uL (ref 150–400)
RDW: 13.2 % (ref 11.5–15.5)

## 2013-02-17 LAB — BASIC METABOLIC PANEL
BUN: 36 mg/dL — ABNORMAL HIGH (ref 6–23)
CO2: 30 mEq/L (ref 19–32)
Calcium: 9.4 mg/dL (ref 8.4–10.5)
Creatinine, Ser: 1.7 mg/dL — ABNORMAL HIGH (ref 0.50–1.10)
Glucose, Bld: 90 mg/dL (ref 70–99)
Sodium: 138 mEq/L (ref 135–145)

## 2013-02-17 MED ORDER — PANTOPRAZOLE SODIUM 40 MG PO TBEC: 40.0000 mg | DELAYED_RELEASE_TABLET | Freq: Every day | ORAL | Status: DC

## 2013-02-17 MED ORDER — ATORVASTATIN CALCIUM 20 MG PO TABS: 20.0000 mg | ORAL_TABLET | Freq: Every day | ORAL | Status: DC

## 2013-02-17 MED ORDER — METRONIDAZOLE 500 MG PO TABS: 500.0000 mg | ORAL_TABLET | Freq: Three times a day (TID) | ORAL | Status: DC

## 2013-02-17 MED ORDER — CIPROFLOXACIN HCL 500 MG PO TABS: 250.0000 mg | ORAL_TABLET | Freq: Two times a day (BID) | ORAL | Status: DC

## 2013-02-17 NOTE — Progress Notes (Signed)
NURSING PROGRESS NOTE  Caroline Underwood [id number] Discharge Data: 02/17/2013 11:58 AM Attending Provider: Maretta Bees, MD PCP:KIM, Damita Lack., DO     Kloee Kaiser to be D/C'd Home per MD order.    All IV's discontinued with no bleeding noted.  All belongings returned to patient for patient to take home.   Reviewed discharge instructions with daughter. All questions answered.   Last Vital Signs:  Blood pressure 163/64, pulse 60, temperature 98.3 F (36.8 C), temperature source Oral, resp. rate 18, height 5\' 5"  (1.651 m), weight 55.6 kg (122 lb 9.2 oz), SpO2 95.00%.  Discharge Medication List   Medication List    STOP taking these medications       simvastatin 40 MG tablet  Commonly known as:  ZOCOR      TAKE these medications       amLODipine 5 MG tablet  Commonly known as:  NORVASC  Take 1 tablet (5 mg total) by mouth daily.     aspirin 81 MG tablet  Take 81 mg by mouth daily.     atorvastatin 20 MG tablet  Commonly known as:  LIPITOR  Take 1 tablet (20 mg total) by mouth daily at 6 PM.     cholecalciferol 1000 UNITS tablet  Commonly known as:  VITAMIN D  Take 1,000 Units by mouth once a week. On tuesday     ciprofloxacin 500 MG tablet  Commonly known as:  CIPRO  Take 0.5 tablets (250 mg total) by mouth 2 (two) times daily.     cloNIDine 0.1 MG tablet  Commonly known as:  CATAPRES  Take 0.05 mg by mouth every evening.     Fluticasone-Salmeterol 100-50 MCG/DOSE Aepb  Commonly known as:  ADVAIR  Inhale 1 puff into the lungs every 12 (twelve) hours.     furosemide 40 MG tablet  Commonly known as:  LASIX  Take 1 tablet (40 mg total) by mouth daily.     iron polysaccharides 150 MG capsule  Commonly known as:  NIFEREX  Take 150 mg by mouth daily.     lactulose 10 GM/15ML solution  Commonly known as:  CHRONULAC  30 g. 30 ml every 4 hours as needed for constipation     levothyroxine 25 MCG tablet  Commonly known as:  SYNTHROID  Take 1.5  tablets (37.5 mcg total) by mouth daily before breakfast.     Melatonin 3 MG Tabs  Take 3 mg by mouth at bedtime.     metoprolol 100 MG tablet  Commonly known as:  LOPRESSOR  Take 1 tablet (100 mg total) by mouth 2 (two) times daily.     metroNIDAZOLE 500 MG tablet  Commonly known as:  FLAGYL  Take 1 tablet (500 mg total) by mouth 3 (three) times daily.     pantoprazole 40 MG tablet  Commonly known as:  PROTONIX  Take 1 tablet (40 mg total) by mouth daily.     potassium chloride SA 20 MEQ tablet  Commonly known as:  K-DUR,KLOR-CON  Take 1 tablet (20 mEq total) by mouth daily.     Rivaroxaban 15 MG Tabs tablet  Commonly known as:  XARELTO  Take 15 mg by mouth daily.     vitamin C 500 MG tablet  Commonly known as:  ASCORBIC ACID  Take 500 mg by mouth daily.        Madelin Rear, MSN, RN, [employer]

## 2013-02-17 NOTE — Discharge Summary (Signed)
PATIENT DETAILS Name: Caroline Underwood Age: 79 y.o. Sex: female Date of Birth: May 26, 1922 MRN: [id number]. Admit Date: 02/15/2013 Admitting Physician: Lynden Oxford, MD PCP:KIM, Damita Lack., DO  Recommendations for Outpatient Follow-up:  1. Please check CBC and chemistries at next visit  PRIMARY DISCHARGE DIAGNOSIS:  Principal Problem:   Gastroenteritis Active Problems:   Essential hypertension, benign   Hx of atrial flutter, on Xarelto   Hyperlipemia   Chronic kidney disease   Dementia, on Namenda briefly in the past per review of records   Intractable nausea and vomiting   Abdominal pain, epigastric       PAST MEDICAL HISTORY: Past Medical History  Diagnosis Date  . Status post AAA (abdominal aortic aneurysm) repair 01/02/2013  . PAD (peripheral artery disease) 01/02/2013  . Osteoporosis, unspecified 01/02/2013  . Hyperlipemia 01/02/2013  . Hx of atrial flutter, on Xarelto 01/02/2013  . Essential hypertension, benign 01/02/2013  . Chronic kidney disease 01/02/2013  . Breast cancer 01/02/2013  . COPD (chronic obstructive pulmonary disease) 01/02/2013  . Dementia, on Namenda briefly in the past per review of records 01/02/2013  . GERD (gastroesophageal reflux disease) 01/02/2013  . Iron deficiency anemia 01/02/2013  . Unspecified hypothyroidism 01/02/2013    DISCHARGE MEDICATIONS:   Medication List    STOP taking these medications       simvastatin 40 MG tablet  Commonly known as:  ZOCOR      TAKE these medications       amLODipine 5 MG tablet  Commonly known as:  NORVASC  Take 1 tablet (5 mg total) by mouth daily.     aspirin 81 MG tablet  Take 81 mg by mouth daily.     atorvastatin 20 MG tablet  Commonly known as:  LIPITOR  Take 1 tablet (20 mg total) by mouth daily at 6 PM.     cholecalciferol 1000 UNITS tablet  Commonly known as:  VITAMIN D  Take 1,000 Units by mouth once a week. On tuesday     ciprofloxacin 500 MG tablet  Commonly known as:  CIPRO  Take 0.5  tablets (250 mg total) by mouth 2 (two) times daily.     cloNIDine 0.1 MG tablet  Commonly known as:  CATAPRES  Take 0.05 mg by mouth every evening.     Fluticasone-Salmeterol 100-50 MCG/DOSE Aepb  Commonly known as:  ADVAIR  Inhale 1 puff into the lungs every 12 (twelve) hours.     furosemide 40 MG tablet  Commonly known as:  LASIX  Take 1 tablet (40 mg total) by mouth daily.     iron polysaccharides 150 MG capsule  Commonly known as:  NIFEREX  Take 150 mg by mouth daily.     lactulose 10 GM/15ML solution  Commonly known as:  CHRONULAC  30 g. 30 ml every 4 hours as needed for constipation     levothyroxine 25 MCG tablet  Commonly known as:  SYNTHROID  Take 1.5 tablets (37.5 mcg total) by mouth daily before breakfast.     Melatonin 3 MG Tabs  Take 3 mg by mouth at bedtime.     metoprolol 100 MG tablet  Commonly known as:  LOPRESSOR  Take 1 tablet (100 mg total) by mouth 2 (two) times daily.     metroNIDAZOLE 500 MG tablet  Commonly known as:  FLAGYL  Take 1 tablet (500 mg total) by mouth 3 (three) times daily.     pantoprazole 40 MG tablet  Commonly known as:  PROTONIX  Take  1 tablet (40 mg total) by mouth daily.     potassium chloride SA 20 MEQ tablet  Commonly known as:  K-DUR,KLOR-CON  Take 1 tablet (20 mEq total) by mouth daily.     Rivaroxaban 15 MG Tabs tablet  Commonly known as:  XARELTO  Take 15 mg by mouth daily.     vitamin C 500 MG tablet  Commonly known as:  ASCORBIC ACID  Take 500 mg by mouth daily.        ALLERGIES:  No Known Allergies  BRIEF HPI:  See H&P, Labs, Consult and Test reports for all details in brief, patient is a 79 year old female with a past medical history of A. fib on Xarelto, breast cancer, hypertension, dementia who recently moved to [place] from [place], [place], presented to the hospital with a one-day history of sudden onset of abdominal pain associated with vomiting and diarrhea. Her family claimed that she  had some episodes of black stools. She was then admitted for further evaluation and treatment.  CONSULTATIONS:   GI  PERTINENT RADIOLOGIC STUDIES: Ct Abdomen Pelvis Wo Contrast  02/15/2013   *RADIOLOGY REPORT*  Clinical Data: 79 year old female with abdominal and pelvic pain, nausea, vomiting and diarrhea.  CT ABDOMEN AND PELVIS WITHOUT CONTRAST  Technique:  Multidetector CT imaging of the abdomen and pelvis was performed following the standard protocol without intravenous contrast.  Comparison: None  Findings: The lung bases are clear.  The liver, spleen, adrenal glands, pancreas and gallbladder are unremarkable. Right renal atrophy and left renal cyst are identified. Please note that parenchymal abnormalities may be missed as intravenous contrast was not administered.  There is no evidence of free fluid, enlarged lymph nodes or biliary dilatation. An abdominal aortic - biiliac stent graft is noted.  A large amount of rectal stool is noted. Descending and sigmoid colonic diverticulosis noted without evidence of diverticulitis. There is no evidence of bowel obstruction or pneumoperitoneum.  The bladder, uterus and adnexal regions are within normal limits.  No acute or suspicious bony abnormalities are noted.  IMPRESSION: No evidence of acute abnormality.  Large amount of rectal stool.   Original Report Authenticated By: Harmon Pier, M.D.   Dg Chest Port 1 View  02/16/2013   *RADIOLOGY REPORT*  Clinical Data: Leukocytosis.  PORTABLE CHEST - 1 VIEW  Comparison: No priors.  Findings: Lung volumes are normal.  No consolidative airspace disease.  No pleural effusions.  No pneumothorax.  No pulmonary nodule or mass noted.  Pulmonary vasculature and the cardiomediastinal silhouette are within normal limits. Atherosclerosis in the thoracic aorta.  IMPRESSION: 1. No radiographic evidence of acute cardiopulmonary disease. 2.  Atherosclerosis.   Original Report Authenticated By: Trudie Reed, M.D.     PERTINENT  LAB RESULTS: CBC:  Recent Labs  02/16/13 0744 02/17/13 0533  WBC 19.1* 12.9*  HGB 11.8* 11.7*  HCT 35.2* 34.5*  PLT 179 164   CMET CMP     Component Value Date/Time   NA 138 02/17/2013 0533   NA 141 02/05/2013 1551   K 3.9 02/17/2013 0533   K 4.3 02/05/2013 1551   CL 99 02/17/2013 0533   CO2 30 02/17/2013 0533   CO2 30* 02/05/2013 1551   GLUCOSE 90 02/17/2013 0533   GLUCOSE 100 02/05/2013 1551   BUN 36* 02/17/2013 0533   BUN 42.9* 02/05/2013 1551   CREATININE 1.70* 02/17/2013 0533   CREATININE 1.9* 02/05/2013 1551   CALCIUM 9.4 02/17/2013 0533   CALCIUM 9.6 02/05/2013 1551   PROT 8.3  02/15/2013 1939   PROT 7.5 02/05/2013 1551   ALBUMIN 4.2 02/15/2013 1939   ALBUMIN 3.7 02/05/2013 1551   AST 29 02/15/2013 1939   AST 26 02/05/2013 1551   ALT 17 02/15/2013 1939   ALT 19 02/05/2013 1551   ALKPHOS 78 02/15/2013 1939   ALKPHOS 76 02/05/2013 1551   BILITOT 0.3 02/15/2013 1939   BILITOT 0.21 02/05/2013 1551   GFRNONAA 26* 02/17/2013 0533   GFRAA 30* 02/17/2013 0533    GFR Estimated Creatinine Clearance: 20.1 ml/min (by C-G formula based on Cr of 1.7). No results found for this basename: LIPASE, AMYLASE,  in the last 72 hours No results found for this basename: CKTOTAL, CKMB, CKMBINDEX, TROPONINI,  in the last 72 hours No components found with this basename: POCBNP,  No results found for this basename: DDIMER,  in the last 72 hours No results found for this basename: HGBA1C,  in the last 72 hours No results found for this basename: CHOL, HDL, LDLCALC, TRIG, CHOLHDL, LDLDIRECT,  in the last 72 hours No results found for this basename: TSH, T4TOTAL, FREET3, T3FREE, THYROIDAB,  in the last 72 hours No results found for this basename: VITAMINB12, FOLATE, FERRITIN, TIBC, IRON, RETICCTPCT,  in the last 72 hours Coags:  Recent Labs  02/16/13 0525 02/17/13 0533  INR 2.38* 1.08   Microbiology: No results found for this or any previous visit (from the past 240 hour(s)).   BRIEF HOSPITAL COURSE:  Abdominal  Pain/Vomiting/Diarrhea with black stools.  -not sure what the exact etiology is-however given elevated WBC-suspect that she may have a either a viral syndrome with subsequent sloughing of mucosa and transient GI bleed, or an ischemic colitis  -unfortunately given dementia-unable to get more history  -she was on Xarelto, was held on admission - She was admitted, given supportive care, leukocytosis started downtrending. Her diet was slowly advanced, this morning she has tolerated a regular diet. Thankfully her abdominal pain has resolved, she has had no further vomiting or diarrhea during her inpatient stay here. Nursing staff has not noticed any further black stools during her stay here. - Because of the fact that she was on Xarelto, GI was consulted to see if the patient needed a endoscopic procedure prior to the initiation of Xarelto, consult was provided by Dr. Marina Goodell, who did not feel that patient required any sort of endoscopic procedure, and did not feel that there was any GI contraindication to Xarelto. Hemoglobin and hematocrit were monitored, although she did drop from a hemoglobin of 14.5 on admission (likely hemoconcentrated, and also was hydrated with IV fluids to start-hemoglobin has been stable compared to yesterday. This morning hemoglobin is 11.7, was 11.8 yesterday. Please also note that, no further melanotic stools have been noticed in the past 48 hours. - At this time, family is requesting discharge, I feel that the patient has met maximum benefit from a hospital stay-she no longer has abdominal pain, no longer has any vomiting or diarrhea and is tolerating a regular diet, I feel that she should be stable to be discharged home today. We will empirically discharge her on ciprofloxacin and Flagyl. - She lives with her son and daughter, will keep a close eye on her and seek immediate medical attention if her symptoms were to recur.  Essential hypertension, benign  -controlled  -c/w  Amlodipine, Metoprolol and clonidine   UTI - Urinalysis was consistent with UTI, she will be on ciprofloxacin on discharge.  Hx of atrial flutter  -Xarelto on hold  on admission, we'll resume on discharge -on Metoprolol  - She has a PVC scheduled appointment with Dr. Melburn Popper, on 8/11 which the family intends to keep.  Hyperlipemia  -c/w Lipitor   Chronic kidney disease Stage III  -at baseline   Dementia  -pleasantly confused-at baseline  TODAY-DAY OF DISCHARGE:  Subjective:   Ruthann Leeds today has no headache,no chest abdominal pain,no new weakness tingling or numbness, feels much better wants to go home today. She is pleasantly confused, but can answer most of my questions appropriately.  Objective:   Blood pressure 165/72, pulse 56, temperature 98.3 F (36.8 C), temperature source Oral, resp. rate 18, height 5\' 5"  (1.651 m), weight 55.6 kg (122 lb 9.2 oz), SpO2 95.00%.  Intake/Output Summary (Last 24 hours) at 02/17/13 0905 Last data filed at 02/17/13 0835  Gross per 24 hour  Intake      0 ml  Output   2250 ml  Net  -2250 ml   Filed Weights   02/15/13 2312  Weight: 55.6 kg (122 lb 9.2 oz)    Exam Awake Alert, No new F.N deficits, Normal affect. [place].AT,PERRAL Supple Neck,No JVD, No cervical lymphadenopathy appriciated.  Symmetrical Chest wall movement, Good air movement bilaterally, CTAB RRR,No Gallops,Rubs or new Murmurs, No Parasternal Heave +ve B.Sounds, Abd Soft, Non tender, No organomegaly appriciated, No rebound -guarding or rigidity. No Cyanosis, Clubbing or edema, No new Rash or bruise  DISCHARGE CONDITION: Stable  DISPOSITION: Home  DISCHARGE INSTRUCTIONS:    Activity:  As tolerated with Full fall precautions use walker/cane & assistance as needed  Diet recommendation: Heart Healthy diet      Discharge Orders   Future Appointments Provider Department Dept Phone   02/25/2013 3:45 PM Vesta Mixer, MD [facility] Main Office  [place]) [phone]   04/17/2013 8:00 AM Terressa Koyanagi, DO [facility] [phone]   07/26/2013 10:00 AM Adam Gus Rankin, DO [facility] [place] [phone]   Future Orders Complete By Expires     Call MD for:  persistant nausea and vomiting  As directed     Call MD for:  severe uncontrolled pain  As directed     Call MD for:  As directed     Scheduling Instructions:      Black tarry stools    Diet - low sodium heart healthy  As directed     Increase activity slowly  As directed        Follow-up Information   Follow up with Kriste Basque R., DO In 1 week.   Contact information:   [address] [place] [address] [phone]       Total Time spent on discharge equals 45 minutes.  SignedJeoffrey Massed 02/17/2013 9:05 AM

## 2013-02-19 ENCOUNTER — Telehealth: Payer: Self-pay | Admitting: Neurology

## 2013-02-19 NOTE — Telephone Encounter (Signed)
Returned a call from [place] re: [employer]. Asked that she return my call.

## 2013-02-20 ENCOUNTER — Other Ambulatory Visit: Payer: Self-pay

## 2013-02-20 ENCOUNTER — Other Ambulatory Visit: Payer: Self-pay | Admitting: *Deleted

## 2013-02-20 LAB — STOOL CULTURE

## 2013-02-21 NOTE — Telephone Encounter (Signed)
Left a message for [name] to return my call re: Namenda.

## 2013-02-22 NOTE — Telephone Encounter (Signed)
Caroline Underwood left me a vm message stating can't afford Namenda and that it doesn't come in a generic (I called and spoke with a pharmacist at their drugstore and he also confirmed that). She wants to know if Dr. Everlena Cooper has another recommendation. **Dr. Everlena Cooper, please advise.

## 2013-02-22 NOTE — Telephone Encounter (Signed)
We can try Aricept.  We can start 5mg  daily for four weeks.  If  tolerating the medication, then after four weeks, we will increase the dose to 10mg  daily.  Side effects include nausea, vomiting, diarrhea, vivid dreams, and muscle cramps.  Please call the clinic if she experiences any of these symptoms.

## 2013-02-25 ENCOUNTER — Ambulatory Visit (INDEPENDENT_AMBULATORY_CARE_PROVIDER_SITE_OTHER): Payer: Medicare Other | Admitting: Family Medicine

## 2013-02-25 ENCOUNTER — Encounter: Payer: Self-pay | Admitting: Family Medicine

## 2013-02-25 ENCOUNTER — Ambulatory Visit (INDEPENDENT_AMBULATORY_CARE_PROVIDER_SITE_OTHER): Payer: Medicare Other | Admitting: Cardiovascular Disease

## 2013-02-25 ENCOUNTER — Other Ambulatory Visit: Payer: Self-pay | Admitting: Neurology

## 2013-02-25 ENCOUNTER — Encounter: Payer: Self-pay | Admitting: Cardiovascular Disease

## 2013-02-25 VITALS — BP 120/80 | Temp 97.4°F | Wt 122.0 lb

## 2013-02-25 VITALS — BP 120/60 | HR 55 | Ht 65.0 in | Wt 121.1 lb

## 2013-02-25 DIAGNOSIS — L8991 Pressure ulcer of unspecified site, stage 1: Secondary | ICD-10-CM

## 2013-02-25 DIAGNOSIS — K5289 Other specified noninfective gastroenteritis and colitis: Secondary | ICD-10-CM

## 2013-02-25 DIAGNOSIS — E785 Hyperlipidemia, unspecified: Secondary | ICD-10-CM

## 2013-02-25 DIAGNOSIS — Z8679 Personal history of other diseases of the circulatory system: Secondary | ICD-10-CM

## 2013-02-25 DIAGNOSIS — I129 Hypertensive chronic kidney disease with stage 1 through stage 4 chronic kidney disease, or unspecified chronic kidney disease: Secondary | ICD-10-CM

## 2013-02-25 DIAGNOSIS — N189 Chronic kidney disease, unspecified: Secondary | ICD-10-CM

## 2013-02-25 DIAGNOSIS — I10 Essential (primary) hypertension: Secondary | ICD-10-CM

## 2013-02-25 DIAGNOSIS — K529 Noninfective gastroenteritis and colitis, unspecified: Secondary | ICD-10-CM

## 2013-02-25 DIAGNOSIS — K219 Gastro-esophageal reflux disease without esophagitis: Secondary | ICD-10-CM

## 2013-02-25 DIAGNOSIS — L8990 Pressure ulcer of unspecified site, unspecified stage: Secondary | ICD-10-CM

## 2013-02-25 MED ORDER — RIVAROXABAN 15 MG PO TABS: 15.0000 mg | ORAL_TABLET | Freq: Every day | ORAL | Status: DC

## 2013-02-25 MED ORDER — DONEPEZIL HCL 5 MG PO TABS: 5.0000 mg | ORAL_TABLET | Freq: Every day | ORAL | Status: DC

## 2013-02-25 NOTE — Patient Instructions (Addendum)
Your physician has recommended you make the following change in your medication:   STOP CLONIDINE STOP ASPIRIN   Your physician wants you to follow-up in: 1 YEAR  You will receive a reminder letter in the mail two months in advance. If you don't receive a letter, please call our office to schedule the follow-up appointment.

## 2013-02-25 NOTE — Telephone Encounter (Signed)
Called and spoke with Robin. Willing to try the Aricept 5 mg daily x 4 weeks. Side effects discussed. Will call the office if unable to tolerate. Med will be e-scribed to the [facility] on [address]. No other issues at this time.

## 2013-02-25 NOTE — Progress Notes (Signed)
Caroline Underwood Date of Birth  April 12, 1922       [facility] Office [address]  [address], [address] [place], [place]  [address]   [place], [place]  [address] [phone]     [phone]   Fax  [phone]    Fax [phone]  Problem List: History of atrial flutter 2. Status post abdominal aortic aneurysm repair 3. Hyperlipidemia 4. COPD 5. Breast cancer - Dx May, 2014 6. Chronic kidney disease 7. Dementia 8. Hypertension  History of Present Illness:   Caroline Underwood is an 79 yo with the above noted medical history.   She moved from [place], [place] to [place] 8 weeks ago.    She has been on Xarelto for an unknown period of time.  Apparently the records from [place] commented that she had atrial flutter.    She had breast cancer surgery in May 2014 .  She did not receive any chemo or XRT.  ( triple negative breast CA).  She walks with the assistance of a walker - does not fall.  She was on multiple BP medications and was orthostasis in JUne /  July 2014 but her daughter stopped most of her BP meds.  Clonidine was cut   a significant amount.  Her orthostasis has improved.     Current Outpatient Prescriptions on File Prior to Visit  Medication Sig Dispense Refill  . amLODipine (NORVASC) 5 MG tablet Take 1 tablet (5 mg total) by mouth daily.  90 tablet  1  . aspirin 81 MG tablet Take 81 mg by mouth daily.      Marland Kitchen atorvastatin (LIPITOR) 20 MG tablet Take 1 tablet (20 mg total) by mouth daily at 6 PM.  30 tablet  0  . cholecalciferol (VITAMIN D) 1000 UNITS tablet Take 1,000 Units by mouth once a week. On tuesday      . cloNIDine (CATAPRES) 0.1 MG tablet Take 0.05 mg by mouth every evening.      . Fluticasone-Salmeterol (ADVAIR) 100-50 MCG/DOSE AEPB Inhale 1 puff into the lungs every 12 (twelve) hours.  60 each  3  . furosemide (LASIX) 40 MG tablet Take 1 tablet (40 mg total) by mouth daily.  90 tablet  1  . iron polysaccharides (NIFEREX)  150 MG capsule Take 150 mg by mouth daily.      Marland Kitchen lactulose (CHRONULAC) 10 GM/15ML solution 30 g. 30 ml every 4 hours as needed for constipation      . levothyroxine (SYNTHROID) 25 MCG tablet Take 1.5 tablets (37.5 mcg total) by mouth daily before breakfast.  90 tablet  1  . Melatonin 3 MG TABS Take 3 mg by mouth at bedtime.      . metoprolol (LOPRESSOR) 100 MG tablet Take 1 tablet (100 mg total) by mouth 2 (two) times daily.  180 tablet  1  . potassium chloride SA (K-DUR,KLOR-CON) 20 MEQ tablet Take 1 tablet (20 mEq total) by mouth daily.  30 tablet  3  . Rivaroxaban (XARELTO) 15 MG TABS tablet Take 15 mg by mouth daily.      . vitamin C (ASCORBIC ACID) 500 MG tablet Take 500 mg by mouth daily.       No current facility-administered medications on file prior to visit.    No Known Allergies  Past Medical History  Diagnosis Date  . Status post AAA (abdominal aortic aneurysm) repair 01/02/2013  . PAD (peripheral artery disease) 01/02/2013  .  Osteoporosis, unspecified 01/02/2013  . Hyperlipemia 01/02/2013  . Hx of atrial flutter, on Xarelto 01/02/2013  . Essential hypertension, benign 01/02/2013  . Chronic kidney disease 01/02/2013  . Breast cancer 01/02/2013  . COPD (chronic obstructive pulmonary disease) 01/02/2013  . Dementia, on Namenda briefly in the past per review of records 01/02/2013  . GERD (gastroesophageal reflux disease) 01/02/2013  . Iron deficiency anemia 01/02/2013  . Unspecified hypothyroidism 01/02/2013    Past Surgical History  Procedure Laterality Date  . Abdominal aortic aneurysm repair    . Breast surgery      right lumpectomy    History  Smoking status  . Former Smoker  . Quit date: 02/16/2008  Smokeless tobacco  . Never Used    History  Alcohol Use No    Family History  Problem Relation Age of Onset  . Cancer Mother     lung  . Heart disease Father 98    Reviw of Systems:  Reviewed in the HPI.  All other systems are negative.  Physical Exam: Blood  pressure 120/60, pulse 55, height 5\' 5"  (1.651 m), weight 121 lb 1.9 oz (54.94 kg), SpO2 95.00%. General: Well developed, well nourished, in no acute distress.  Head: Normocephalic, atraumatic, sclera non-icteric, mucus membranes are moist,   Neck: Supple. Carotids are 2 + without bruits. No JVD   Lungs: Clear   Heart: RR, normal S1, S2.  Abdomen: Soft, non-tender, non-distended with normal bowel sounds.  Msk:  Strength and tone are normal   Extremities: No clubbing or cyanosis. No edema.  Distal pedal pulses are 2+ and equal    Neuro: CN II - XII intact.  Alert and oriented X 3.   Psych:  Normal   ECG: February 15, 2013.  NSR at 60.    Assessment / Plan:

## 2013-02-25 NOTE — Progress Notes (Signed)
Chief Complaint  Patient presents with  . Hospitalization Follow-up    HPI:  Caroline Underwood 79 yo F with complicated PMH here for hospital follow up:  Per discharge document, admitted to hospital 8/1-8/3 for a gastroenteritis versus ischemic colitis, UTI -CT and CXR ok -did have elvated WBC, improved on discharge - tx with cipro and flagyl empirically -GI consulted and were ok with resuming xaralto -UTI tx with cipro -has appt to see Dr. Melburn Popper today -dementia and kidney disease at baseline -she has recently switched from namenda to aricept due to cost with her neurologist per phone notes -she is followed by oncology for her hx of breast cancer -reviewed labs from 1 week ago - Cr, Hgb/Hct at baseline -patient reports she is doing well - reports diarrhea, nausea, melena resolved, BRBPR no foul smelling stool, denies CP, SOB, DOE, palpitations -daughter reports zocor stopped in hospital and atorvastatin started - - they will discuss with cardiologist  ROS: See pertinent positives and negatives per HPI.  Past Medical History  Diagnosis Date  . Status post AAA (abdominal aortic aneurysm) repair 01/02/2013  . PAD (peripheral artery disease) 01/02/2013  . Osteoporosis, unspecified 01/02/2013  . Hyperlipemia 01/02/2013  . Hx of atrial flutter, on Xarelto 01/02/2013  . Essential hypertension, benign 01/02/2013  . Chronic kidney disease 01/02/2013  . Breast cancer 01/02/2013  . COPD (chronic obstructive pulmonary disease) 01/02/2013  . Dementia, on Namenda briefly in the past per review of records 01/02/2013  . GERD (gastroesophageal reflux disease) 01/02/2013  . Iron deficiency anemia 01/02/2013  . Unspecified hypothyroidism 01/02/2013    Family History  Problem Relation Age of Onset  . Cancer Mother     lung  . Heart disease Father 65    History   Social History  . Marital Status: Widowed    Spouse Name: N/A    Number of Children: N/A  . Years of Education: N/A   Social History  Main Topics  . Smoking status: Former Smoker    Quit date: 02/16/2008  . Smokeless tobacco: Never Used  . Alcohol Use: No  . Drug Use: No  . Sexually Active: None   Other Topics Concern  . None   Social History Narrative   Home Situation: living with daughter [name])      Spiritual Beliefs: none      Lifestyle: get around well in the house - uses a walker sometimes, has not had a history of any falls, has some mild dementia. She needs help with bathing. She needs some help with dressing. She does not do her own cooking or cleaning. Does not drive. She did manage all of her finances until 10/2012.             Current outpatient prescriptions:amLODipine (NORVASC) 5 MG tablet, Take 1 tablet (5 mg total) by mouth daily., Disp: 90 tablet, Rfl: 1;  aspirin 81 MG tablet, Take 81 mg by mouth daily., Disp: , Rfl: ;  atorvastatin (LIPITOR) 20 MG tablet, Take 1 tablet (20 mg total) by mouth daily at 6 PM., Disp: 30 tablet, Rfl: 0;  cholecalciferol (VITAMIN D) 1000 UNITS tablet, Take 1,000 Units by mouth once a week. On tuesday, Disp: , Rfl:  ciprofloxacin (CIPRO) 500 MG tablet, Take 0.5 tablets (250 mg total) by mouth 2 (two) times daily., Disp: 10 tablet, Rfl: 0;  cloNIDine (CATAPRES) 0.1 MG tablet, Take 0.05 mg by mouth every evening., Disp: , Rfl: ;  Fluticasone-Salmeterol (ADVAIR) 100-50 MCG/DOSE AEPB, Inhale 1 puff into  the lungs every 12 (twelve) hours., Disp: 60 each, Rfl: 3;  furosemide (LASIX) 40 MG tablet, Take 1 tablet (40 mg total) by mouth daily., Disp: 90 tablet, Rfl: 1 iron polysaccharides (NIFEREX) 150 MG capsule, Take 150 mg by mouth daily., Disp: , Rfl: ;  lactulose (CHRONULAC) 10 GM/15ML solution, 30 g. 30 ml every 4 hours as needed for constipation, Disp: , Rfl: ;  levothyroxine (SYNTHROID) 25 MCG tablet, Take 1.5 tablets (37.5 mcg total) by mouth daily before breakfast., Disp: 90 tablet, Rfl: 1;  Melatonin 3 MG TABS, Take 3 mg by mouth at bedtime., Disp: , Rfl:  metoprolol  (LOPRESSOR) 100 MG tablet, Take 1 tablet (100 mg total) by mouth 2 (two) times daily., Disp: 180 tablet, Rfl: 1;  metroNIDAZOLE (FLAGYL) 500 MG tablet, Take 1 tablet (500 mg total) by mouth 3 (three) times daily., Disp: 15 tablet, Rfl: 0;  pantoprazole (PROTONIX) 40 MG tablet, Take 1 tablet (40 mg total) by mouth daily., Disp: 30 tablet, Rfl: 0 potassium chloride SA (K-DUR,KLOR-CON) 20 MEQ tablet, Take 1 tablet (20 mEq total) by mouth daily., Disp: 30 tablet, Rfl: 3;  Rivaroxaban (XARELTO) 15 MG TABS tablet, Take 15 mg by mouth daily., Disp: , Rfl: ;  vitamin C (ASCORBIC ACID) 500 MG tablet, Take 500 mg by mouth daily., Disp: , Rfl:   EXAM:  Filed Vitals:   02/25/13 0931  BP: 120/80  Temp: 97.4 F (36.3 C)    Body mass index is 20.3 kg/(m^2).  GENERAL: vitals reviewed and listed above, alert, oriented, appears well hydrated and in no acute distress  HEENT: atraumatic, conjunttiva clear, no obvious abnormalities on inspection of external nose and ears  NECK: no obvious masses on inspection  LUNGS: clear to auscultation bilaterally, no wheezes, rales or rhonchi, good air movement  CV: HRRR, no peripheral edema  ABD: BS+, soft, NTTP  SKIN: stage 1 pressure ulcer sacrum, no ulcer  MS: moves all extremities without noticeable abnormality  PSYCH: pleasant and cooperative, no obvious depression or anxiety  ASSESSMENT AND PLAN:  Discussed the following assessment and plan:  Gastroenteritis  Essential hypertension, benign  Hx of atrial flutter, on Xarelto  Hyperlipemia  Chronic kidney disease, unspecified stage  GERD (gastroesophageal reflux disease)  Pressure ulcer, stage I  -hospital course, treatments, recent labs and medications reviewed with patient - she is doing remarkably well today -she has follow up with Dr. Melburn Popper today regarding her CV issues -she has follow up with Neurology for her dementia, discussed options to consider such as adult daycare and  sitter -advised sitting in such a wayto avoid shearing forces on scrum and changing position frequently, keep clean and dry, desitin bid -follow up with me in 3 months -Patient advised to return or notify a doctor immediately if symptoms worsen or persist or new concerns arise.  There are no Patient Instructions on file for this visit.   Kriste Basque R.

## 2013-02-25 NOTE — Assessment & Plan Note (Signed)
Ms Saliba presents with a history of atrial flutter. I do not have any evidence of atrial flutter or atrial fibrillation with our EKGs.  She's tolerated the Xarelto.  She's not having problems with falling-especially after her clonidine dose was reduced. She walks with the assistance of walker.  In my opinion I think it is safer to continue with the anticoagulation. I've had a long discussion with the daughter and she will discontinue the Xarelto if there is any evidence of GI bleeding.  We will discontinue her aspirin.  I'll see her again in one year for followup office visit

## 2013-02-28 ENCOUNTER — Encounter: Payer: Self-pay | Admitting: Family Medicine

## 2013-02-28 MED ORDER — ATORVASTATIN CALCIUM 20 MG PO TABS: 20.0000 mg | ORAL_TABLET | Freq: Every day | ORAL | Status: DC

## 2013-02-28 NOTE — Telephone Encounter (Signed)
Ok to change medication and refill?

## 2013-02-28 NOTE — Telephone Encounter (Signed)
Mychart message sent.

## 2013-02-28 NOTE — Telephone Encounter (Signed)
Ok to continue if wishes. Sent rx to pharmacy.

## 2013-03-01 ENCOUNTER — Encounter: Payer: Self-pay | Admitting: Family Medicine

## 2013-03-09 ENCOUNTER — Encounter: Payer: Self-pay | Admitting: Cardiovascular Disease

## 2013-03-14 ENCOUNTER — Telehealth: Payer: Self-pay | Admitting: Neurology

## 2013-03-14 MED ORDER — DONEPEZIL HCL 10 MG PO TABS: 10.0000 mg | ORAL_TABLET | Freq: Every day | ORAL | Status: DC

## 2013-03-14 NOTE — Telephone Encounter (Signed)
Spoke with the patient's daughter, [name]. I let her know that I had received a refill request for her mom's Aricept. She reports that she is tolerating the medication ok. I told her we would increase it to the 10 mg dose as recommended by Dr. Everlena Cooper. Asked that the script [facility] to the [facility] [address]. Advised her to call if he had problems with the increased dose. She states she will.

## 2013-03-24 ENCOUNTER — Other Ambulatory Visit: Payer: Self-pay | Admitting: Family Medicine

## 2013-04-16 ENCOUNTER — Other Ambulatory Visit: Payer: Self-pay | Admitting: Family Medicine

## 2013-04-16 NOTE — Telephone Encounter (Signed)
There was a previous note from 02/26/2013 that pt's insurance only would pay for 30 with 1 refill.  Advised pts mother that i would call back to make sure pt got 90 days in all.  Called the pharmacy and spoke with an associate and was told pt's mother picked up rx on 02/25/13, 03/20/13 and 04/15/13.  No more refills needed.

## 2013-04-16 NOTE — Telephone Encounter (Signed)
Please call pharmacy and let then know her cardiologist fills this medicaiton - per phone notes from them they sent 11 refills [facility] [address]. Please ensure she gets the refills from cards.

## 2013-04-17 ENCOUNTER — Ambulatory Visit (INDEPENDENT_AMBULATORY_CARE_PROVIDER_SITE_OTHER): Payer: Medicare Other | Admitting: Family Medicine

## 2013-04-17 ENCOUNTER — Ambulatory Visit: Payer: Medicare Other | Admitting: Family Medicine

## 2013-04-17 ENCOUNTER — Encounter: Payer: Self-pay | Admitting: Family Medicine

## 2013-04-17 VITALS — BP 110/80 | Temp 97.5°F | Wt 119.0 lb

## 2013-04-17 DIAGNOSIS — E785 Hyperlipidemia, unspecified: Secondary | ICD-10-CM

## 2013-04-17 DIAGNOSIS — J449 Chronic obstructive pulmonary disease, unspecified: Secondary | ICD-10-CM

## 2013-04-17 DIAGNOSIS — D509 Iron deficiency anemia, unspecified: Secondary | ICD-10-CM

## 2013-04-17 DIAGNOSIS — K219 Gastro-esophageal reflux disease without esophagitis: Secondary | ICD-10-CM

## 2013-04-17 DIAGNOSIS — I10 Essential (primary) hypertension: Secondary | ICD-10-CM

## 2013-04-17 DIAGNOSIS — E039 Hypothyroidism, unspecified: Secondary | ICD-10-CM

## 2013-04-17 DIAGNOSIS — Z8679 Personal history of other diseases of the circulatory system: Secondary | ICD-10-CM

## 2013-04-17 DIAGNOSIS — I739 Peripheral vascular disease, unspecified: Secondary | ICD-10-CM

## 2013-04-17 DIAGNOSIS — J4489 Other specified chronic obstructive pulmonary disease: Secondary | ICD-10-CM

## 2013-04-17 DIAGNOSIS — F0280 Dementia in other diseases classified elsewhere without behavioral disturbance: Secondary | ICD-10-CM

## 2013-04-17 DIAGNOSIS — N189 Chronic kidney disease, unspecified: Secondary | ICD-10-CM

## 2013-04-17 DIAGNOSIS — M810 Age-related osteoporosis without current pathological fracture: Secondary | ICD-10-CM

## 2013-04-17 LAB — LIPID PANEL
Cholesterol: 184 mg/dL (ref 0–200)
HDL: 54.6 mg/dL (ref >39.00)
LDL Cholesterol: 97 mg/dL (ref 0–99)
VLDL: 32 mg/dL (ref 0.0–40.0)

## 2013-04-17 LAB — BASIC METABOLIC PANEL
BUN: 22 mg/dL (ref 6–23)
Chloride: 101 mEq/L (ref 96–112)
Glucose, Bld: 83 mg/dL (ref 70–99)
Potassium: 3.9 mEq/L (ref 3.5–5.1)

## 2013-04-17 LAB — CBC WITH DIFFERENTIAL/PLATELET
Basophils Relative: 0.5 % (ref 0.0–3.0)
Eosinophils Relative: 3.4 % (ref 0.0–5.0)
HCT: 40.4 % (ref 36.0–46.0)
Hemoglobin: 13.6 g/dL (ref 12.0–15.0)
Lymphs Abs: 2.3 10*3/uL (ref 0.7–4.0)
MCV: 92.7 fl (ref 78.0–100.0)
Monocytes Absolute: 0.7 10*3/uL (ref 0.1–1.0)
Neutro Abs: 7 10*3/uL (ref 1.4–7.7)
Platelets: 209 10*3/uL (ref 150.0–400.0)
RBC: 4.36 Mil/uL (ref 3.87–5.11)
WBC: 10.4 10*3/uL (ref 4.5–10.5)

## 2013-04-17 NOTE — Progress Notes (Signed)
No chief complaint on file.   HPI:  Follow up:  HTN/HLD/PAD/HX A. Flutter: -followed by cards -reviewed recent cards notes -continues xarelto, norvasc, lasic, metorpolol, pot-klor, lipitor -denies: CP, SOb, DOE, palpitations, edema -daughter reports issues with getting refills fo xarelto and lipitor - not out of these meds but soon to run out  Hypothyoroid: -on synthroid  Dementia: -on aricept, followed by neurology -stable - thinks on 5 mg stable  CKI: -stable with baseline cr around 1.7  COPD: -used advair daily  Breast cancer: -followed by onc, triple neg s/p surgical intervention -has follow up with onc in November  Hx iron def anemia and hypokal, but that had resolved on labs in the past - daughter would like to stop the iro na d klor-con if possible    ROS: See pertinent positives and negatives per HPI.  Past Medical History  Diagnosis Date  . Status post AAA (abdominal aortic aneurysm) repair 01/02/2013  . PAD (peripheral artery disease) 01/02/2013  . Osteoporosis, unspecified 01/02/2013  . Hyperlipemia 01/02/2013  . Hx of atrial flutter, on Xarelto 01/02/2013  . Essential hypertension, benign 01/02/2013  . Chronic kidney disease 01/02/2013  . Breast cancer 01/02/2013  . COPD (chronic obstructive pulmonary disease) 01/02/2013  . Dementia, on Namenda briefly in the past per review of records 01/02/2013  . GERD (gastroesophageal reflux disease) 01/02/2013  . Iron deficiency anemia 01/02/2013  . Unspecified hypothyroidism 01/02/2013    Past Surgical History  Procedure Laterality Date  . Abdominal aortic aneurysm repair    . Breast surgery      right lumpectomy    Family History  Problem Relation Age of Onset  . Cancer Mother     lung  . Heart disease Father 63    History   Social History  . Marital Status: Widowed    Spouse Name: N/A    Number of Children: N/A  . Years of Education: N/A   Social History Main Topics  . Smoking status: Former Smoker     Quit date: 02/16/2008  . Smokeless tobacco: Never Used  . Alcohol Use: No  . Drug Use: No  . Sexual Activity: None   Other Topics Concern  . None   Social History Narrative   Home Situation: living with daughter Caroline Underwood)      Spiritual Beliefs: none      Lifestyle: get around well in the house - uses a walker sometimes, has not had a history of any falls, has some mild dementia. She needs help with bathing. She needs some help with dressing. She does not do her own cooking or cleaning. Does not drive. She did manage all of her finances until 10/2012.             Current outpatient prescriptions:amLODipine (NORVASC) 5 MG tablet, Take 1 tablet (5 mg total) by mouth daily., Disp: 90 tablet, Rfl: 1;  atorvastatin (LIPITOR) 20 MG tablet, Take 1 tablet (20 mg total) by mouth daily at 6 PM., Disp: 90 tablet, Rfl: 3;  cholecalciferol (VITAMIN D) 1000 UNITS tablet, Take 1,000 Units by mouth once a week. On tuesday, Disp: , Rfl:  donepezil (ARICEPT) 10 MG tablet, Take 1 tablet (10 mg total) by mouth at bedtime. Take one tablet (10 mg total) by mouth daily at bedtime., Disp: 30 tablet, Rfl: 3;  Fluticasone-Salmeterol (ADVAIR) 100-50 MCG/DOSE AEPB, Inhale 1 puff into the lungs every 12 (twelve) hours., Disp: 60 each, Rfl: 3;  furosemide (LASIX) 40 MG tablet, Take 1 tablet (40  mg total) by mouth daily., Disp: 90 tablet, Rfl: 1 iron polysaccharides (NIFEREX) 150 MG capsule, Take 150 mg by mouth daily., Disp: , Rfl: ;  lactulose (CHRONULAC) 10 GM/15ML solution, 30 g. 30 ml every 4 hours as needed for constipation, Disp: , Rfl: ;  levothyroxine (SYNTHROID, LEVOTHROID) 25 MCG tablet, TAKE 1.5 TABLETS BY MOUTH DAILY BEFORE BREAKFAST, Disp: 90 tablet, Rfl: 1;  Melatonin 3 MG TABS, Take 3 mg by mouth at bedtime., Disp: , Rfl:  metoprolol (LOPRESSOR) 100 MG tablet, Take 1 tablet (100 mg total) by mouth 2 (two) times daily., Disp: 180 tablet, Rfl: 1;  potassium chloride SA (K-DUR,KLOR-CON) 20 MEQ tablet, Take 1  tablet (20 mEq total) by mouth daily., Disp: 30 tablet, Rfl: 3;  Rivaroxaban (XARELTO) 15 MG TABS tablet, Take 1 tablet (15 mg total) by mouth daily., Disp: 30 tablet, Rfl: 11;  vitamin C (ASCORBIC ACID) 500 MG tablet, Take 500 mg by mouth daily., Disp: , Rfl:   EXAM:  Filed Vitals:   04/17/13 0815  BP: 110/80  Temp: 97.5 F (36.4 C)    Body mass index is 19.8 kg/(m^2).  GENERAL: vitals reviewed and listed above, alert, oriented, appears well hydrated and in no acute distress  HEENT: atraumatic, conjunttiva clear, no obvious abnormalities on inspection of external nose and ears  NECK: no obvious masses on inspection  LUNGS: clear to auscultation bilaterally, no wheezes, rales or rhonchi, good air movement  CV: HRRR, no peripheral edema  MS: moves all extremities without noticeable abnormality  PSYCH: pleasant and cooperative, no obvious depression or anxiety  ASSESSMENT AND PLAN:  Discussed the following assessment and plan:  Essential hypertension, benign - Plan: Basic metabolic panel  Hx of atrial flutter, on Xarelto  Hyperlipemia - Plan: Lipid Panel  Osteoporosis, unspecified, on fosamax >5 years in the past per PCP notes  PAD (peripheral artery disease)  Chronic kidney disease  Unspecified hypothyroidism - Plan: TSH  GERD (gastroesophageal reflux disease)  COPD (chronic obstructive pulmonary disease)  Iron deficiency anemia - Plan: CBC with Differential  Alzheimer's disease  -doing remarkably well -labs today: TSH, BMP, Lipid, CBC -will stop klor con and iron if labs ok then recheck labs in 1-2 months -called pharmacy to ensure all order for her xarelto from the cardiologist and her statin are in at the pharmacy and confirmed this -follow up 3-4 months -Patient advised to return or notify a doctor immediately if symptoms worsen or persist or new concerns arise.  There are no Patient Instructions on file for this visit.   Caroline Basque R.

## 2013-04-23 ENCOUNTER — Encounter: Payer: Self-pay | Admitting: Family Medicine

## 2013-05-18 ENCOUNTER — Other Ambulatory Visit: Payer: Self-pay | Admitting: Family Medicine

## 2013-05-21 ENCOUNTER — Other Ambulatory Visit: Payer: Self-pay | Admitting: Family Medicine

## 2013-05-23 ENCOUNTER — Other Ambulatory Visit: Payer: Self-pay

## 2013-07-13 ENCOUNTER — Other Ambulatory Visit: Payer: Self-pay | Admitting: Family Medicine

## 2013-07-15 ENCOUNTER — Other Ambulatory Visit: Payer: Self-pay | Admitting: Neurology

## 2013-07-15 MED ORDER — DONEPEZIL HCL 10 MG PO TABS: ORAL_TABLET | ORAL | Status: DC

## 2013-07-26 ENCOUNTER — Encounter: Payer: Self-pay | Admitting: Neurology

## 2013-07-26 ENCOUNTER — Ambulatory Visit (INDEPENDENT_AMBULATORY_CARE_PROVIDER_SITE_OTHER): Payer: Medicare Other | Admitting: Neurology

## 2013-07-26 VITALS — BP 130/60 | HR 60 | Temp 97.5°F | Ht 65.0 in | Wt 122.0 lb

## 2013-07-26 DIAGNOSIS — G309 Alzheimer's disease, unspecified: Principal | ICD-10-CM

## 2013-07-26 DIAGNOSIS — F0280 Dementia in other diseases classified elsewhere without behavioral disturbance: Secondary | ICD-10-CM

## 2013-07-26 NOTE — Patient Instructions (Signed)
Continue Aricept 10mg  daily Try to stay active around the house Follow up in 9 months or as needed.

## 2013-07-26 NOTE — Progress Notes (Signed)
NEUROLOGY FOLLOW UP OFFICE NOTE  Caroline Underwood [id number]  HISTORY OF PRESENT ILLNESS: Caroline Underwood is a 80 y.o. female with history of right breast cancer, hypertension, peripheral artery disease, chronic kidney disease, iron deficiency anemia, COPD, and a flutter follows up for Alzheimer's disease. She is accompanied by her daughter.  History obtained from the chart and her daughter.  She recently moved from [facility] to live with her daughter.  She began experiencing symptoms many years ago. She was having problems with memory and often repeating questions, as well as forgetting tasks and misplacing objects. At some point, several years ago, she was started on Namenda by her primary care physician, but discontinued it at the request of her son. There has been a progressive decline in her condition. Her decline has significantly worsened this past year, especially since a recent hospitalization earlier this year. Earlier this year, she was found to have an elevated creatinine level. Her doctor therefore stop some of her blood pressure medications. She then developed an episode of altered mental status, where she was walking around naked and then was laying on the couch for 2 days. When she was admitted to the hospital, she was found to have hypertensive urgency.  She was found to have hypertensive urgency. MRI of the brain did not show any acute changes. She was also found to have a flutter and was started on Xarelto.  Recent labs showed a TSH of 5.6 and she was subsequently started on Synthroid.  Cr was mildly elevated at 1.7.  Ammonia level was 23.  B12 was ordered and reportedly normal.  Her confusion has improved and she is functioning better, but definitely worse than before. She has good days and bad days. She has always been able to pay her bills, up until this past year. She constantly chews her lip. She sleeps okay and takes melatonin. She denies feelings of depression.  She needs assistance with activities of daily living, including taking medications, bathing, and dressing. She emulate with a walker. She usually spends the entire day sitting and watching TV. She doesn't read much except for Mr. Bea Laura. Anson Fret. She enjoys trying to solve the puzzle before the end of the story. Most other books, she has no interest in and quickly stopped reading. She also is not interested in crossword puzzles or brain teasers.  She does not have any hallucinations, delusions, or change in personality.  Last visit in July 2014, her MOCA score was 15/30.  She was unable to afford Namenda, so we started Aricept instead.  She has been tolerating the Aricept and is doing well.  She feels well.  Her blood pressure is under control and she feels stronger.  She denies depression and sleeps through the night, about 9-10 hours, and is rested.  She lives alone but her son stays with her during the day.  She doesn't go out much during the day but she remains active and able to ambulate with the walker much better.  PAST MEDICAL HISTORY: Past Medical History  Diagnosis Date  . Status post AAA (abdominal aortic aneurysm) repair 01/02/2013  . PAD (peripheral artery disease) 01/02/2013  . Osteoporosis, unspecified 01/02/2013  . Hyperlipemia 01/02/2013  . Hx of atrial flutter, on Xarelto 01/02/2013  . Essential hypertension, benign 01/02/2013  . Chronic kidney disease 01/02/2013  . Breast cancer 01/02/2013  . COPD (chronic obstructive pulmonary disease) 01/02/2013  . Dementia, on Namenda briefly in the past per review of records 01/02/2013  .  GERD (gastroesophageal reflux disease) 01/02/2013  . Iron deficiency anemia 01/02/2013  . Unspecified hypothyroidism 01/02/2013    MEDICATIONS: Current Outpatient Prescriptions on File Prior to Visit  Medication Sig Dispense Refill  . ADVAIR DISKUS 100-50 MCG/DOSE AEPB INHALE 1 PUFF INTO THE LUNGS EVERY 12 (TWELVE) HOURS.  60 each  5  . atorvastatin (LIPITOR) 20 MG  tablet Take 1 tablet (20 mg total) by mouth daily at 6 PM.  90 tablet  3  . cholecalciferol (VITAMIN D) 1000 UNITS tablet Take 1,000 Units by mouth once a week. On tuesday      . donepezil (ARICEPT) 10 MG tablet Take one tablet (10 mg) by mouth at bedtime.  30 tablet  3  . furosemide (LASIX) 40 MG tablet TAKE 1 TABLET BY MOUTH EVERY DAY  90 tablet  1  . lactulose (CHRONULAC) 10 GM/15ML solution 30 g. 30 ml every 4 hours as needed for constipation      . levothyroxine (SYNTHROID, LEVOTHROID) 25 MCG tablet TAKE 1.5 TABLETS BY MOUTH DAILY BEFORE BREAKFAST  90 tablet  1  . Melatonin 3 MG TABS Take 3 mg by mouth at bedtime.      . metoprolol (LOPRESSOR) 100 MG tablet TAKE 1 TABLET BY MOUTH TWICE A DAY  180 tablet  1  . Rivaroxaban (XARELTO) 15 MG TABS tablet Take 1 tablet (15 mg total) by mouth daily.  30 tablet  11  . vitamin C (ASCORBIC ACID) 500 MG tablet Take 500 mg by mouth daily.       No current facility-administered medications on file prior to visit.    ALLERGIES: No Known Allergies  FAMILY HISTORY: Family History  Problem Relation Age of Onset  . Cancer Mother     lung  . Heart disease Father 61    SOCIAL HISTORY: History   Social History  . Marital Status: Widowed    Spouse Name: N/A    Number of Children: N/A  . Years of Education: N/A   Occupational History  . Not on file.   Social History Main Topics  . Smoking status: Former Smoker    Quit date: 02/16/2008  . Smokeless tobacco: Never Used  . Alcohol Use: No  . Drug Use: No  . Sexual Activity: Not on file   Other Topics Concern  . Not on file   Social History Narrative   Home Situation: living with daughter [name])      Spiritual Beliefs: none      Lifestyle: get around well in the house - uses a walker sometimes, has not had a history of any falls, has some mild dementia. She needs help with bathing. She needs some help with dressing. She does not do her own cooking or cleaning. Does not drive. She did  manage all of her finances until 10/2012.             REVIEW OF SYSTEMS: Constitutional: No fevers, chills, or sweats, no generalized fatigue, change in appetite Eyes: No visual changes, double vision, eye pain Ear, nose and throat: No hearing loss, ear pain, nasal congestion, sore throat Cardiovascular: No chest pain, palpitations Respiratory:  No shortness of breath at rest or with exertion, wheezes GastrointestinaI: No nausea, vomiting, diarrhea, abdominal pain, fecal incontinence Genitourinary:  No dysuria, urinary retention or frequency Musculoskeletal:  No neck pain, back pain Integumentary: No rash, pruritus, skin lesions Neurological: as above Psychiatric: No depression, insomnia, anxiety Endocrine: No palpitations, fatigue, diaphoresis, mood swings, change in appetite, change in weight,  increased thirst Hematologic/Lymphatic:  No anemia, purpura, petechiae. Allergic/Immunologic: no itchy/runny eyes, nasal congestion, recent allergic reactions, rashes  PHYSICAL EXAM: Filed Vitals:   07/26/13 0932  BP: 130/60  Pulse: 60  Temp: 97.5 F (36.4 C)   General: No acute distress Head:  Normocephalic/atraumatic Neck: supple, no paraspinal tenderness, full range of motion Heart:  Regular rate and rhythm Lungs:  Clear to auscultation bilaterally Back: No paraspinal tenderness Neurological Exam: alert and oriented to person, time and place (except for town and county).  Able to name, repeat, read, write and follow 3 step commands across midline.  Able to spell WORLD backwards.  Unable to recall 3 out of 3 words.  Able to copy intersecting pentagons correctly.  MMSE 25/30.  Does have pinpoint pupils, but CN II-XII intact.  Bulk and tone normal, muscle strength 5/5 throughout.  Sensation to light touch, temperature and vibration intact.  Finger to nose and heel to shin testing intact.  Gait with normal stride and steady.  IMPRESSION: Alzheimer's dementia  PLAN: 1.  Continue Aricept  10mg  daily 2.  Try to remain active and stimulated during the day. 3.  Follow up in 9 months or as needed  Metta Clines, DO  CC:  Colin Benton, DO

## 2013-08-20 ENCOUNTER — Encounter: Payer: Self-pay | Admitting: Family Medicine

## 2013-08-20 ENCOUNTER — Ambulatory Visit (INDEPENDENT_AMBULATORY_CARE_PROVIDER_SITE_OTHER): Payer: Medicare Other | Admitting: Family Medicine

## 2013-08-20 VITALS — BP 140/70 | HR 57 | Temp 98.6°F | Wt 124.0 lb

## 2013-08-20 DIAGNOSIS — E785 Hyperlipidemia, unspecified: Secondary | ICD-10-CM

## 2013-08-20 DIAGNOSIS — F0390 Unspecified dementia without behavioral disturbance: Secondary | ICD-10-CM

## 2013-08-20 DIAGNOSIS — E039 Hypothyroidism, unspecified: Secondary | ICD-10-CM

## 2013-08-20 DIAGNOSIS — J309 Allergic rhinitis, unspecified: Secondary | ICD-10-CM

## 2013-08-20 DIAGNOSIS — Z8679 Personal history of other diseases of the circulatory system: Secondary | ICD-10-CM

## 2013-08-20 DIAGNOSIS — N189 Chronic kidney disease, unspecified: Secondary | ICD-10-CM

## 2013-08-20 DIAGNOSIS — I10 Essential (primary) hypertension: Secondary | ICD-10-CM

## 2013-08-20 NOTE — Progress Notes (Signed)
Chief Complaint  Patient presents with  . 4 month follow up  . Cough    HPI:  Follow up:  HTN/HLD/PAD/HX A. 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 Tomi Likens in neurology -reviewed recent OV note - on 10mg  aricept and stable per notes  CKI: -stable with baseline cr around 1.7, improved last check  COPD: -used advair daily  Breast cancer: -followed by onc, triple neg s/p surgical intervention  Cough: -with weather change for last few weeks -nasal congestion, productive coughing, PND -denies: SOB, fevers, sinus pain, ear pain, tooth pain, malaise -does not seem to be her COPD  Hx iron def anemia and hypokal- resolved, labs normal last check at last visit  HM: -pt and daughter decline all HM measures  ROS: See pertinent positives and negatives per HPI.  Past Medical History  Diagnosis Date  . Status post AAA (abdominal aortic aneurysm) repair 01/02/2013  . PAD (peripheral artery disease) 01/02/2013  . Osteoporosis, unspecified 01/02/2013  . Hyperlipemia 01/02/2013  . Hx of atrial flutter, on Xarelto 01/02/2013  . Essential hypertension, benign 01/02/2013  . Chronic kidney disease 01/02/2013  . Breast cancer 01/02/2013  . COPD (chronic obstructive pulmonary disease) 01/02/2013  . Dementia, on Namenda briefly in the past per review of records 01/02/2013  . GERD (gastroesophageal reflux disease) 01/02/2013  . Iron deficiency anemia 01/02/2013  . Unspecified hypothyroidism 01/02/2013    Past Surgical History  Procedure Laterality Date  . Abdominal aortic aneurysm repair    . Breast surgery      right lumpectomy    Family History  Problem Relation Age of Onset  . Cancer Mother     lung  . Heart disease Father 71    History   Social History  . Marital  Status: Widowed    Spouse Name: N/A    Number of Children: N/A  . Years of Education: N/A   Social History Main Topics  . Smoking status: Former Smoker    Quit date: 02/16/2008  . Smokeless tobacco: Never Used  . Alcohol Use: No  . Drug Use: No  . Sexual Activity: None   Other Topics Concern  . None   Social History Narrative   Home Situation: living with daughter [name])      Spiritual Beliefs: none      Lifestyle: get around well in the house - uses a walker sometimes, has not had a history of any falls, has some mild dementia. She needs help with bathing. She needs some help with dressing. She does not do her own cooking or cleaning. Does not drive. She did manage all of her finances until 10/2012.             Current outpatient prescriptions:ADVAIR DISKUS 100-50 MCG/DOSE AEPB, INHALE 1 PUFF INTO THE LUNGS EVERY 12 (TWELVE) HOURS., Disp: 60 each, Rfl: 5;  amLODipine (NORVASC) 5 MG tablet, , Disp: , Rfl: ;  atorvastatin (LIPITOR) 20 MG tablet, Take 1 tablet (20 mg total) by mouth daily at 6 PM., Disp: 90 tablet, Rfl: 3;  cholecalciferol (VITAMIN D) 1000 UNITS tablet, Take 1,000 Units by mouth once a week. On tuesday, Disp: , Rfl:  donepezil (ARICEPT) 10 MG tablet, Take one tablet (10 mg) by mouth at bedtime., Disp: 30 tablet, Rfl: 3;  furosemide (LASIX) 40 MG tablet, TAKE 1 TABLET  BY MOUTH EVERY DAY, Disp: 90 tablet, Rfl: 1;  lactulose (CHRONULAC) 10 GM/15ML solution, 30 g. 30 ml every 4 hours as needed for constipation, Disp: , Rfl: ;  levothyroxine (SYNTHROID, LEVOTHROID) 25 MCG tablet, TAKE 1.5 TABLETS BY MOUTH DAILY BEFORE BREAKFAST, Disp: 90 tablet, Rfl: 1 Melatonin 3 MG TABS, Take 3 mg by mouth at bedtime., Disp: , Rfl: ;  metoprolol (LOPRESSOR) 100 MG tablet, TAKE 1 TABLET BY MOUTH TWICE A DAY, Disp: 180 tablet, Rfl: 1;  Rivaroxaban (XARELTO) 15 MG TABS tablet, Take 1 tablet (15 mg total) by mouth daily., Disp: 30 tablet, Rfl: 11;  vitamin C (ASCORBIC ACID) 500 MG tablet, Take  500 mg by mouth daily., Disp: , Rfl:   EXAM:  Filed Vitals:   08/20/13 1319  BP: 140/70  Pulse: 57  Temp: 98.6 F (37 C)    Body mass index is 20.63 kg/(m^2).  GENERAL: vitals reviewed and listed above, alert, oriented, appears well hydrated and in no acute distress  HEENT: atraumatic, conjunttiva clear, no obvious abnormalities on inspection of external nose and ears, normal appearance of ear canals and TMs, clear nasal congestion, mild post oropharyngeal erythema with PND, no tonsillar edema or exudate, no sinus TTP  NECK: no obvious masses on inspection  LUNGS: clear to auscultation bilaterally, no wheezes, rales or rhonchi, good air movement  CV: HRRR, no peripheral edema  MS: moves all extremities without noticeable abnormality  PSYCH: pleasant and cooperative, no obvious depression or anxiety  ASSESSMENT AND PLAN:  Discussed the following assessment and plan:  Allergic rhinitis  Essential hypertension, benign  Hx of atrial flutter, on Xarelto  Hyperlipemia  Chronic kidney disease  Unspecified hypothyroidism  Dementia, on Namenda briefly in the past per review of records  -suspect PND and ar or mild URI viral as cause of symptoms and offered INS but they prefer not to treat this - advised if cough persists in 1 month or if any worsening to call us -offered labs but they would prefer to wait until next visit -all health maintenance measures refused by pt and daughter -follow up 3 months  -Patient advised to return or notify a doctor immediately if symptoms worsen or persist or new concerns arise.  Patient Instructions  -please call us if you still have this cough in 1 month  -follow up in 3 months     Vyla Pint R.

## 2013-08-20 NOTE — Patient Instructions (Signed)
-  please call us if you still have this cough in 1 month  -follow up in 3 months

## 2013-08-20 NOTE — Progress Notes (Signed)
Pre visit review using our clinic review tool, if applicable. No additional management support is needed unless otherwise documented below in the visit note. 

## 2013-08-21 ENCOUNTER — Telehealth: Payer: Self-pay | Admitting: Family Medicine

## 2013-08-21 NOTE — Telephone Encounter (Signed)
Relevant patient education assigned to patient using Emmi. ° °

## 2013-09-23 ENCOUNTER — Telehealth: Payer: Self-pay | Admitting: Family Medicine

## 2013-09-23 MED ORDER — LEVOTHYROXINE SODIUM 25 MCG PO TABS: 25.0000 ug | ORAL_TABLET | Freq: Every day | ORAL | Status: DC

## 2013-09-23 NOTE — Telephone Encounter (Signed)
Pt needs refill on levothyroxine 25 mcg #90 w/refills [facility]

## 2013-09-23 NOTE — Telephone Encounter (Signed)
Rx sent to pharmacy   

## 2013-11-12 ENCOUNTER — Telehealth: Payer: Self-pay | Admitting: Family Medicine

## 2013-11-12 NOTE — Telephone Encounter (Signed)
Pt's daughter is calling in regards to rx levothyroxine (SYNTHROID, LEVOTHROID) 25 MCG tablet, daughter states she needs verification on the dosage states she has been given her mother the same dosage which was 1 pill and 1/2 however she states it was changed and she wasn't aware, the new rx states only 1 pill a day, so pt will be out of the rx next week.

## 2013-11-13 MED ORDER — LEVOTHYROXINE SODIUM 25 MCG PO TABS: 37.5000 ug | ORAL_TABLET | Freq: Every day | ORAL | Status: DC

## 2013-11-13 NOTE — Telephone Encounter (Signed)
Pt rx for synthroid changed back to 37.5 mg.  Did not see in dr's notes where rx was changed to 25 mg

## 2013-11-15 ENCOUNTER — Telehealth: Payer: Self-pay | Admitting: Neurology

## 2013-11-15 NOTE — Telephone Encounter (Signed)
Aricept 10 #30 1 at bedtime called to pharmacy with 3 refills

## 2013-11-15 NOTE — Telephone Encounter (Signed)
Pharmacy calling for status of Aricept refill. Please CB [phone] / Sherri S.

## 2013-11-19 ENCOUNTER — Encounter: Payer: Self-pay | Admitting: Family Medicine

## 2013-11-19 ENCOUNTER — Ambulatory Visit (INDEPENDENT_AMBULATORY_CARE_PROVIDER_SITE_OTHER): Payer: Medicare Other | Admitting: Family Medicine

## 2013-11-19 VITALS — BP 130/62 | HR 52 | Temp 98.2°F | Ht 65.5 in | Wt 139.5 lb

## 2013-11-19 DIAGNOSIS — G309 Alzheimer's disease, unspecified: Secondary | ICD-10-CM

## 2013-11-19 DIAGNOSIS — E039 Hypothyroidism, unspecified: Secondary | ICD-10-CM

## 2013-11-19 DIAGNOSIS — K219 Gastro-esophageal reflux disease without esophagitis: Secondary | ICD-10-CM

## 2013-11-19 DIAGNOSIS — F0280 Dementia in other diseases classified elsewhere without behavioral disturbance: Secondary | ICD-10-CM

## 2013-11-19 DIAGNOSIS — I10 Essential (primary) hypertension: Secondary | ICD-10-CM

## 2013-11-19 DIAGNOSIS — E785 Hyperlipidemia, unspecified: Secondary | ICD-10-CM

## 2013-11-19 DIAGNOSIS — F0390 Unspecified dementia without behavioral disturbance: Secondary | ICD-10-CM

## 2013-11-19 DIAGNOSIS — N189 Chronic kidney disease, unspecified: Secondary | ICD-10-CM

## 2013-11-19 DIAGNOSIS — Z8679 Personal history of other diseases of the circulatory system: Secondary | ICD-10-CM

## 2013-11-19 DIAGNOSIS — M810 Age-related osteoporosis without current pathological fracture: Secondary | ICD-10-CM

## 2013-11-19 DIAGNOSIS — J449 Chronic obstructive pulmonary disease, unspecified: Secondary | ICD-10-CM

## 2013-11-19 LAB — BASIC METABOLIC PANEL
BUN: 32 mg/dL — ABNORMAL HIGH (ref 6–23)
CO2: 30 meq/L (ref 19–32)
Calcium: 9 mg/dL (ref 8.4–10.5)
Chloride: 101 mEq/L (ref 96–112)
Creatinine, Ser: 2 mg/dL — ABNORMAL HIGH (ref 0.4–1.2)
GFR: 25.19 mL/min — ABNORMAL LOW (ref >60.00)
Glucose, Bld: 112 mg/dL — ABNORMAL HIGH (ref 70–99)
POTASSIUM: 3.6 meq/L (ref 3.5–5.1)
SODIUM: 140 meq/L (ref 135–145)

## 2013-11-19 LAB — LIPID PANEL
CHOL/HDL RATIO: 3
Cholesterol: 158 mg/dL (ref 0–200)
HDL: 56.8 mg/dL (ref >39.00)
LDL Cholesterol: 68 mg/dL (ref 0–99)
Triglycerides: 165 mg/dL — ABNORMAL HIGH (ref 0.0–149.0)
VLDL: 33 mg/dL (ref 0.0–40.0)

## 2013-11-19 LAB — TSH: TSH: 0.41 u[IU]/mL (ref 0.35–4.50)

## 2013-11-19 MED ORDER — FLUTICASONE-SALMETEROL 100-50 MCG/DOSE IN AEPB: INHALATION_SPRAY | RESPIRATORY_TRACT | Status: DC

## 2013-11-19 NOTE — Progress Notes (Signed)
Pre visit review using our clinic review tool, if applicable. No additional management support is needed unless otherwise documented below in the visit note. 

## 2013-11-19 NOTE — Progress Notes (Signed)
No chief complaint on file.   HPI:  HTN/HLD/PAD/HX A. Flutter:  -followed by cards  -reviewed last cards notes  -continues xarelto, norvasc, lasix, metorpolol, pot-klor, lipitor  -denies: CP, SOb, DOE, palpitations, edema  Lab Results  Component Value Date   CHOL 184 04/17/2013   HDL 54.60 04/17/2013   LDLCALC 97 04/17/2013   TRIG 160.0* 04/17/2013   CHOLHDL 3 04/17/2013    Hypothyoroid:  -on synthroid 37.10mcg daily Lab Results  Component Value Date   TSH 1.14 04/17/2013   Dementia:  -on aricept, followed by Dr. Tomi Likens in neurology  -reviewed recent OV note - on 10mg  aricept and stable per notes   CKI:  -stable with baseline cr around 1.7, improved last check  Lab Results  Component Value Date   CREATININE 1.4* 04/17/2013   COPD:  -used advair daily   Breast cancer:  -followed by onc, triple neg s/p surgical intervention   Cough:  -at last visit in feb -resolved  ROS: See pertinent positives and negatives per HPI.  Past Medical History  Diagnosis Date  . Status post AAA (abdominal aortic aneurysm) repair 01/02/2013  . PAD (peripheral artery disease) 01/02/2013  . Osteoporosis, unspecified 01/02/2013  . Hyperlipemia 01/02/2013  . Hx of atrial flutter, on Xarelto 01/02/2013  . Essential hypertension, benign 01/02/2013  . Chronic kidney disease 01/02/2013  . Breast cancer 01/02/2013  . COPD (chronic obstructive pulmonary disease) 01/02/2013  . Dementia, on Namenda briefly in the past per review of records 01/02/2013  . GERD (gastroesophageal reflux disease) 01/02/2013  . Iron deficiency anemia 01/02/2013  . Unspecified hypothyroidism 01/02/2013    Past Surgical History  Procedure Laterality Date  . Abdominal aortic aneurysm repair    . Breast surgery      right lumpectomy    Family History  Problem Relation Age of Onset  . Cancer Mother     lung  . Heart disease Father 10    History   Social History  . Marital Status: Widowed    Spouse Name: N/A    Number of  Children: N/A  . Years of Education: N/A   Social History Main Topics  . Smoking status: Former Smoker    Quit date: 02/16/2008  . Smokeless tobacco: Never Used  . Alcohol Use: No  . Drug Use: No  . Sexual Activity: None   Other Topics Concern  . None   Social History Narrative   Home Situation: living with daughter [name])      Spiritual Beliefs: none      Lifestyle: get around well in the house - uses a walker sometimes, has not had a history of any falls, has some mild dementia. She needs help with bathing. She needs some help with dressing. She does not do her own cooking or cleaning. Does not drive. She did manage all of her finances until 10/2012.             Current outpatient prescriptions:amLODipine (NORVASC) 5 MG tablet, , Disp: , Rfl: ;  atorvastatin (LIPITOR) 20 MG tablet, Take 1 tablet (20 mg total) by mouth daily at 6 PM., Disp: 90 tablet, Rfl: 3;  cholecalciferol (VITAMIN D) 1000 UNITS tablet, Take 1,000 Units by mouth once a week. On tuesday, Disp: , Rfl: ;  donepezil (ARICEPT) 10 MG tablet, Take one tablet (10 mg) by mouth at bedtime., Disp: 30 tablet, Rfl: 3 Fluticasone-Salmeterol (ADVAIR DISKUS) 100-50 MCG/DOSE AEPB, INHALE 1 PUFF INTO THE LUNGS EVERY 12 (TWELVE) HOURS., Disp: 60 each,  Rfl: 5;  furosemide (LASIX) 40 MG tablet, TAKE 1 TABLET BY MOUTH EVERY DAY, Disp: 90 tablet, Rfl: 1;  lactulose (CHRONULAC) 10 GM/15ML solution, 30 g. 30 ml every 4 hours as needed for constipation, Disp: , Rfl:  levothyroxine (LEVOTHROID) 25 MCG tablet, Take 1.5 tablets (37.5 mcg total) by mouth daily before breakfast., Disp: 90 tablet, Rfl: 5;  Melatonin 3 MG TABS, Take 3 mg by mouth at bedtime., Disp: , Rfl: ;  metoprolol (LOPRESSOR) 100 MG tablet, TAKE 1 TABLET BY MOUTH TWICE A DAY, Disp: 180 tablet, Rfl: 1;  Rivaroxaban (XARELTO) 15 MG TABS tablet, Take 1 tablet (15 mg total) by mouth daily., Disp: 30 tablet, Rfl: 11 vitamin C (ASCORBIC ACID) 500 MG tablet, Take 500 mg by mouth  daily., Disp: , Rfl:   EXAM:  Filed Vitals:   11/19/13 1259  BP: 130/62  Pulse: 52  Temp: 98.2 F (36.8 C)    Body mass index is 22.85 kg/(m^2).  GENERAL: vitals reviewed and listed above, alert, oriented, appears well hydrated and in no acute distress  HEENT: atraumatic, conjunttiva clear, no obvious abnormalities on inspection of external nose and ears  NECK: no obvious masses on inspection  LUNGS: clear to auscultation bilaterally, no wheezes, rales or rhonchi, good air movement  CV: HRRR, no peripheral edema  MS: moves all extremities without noticeable abnormality  PSYCH: pleasant and cooperative, no obvious depression or anxiety  ASSESSMENT AND PLAN:  Discussed the following assessment and plan:  Essential hypertension, benign - Plan: Basic metabolic panel  Hx of atrial flutter, on Xarelto  Hyperlipemia - Plan: Lipid Panel  Osteoporosis, unspecified, on fosamax >5 years in the past per PCP notes  Chronic kidney disease  Unspecified hypothyroidism - Plan: TSH  GERD (gastroesophageal reflux disease)  Dementia, on Namenda briefly in the past per review of records  COPD (chronic obstructive pulmonary disease)  Alzheimer's disease  -NON-fasting labs today -refilled medications -follow up in 4- 6 months -Patient advised to return or notify a doctor immediately if symptoms worsen or persist or new concerns arise.  Patient Instructions  -We have ordered labs or studies at this visit. It can take up to 1-2 weeks for results and processing. We will contact you with instructions IF your results are abnormal. Normal results will be released to your [facility]. If you have not heard from [place] or can not find your results in [facility] in 2 weeks please contact our office.  -PLEASE SIGN UP FOR MYCHART TODAY   We recommend the following healthy lifestyle measures: - eat a healthy diet consisting of lots of vegetables, fruits, beans, nuts, seeds, healthy meats such as  white chicken and fish and whole grains.  - avoid fried foods, fast food, processed foods, sodas, red meet and other fattening foods.  - get a least 150 minutes of aerobic exercise per week.   Follow up in: about 4 months for your [facility] Annual Wellness Exam      Caroline Underwood

## 2013-11-19 NOTE — Patient Instructions (Signed)
-  We have ordered labs or studies at this visit. It can take up to 1-2 weeks for results and processing. We will contact you with instructions IF your results are abnormal. Normal results will be released to your [facility]. If you have not heard from [place] or can not find your results in [facility] in 2 weeks please contact our office.  -PLEASE SIGN UP FOR MYCHART TODAY   We recommend the following healthy lifestyle measures: - eat a healthy diet consisting of lots of vegetables, fruits, beans, nuts, seeds, healthy meats such as white chicken and fish and whole grains.  - avoid fried foods, fast food, processed foods, sodas, red meet and other fattening foods.  - get a least 150 minutes of aerobic exercise per week.   Follow up in: about 4 months for your Medicare Annual Wellness Exam

## 2013-11-22 ENCOUNTER — Other Ambulatory Visit: Payer: Self-pay | Admitting: *Deleted

## 2013-11-22 DIAGNOSIS — N189 Chronic kidney disease, unspecified: Secondary | ICD-10-CM

## 2014-01-02 ENCOUNTER — Other Ambulatory Visit (INDEPENDENT_AMBULATORY_CARE_PROVIDER_SITE_OTHER): Payer: Medicare Other

## 2014-01-02 DIAGNOSIS — N189 Chronic kidney disease, unspecified: Secondary | ICD-10-CM

## 2014-01-02 LAB — BASIC METABOLIC PANEL
BUN: 25 mg/dL — ABNORMAL HIGH (ref 6–23)
CHLORIDE: 100 meq/L (ref 96–112)
CO2: 27 mEq/L (ref 19–32)
Calcium: 9 mg/dL (ref 8.4–10.5)
Creatinine, Ser: 1.8 mg/dL — ABNORMAL HIGH (ref 0.4–1.2)
GFR: 28.66 mL/min — ABNORMAL LOW (ref >60.00)
Glucose, Bld: 150 mg/dL — ABNORMAL HIGH (ref 70–99)
POTASSIUM: 3.4 meq/L — AB (ref 3.5–5.1)
SODIUM: 138 meq/L (ref 135–145)

## 2014-01-07 ENCOUNTER — Other Ambulatory Visit: Payer: Self-pay | Admitting: Family Medicine

## 2014-01-09 NOTE — Telephone Encounter (Signed)
Dr Kim-I did not see where you prescribed the Amlodipine for the pt.

## 2014-02-07 ENCOUNTER — Telehealth: Payer: Self-pay | Admitting: Family Medicine

## 2014-02-07 ENCOUNTER — Other Ambulatory Visit: Payer: Self-pay | Admitting: Neurology

## 2014-02-07 MED ORDER — DONEPEZIL HCL 10 MG PO TABS: ORAL_TABLET | ORAL | Status: DC

## 2014-02-07 MED ORDER — ATORVASTATIN CALCIUM 20 MG PO TABS: 20.0000 mg | ORAL_TABLET | Freq: Every day | ORAL | Status: DC

## 2014-02-07 NOTE — Telephone Encounter (Signed)
Rx sent to the pts pharmacy.

## 2014-02-07 NOTE — Telephone Encounter (Signed)
[id number] [place], [place] - [address] is requesting 90 day re-fill on atorvastatin (LIPITOR) 20 MG tablet

## 2014-02-07 NOTE — Telephone Encounter (Signed)
Received request from pharmacy for 90 day supply of Aricept. Approved and sent to [facility].

## 2014-03-10 ENCOUNTER — Encounter: Payer: Self-pay | Admitting: Cardiovascular Disease

## 2014-03-10 ENCOUNTER — Ambulatory Visit (INDEPENDENT_AMBULATORY_CARE_PROVIDER_SITE_OTHER): Payer: Medicare Other | Admitting: Cardiovascular Disease

## 2014-03-10 VITALS — BP 122/60 | HR 50 | Ht 65.5 in | Wt 126.1 lb

## 2014-03-10 DIAGNOSIS — I4891 Unspecified atrial fibrillation: Secondary | ICD-10-CM

## 2014-03-10 DIAGNOSIS — E785 Hyperlipidemia, unspecified: Secondary | ICD-10-CM

## 2014-03-10 DIAGNOSIS — N183 Chronic kidney disease, stage 3 unspecified: Secondary | ICD-10-CM

## 2014-03-10 DIAGNOSIS — I10 Essential (primary) hypertension: Secondary | ICD-10-CM

## 2014-03-10 DIAGNOSIS — I482 Chronic atrial fibrillation, unspecified: Secondary | ICD-10-CM

## 2014-03-10 MED ORDER — METOPROLOL TARTRATE 50 MG PO TABS: 50.0000 mg | ORAL_TABLET | Freq: Two times a day (BID) | ORAL | Status: DC

## 2014-03-10 NOTE — Patient Instructions (Addendum)
Check the price of the following anticoagulants:  Pradaxa 150 mg twice a day Xarelto 15 mg a day Eliquis 5 mg twice a day Savaysa 60 mg a day.  Your physician has recommended you make the following change in your medication:  DECREASE Metoprolol to 50 mg twice daily  Your physician wants you to follow-up in: 1 year with Dr. Acie Fredrickson.   You will receive a reminder letter in the mail two months in advance. If you don't receive a letter, please call our office to schedule the follow-up appointment.

## 2014-03-10 NOTE — Assessment & Plan Note (Signed)
Creatinine is stable.

## 2014-03-10 NOTE — Progress Notes (Signed)
Caroline Underwood Date of Birth  06/24/1922       [facility] [address], [place],   [address]   [place],   [address] [phone]     [phone]   Fax  [phone]    Fax [phone]  Problem List: History of atrial flutter 2. Status post abdominal aortic aneurysm repair 3. Hyperlipidemia 4. COPD 5. Breast cancer - Dx May, 2014 6. Chronic kidney disease 7. Dementia 8. Hypertension  History of Present Illness:   [name] is an 80 yo with the above noted medical history.   She moved from [place], [place] to [place] 8 weeks ago.    She has been on Xarelto for an unknown period of time.  Apparently the records from [place] commented that she had atrial flutter.    She had breast cancer surgery in May 2014 .  She did not receive any chemo or XRT.  ( triple negative breast CA).  She walks with the assistance of a walker - does not fall.  She was on multiple BP medications and was orthostasis in JUne /  July 2014 but her daughter stopped most of her BP meds.  Clonidine was cut   a significant amount.  Her orthostasis has improved.    March 10, 2014:  Patient is doing great . Able to walk on her own  - uses a walker on occasion.   Does have some DOE   Current Outpatient Prescriptions on File Prior to Visit  Medication Sig Dispense Refill  . amLODipine (NORVASC) 5 MG tablet TAKE 1 TABLET BY MOUTH EVERY DAY  90 tablet  1  . atorvastatin (LIPITOR) 20 MG tablet Take 1 tablet (20 mg total) by mouth daily at 6 PM.  90 tablet  0  . cholecalciferol (VITAMIN D) 1000 UNITS tablet Take 1,000 Units by mouth once a week. On tuesday      . donepezil (ARICEPT) 10 MG tablet Take one tablet (10 mg) by mouth at bedtime.  90 tablet  3  . Fluticasone-Salmeterol (ADVAIR DISKUS) 100-50 MCG/DOSE AEPB INHALE 1 PUFF INTO THE LUNGS EVERY 12 (TWELVE) HOURS.  60 each  5  . furosemide (LASIX) 40 MG tablet TAKE 1 TABLET  BY MOUTH EVERY DAY  90 tablet  1  . levothyroxine (LEVOTHROID) 25 MCG tablet Take 1.5 tablets (37.5 mcg total) by mouth daily before breakfast.  90 tablet  5  . Melatonin 3 MG TABS Take 3 mg by mouth at bedtime.      . metoprolol (LOPRESSOR) 100 MG tablet TAKE 1 TABLET BY MOUTH TWICE A DAY  180 tablet  1  . Rivaroxaban (XARELTO) 15 MG TABS tablet Take 1 tablet (15 mg total) by mouth daily.  30 tablet  11  . vitamin C (ASCORBIC ACID) 500 MG tablet Take 500 mg by mouth daily.       No current facility-administered medications on file prior to visit.    No Known Allergies  Past Medical History  Diagnosis Date  . Status post AAA (abdominal aortic aneurysm) repair 01/02/2013  . PAD (peripheral artery disease) 01/02/2013  . Osteoporosis, unspecified 01/02/2013  . Hyperlipemia 01/02/2013  . Hx of atrial flutter, on Xarelto 01/02/2013  . Essential hypertension, benign 01/02/2013  . Chronic kidney disease 01/02/2013  . Breast cancer 01/02/2013  . COPD (chronic obstructive pulmonary disease) 01/02/2013  . Dementia, on Namenda briefly in the past  per review of records 01/02/2013  . GERD (gastroesophageal reflux disease) 01/02/2013  . Iron deficiency anemia 01/02/2013  . Unspecified hypothyroidism 01/02/2013    Past Surgical History  Procedure Laterality Date  . Abdominal aortic aneurysm repair    . Breast surgery      right lumpectomy    History  Smoking status  . Former Smoker  . Quit date: 02/16/2008  Smokeless tobacco  . Never Used    History  Alcohol Use No    Family History  Problem Relation Age of Onset  . Cancer Mother     lung  . Heart disease Father 59    Reviw of Systems:  Reviewed in the HPI.  All other systems are negative.  Physical Exam: Blood pressure 122/60, pulse 50, height 5' 5.5" (1.664 m), weight 126 lb 1.9 oz (57.208 kg). General: Well developed, well nourished, in no acute distress.  Head: Normocephalic, atraumatic, sclera non-icteric, mucus membranes are  moist,   Neck: Supple. Carotids are 2 + without bruits. No JVD   Lungs: Clear   Heart: RR, normal S1, S2.  Abdomen: Soft, non-tender, non-distended with normal bowel sounds.  Msk:  Strength and tone are normal   Extremities: No clubbing or cyanosis. No edema.  Distal pedal pulses are 2+ and equal    Neuro: CN II - XII intact.  Alert and oriented X 3.   Psych:  Normal   ECG: February 15, 2013.  NSR at 60.    Assessment / Plan:

## 2014-03-10 NOTE — Assessment & Plan Note (Signed)
She's doing fairly well. She's quite steady on her feet is tolerating the Xarelto 15 mg without difficulty.  Will give her samples to help with the expense.    I will see her in 1 year.

## 2014-03-18 ENCOUNTER — Telehealth: Payer: Self-pay | Admitting: Oncology

## 2014-03-18 ENCOUNTER — Ambulatory Visit (INDEPENDENT_AMBULATORY_CARE_PROVIDER_SITE_OTHER): Payer: Medicare Other | Admitting: Family Medicine

## 2014-03-18 ENCOUNTER — Encounter: Payer: Self-pay | Admitting: Family Medicine

## 2014-03-18 ENCOUNTER — Telehealth: Payer: Self-pay | Admitting: *Deleted

## 2014-03-18 VITALS — BP 110/76 | HR 51 | Temp 97.6°F | Ht 65.5 in | Wt 126.0 lb

## 2014-03-18 DIAGNOSIS — N189 Chronic kidney disease, unspecified: Secondary | ICD-10-CM

## 2014-03-18 DIAGNOSIS — M810 Age-related osteoporosis without current pathological fracture: Secondary | ICD-10-CM

## 2014-03-18 DIAGNOSIS — Z0000 Encounter for general adult medical examination without abnormal findings: Secondary | ICD-10-CM

## 2014-03-18 DIAGNOSIS — I10 Essential (primary) hypertension: Secondary | ICD-10-CM

## 2014-03-18 DIAGNOSIS — F0391 Unspecified dementia with behavioral disturbance: Secondary | ICD-10-CM

## 2014-03-18 DIAGNOSIS — C50919 Malignant neoplasm of unspecified site of unspecified female breast: Secondary | ICD-10-CM

## 2014-03-18 DIAGNOSIS — F03918 Unspecified dementia, unspecified severity, with other behavioral disturbance: Secondary | ICD-10-CM

## 2014-03-18 DIAGNOSIS — E785 Hyperlipidemia, unspecified: Secondary | ICD-10-CM

## 2014-03-18 DIAGNOSIS — E039 Hypothyroidism, unspecified: Secondary | ICD-10-CM

## 2014-03-18 DIAGNOSIS — I4891 Unspecified atrial fibrillation: Secondary | ICD-10-CM

## 2014-03-18 DIAGNOSIS — L989 Disorder of the skin and subcutaneous tissue, unspecified: Secondary | ICD-10-CM

## 2014-03-18 LAB — BASIC METABOLIC PANEL
BUN: 19 mg/dL (ref 6–23)
CALCIUM: 9.5 mg/dL (ref 8.4–10.5)
CO2: 31 meq/L (ref 19–32)
CREATININE: 1.6 mg/dL — AB (ref 0.4–1.2)
Chloride: 101 mEq/L (ref 96–112)
GFR: 32.9 mL/min — AB (ref >60.00)
Glucose, Bld: 94 mg/dL (ref 70–99)
Potassium: 3.3 mEq/L — ABNORMAL LOW (ref 3.5–5.1)
Sodium: 141 mEq/L (ref 135–145)

## 2014-03-18 LAB — TSH: TSH: 0.56 u[IU]/mL (ref 0.35–4.50)

## 2014-03-18 MED ORDER — AMLODIPINE BESYLATE 5 MG PO TABS: 5.0000 mg | ORAL_TABLET | Freq: Every day | ORAL | Status: DC

## 2014-03-18 NOTE — Telephone Encounter (Signed)
Joann from [employer] cld to state that pt wanted to make f/u appt for pt-No pof in system for pt last visit-per office visit notes pt to be seen after mamma i May-adv Joann to let pt know I would sch a visit per notes in Nov-gave pt time & date in Nov but pt needs a mamma before appt-Joann related message to pt-pt undeerstood

## 2014-03-18 NOTE — Telephone Encounter (Signed)
I called [facility] and spoke with Clamensia and she scheduled the pt an appt for 05/22/2014 at 2:30pm with Dr Jana Hakim.  I gave the pts daugther this information and she was also advised to schedule the pts mammogram at the [facility] prior to this appt.

## 2014-03-18 NOTE — Progress Notes (Signed)
Medicare Annual Preventive Care Visit  (initial annual wellness or annual wellness exam)  Concerns and/or follow up today:  HTN/HLD/PAD/HX A. 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 Tomi Likens in neurology  -reviewed recent OV note - on 10mg  aricept and stable per notes -feels like her dementia is progressing -her son is feeding her and staying with her and aid coming in to bath her  CKI:  -stable with baseline cr around 1.7, improved last check   COPD:  -used advair daily   SKIN: -several lesions on arms and face -doesn't know if changing or new -does not want surgery or to see dermatology No itching or pain  Breast cancer:  -followed by onc, triple neg s/p surgical intervention  -daughter reports she has called the cancer center on numerous occassions, leaves messages to schedule appt and nobody returns her calls  ROS: negative for report of fevers, unintentional weight loss, vision changes, vision loss, hearing loss or change, chest pain, sob, hemoptysis, melena, hematochezia, hematuria, genital discharge or lesions, falls, bleeding or bruising, loc, thoughts of suicide or self harm, memory loss  1.) Patient-completed health risk assessment  - completed and reviewed, see scanned documentation  2.) Review of Medical History: -PMH, PSH, Family History and current specialty and care providers reviewed and updated and listed below  - see scanned in document in chart and below  Past Medical History  Diagnosis Date  . Status post AAA (abdominal aortic aneurysm)  repair 01/02/2013  . PAD (peripheral artery disease) 01/02/2013  . Osteoporosis, unspecified 01/02/2013  . Hyperlipemia 01/02/2013  . Hx of atrial flutter, on Xarelto 01/02/2013  . Essential hypertension, benign 01/02/2013  . Chronic kidney disease 01/02/2013  . Breast cancer 01/02/2013  . COPD (chronic obstructive pulmonary disease) 01/02/2013  . Dementia, on Namenda briefly in the past per review of records 01/02/2013  . GERD (gastroesophageal reflux disease) 01/02/2013  . Iron deficiency anemia 01/02/2013  . Unspecified hypothyroidism 01/02/2013    Past Surgical History  Procedure Laterality Date  . Abdominal aortic aneurysm repair    . Breast surgery      right lumpectomy    History   Social History  . Marital Status: Widowed    Spouse Name: N/A    Number of Children: N/A  . Years of Education: N/A   Occupational History  . Not on file.   Social History Main Topics  . Smoking status: Former Smoker    Quit date: 02/16/2008  . Smokeless tobacco: Never Used  . Alcohol Use: No  . Drug Use: No  . Sexual Activity: Not on file   Other Topics Concern  . Not on file   Social History Narrative   Home Situation: living with daughter [name])      Spiritual Beliefs: none      Lifestyle: get around well in the house - uses a walker sometimes, has not had a history of any falls, has some mild dementia. She needs help with bathing. She needs some help with dressing. She does  not do her own cooking or cleaning. Does not drive. She did manage all of her finances until 10/2012.             The patient has a family history of  3.) Review of functional ability and level of safety:  Any difficulty hearing?  NO  History of falling?  NO  Any trouble with IADLs - using a phone, using transportation, grocery shopping, preparing meals, doing housework, doing laundry, taking medications and managing money? YES   Advance Directives? daughter and caregiver is HCPOA  See summary of  recommendations in Patient Instructions below.  4.) Physical Exam Filed Vitals:   03/18/14 0812  BP: 110/76  Pulse: 51  Temp: 97.6 F (36.4 C)   Estimated body mass index is 20.64 kg/(m^2) as calculated from the following:   Height as of this encounter: 5' 5.5" (1.664 m).   Weight as of this encounter: 126 lb (57.153 kg).  EKG (optional): deferred  General: alert, appear well hydrated and in no acute distress  HEENT: visual acuity grossly intact  CV: HRRR  Lungs: CTA bilaterally  Psych: pleasant and cooperative, no obvious depression or anxiety  SKIN: R cheek AK, several AKs (3 on R and 2 on L)and SKs on arms, verrucous lesion R elbow  Mini Cog: Declined - seeing neurology for dementia  See patient instructions for recommendations.  Education and counseling regarding the above review of health provided with a plan for the following: -see scanned patient completed form for further details -fall prevention strategies discussed  -healthy lifestyle discussed -importance and resources for completing advanced directives discussed -see patient instructions below for any other recommendations provided  4)The following written screening schedule of preventive measures were reviewed with assessment and plan made per below, orders and patient instructions:      AAA screening: NA     Alcohol screening: done     Obesity Screening and counseling: done     STI screening: N/A     Tobacco Screening: done       Pneumococcal (PPSV23 -one dose after 64, one before if risk factors), influenza yearly and hepatitis B vaccines (if high risk - end stage renal disease, IV drugs, homosexual men, live in home for mentally retarded, hemophilia receiving factors) ASSESSMENT/PLAN: declined      Screening mammograph (yearly if >40) ASSESSMENT/PLAN:  Hx breast cancer, followed at breast center, will have assistant help to schedule appointment      Screening Pap smear/pelvic exam (q2  years) ASSESSMENT/PLAN: N/A      Prostate cancer screening ASSESSMENT/PLAN: n/a      Colorectal cancer screening (FOBT yearly or flex sig q4y or colonoscopy q10y or barium enema q4y) ASSESSMENT/PLAN: declined      Diabetes outpatient self-management training services ASSESSMENT/PLAN: decline      Bone mass measurements(covered q2y if indicated - estrogen def, osteoporosis, hyperparathyroid, vertebral abnormalities, osteoporosis or steroids) ASSESSMENT/PLAN:declined      Screening for glaucoma(q1y if high risk - diabetes, FH, AA and > 50 or hispanic and > 65) ASSESSMENT/PLAN:declined      Medical nutritional therapy for individuals with diabetes or renal disease ASSESSMENT/PLAN: n/a      Cardiovascular screening blood tests (lipids q5y) ASSESSMENT/PLAN: done      Diabetes screening tests ASSESSMENT/PLAN: done   7.) Summary: -risk factors and conditions per above assessment were discussed and treatment, recommendations and referrals were offered per documentation above and orders and patient instructions.  Medicare annual wellness visit, subsequent  Essential hypertension,  benign - Plan: Basic metabolic panel, amLODipine (NORVASC) 5 MG tablet  Breast cancer, unspecified laterality -my assistant is to call the breast center and assist daughter with scheduling appt  Atrial fibrillation, unspecified  Hyperlipemia  Osteoporosis, unspecified, on fosamax >5 years in the past per PCP notes  Chronic kidney disease, unspecified stage - Plan: Basic metabolic panel  Unspecified hypothyroidism - Plan: TSH  Dementia, with behavioral disturbance  Skin lesion -discussed potential etiologies and potential for cancer in some of the lesions - discussed tx options and they prefer LNs after informed consent to the AKs on cheek and arms and the verrucous lesion on elbow -follow up 1 month  There are no Patient Instructions on file for this visit.

## 2014-03-18 NOTE — Progress Notes (Signed)
Pre visit review using our clinic review tool, if applicable. No additional management support is needed unless otherwise documented below in the visit note. 

## 2014-04-23 ENCOUNTER — Emergency Department (HOSPITAL_COMMUNITY): Payer: Medicare Other

## 2014-04-23 ENCOUNTER — Inpatient Hospital Stay (HOSPITAL_COMMUNITY)
Admission: EM | Admit: 2014-04-23 | Discharge: 2014-04-27 | DRG: 199 | Disposition: A | Discharge status: Skilled Nursing Facility | Payer: Medicare Other | Attending: Internal Medicine | Admitting: Internal Medicine

## 2014-04-23 ENCOUNTER — Encounter (HOSPITAL_COMMUNITY): Payer: Self-pay | Admitting: Emergency Medicine

## 2014-04-23 ENCOUNTER — Observation Stay (HOSPITAL_COMMUNITY): Payer: Medicare Other

## 2014-04-23 DIAGNOSIS — K219 Gastro-esophageal reflux disease without esophagitis: Secondary | ICD-10-CM | POA: Diagnosis present

## 2014-04-23 DIAGNOSIS — D649 Anemia, unspecified: Secondary | ICD-10-CM | POA: Diagnosis present

## 2014-04-23 DIAGNOSIS — E876 Hypokalemia: Secondary | ICD-10-CM | POA: Diagnosis present

## 2014-04-23 DIAGNOSIS — D72829 Elevated white blood cell count, unspecified: Secondary | ICD-10-CM

## 2014-04-23 DIAGNOSIS — M25559 Pain in unspecified hip: Secondary | ICD-10-CM | POA: Diagnosis present

## 2014-04-23 DIAGNOSIS — M810 Age-related osteoporosis without current pathological fracture: Secondary | ICD-10-CM | POA: Diagnosis present

## 2014-04-23 DIAGNOSIS — Z853 Personal history of malignant neoplasm of breast: Secondary | ICD-10-CM

## 2014-04-23 DIAGNOSIS — Z79899 Other long term (current) drug therapy: Secondary | ICD-10-CM | POA: Diagnosis not present

## 2014-04-23 DIAGNOSIS — I481 Persistent atrial fibrillation: Secondary | ICD-10-CM

## 2014-04-23 DIAGNOSIS — K529 Noninfective gastroenteritis and colitis, unspecified: Secondary | ICD-10-CM

## 2014-04-23 DIAGNOSIS — I4819 Other persistent atrial fibrillation: Secondary | ICD-10-CM

## 2014-04-23 DIAGNOSIS — F0280 Dementia in other diseases classified elsewhere without behavioral disturbance: Secondary | ICD-10-CM

## 2014-04-23 DIAGNOSIS — I129 Hypertensive chronic kidney disease with stage 1 through stage 4 chronic kidney disease, or unspecified chronic kidney disease: Secondary | ICD-10-CM | POA: Diagnosis present

## 2014-04-23 DIAGNOSIS — Z87891 Personal history of nicotine dependence: Secondary | ICD-10-CM | POA: Diagnosis not present

## 2014-04-23 DIAGNOSIS — I739 Peripheral vascular disease, unspecified: Secondary | ICD-10-CM

## 2014-04-23 DIAGNOSIS — N184 Chronic kidney disease, stage 4 (severe): Secondary | ICD-10-CM | POA: Diagnosis present

## 2014-04-23 DIAGNOSIS — N189 Chronic kidney disease, unspecified: Secondary | ICD-10-CM | POA: Diagnosis present

## 2014-04-23 DIAGNOSIS — F0390 Unspecified dementia without behavioral disturbance: Secondary | ICD-10-CM

## 2014-04-23 DIAGNOSIS — J449 Chronic obstructive pulmonary disease, unspecified: Secondary | ICD-10-CM | POA: Diagnosis present

## 2014-04-23 DIAGNOSIS — D6959 Other secondary thrombocytopenia: Secondary | ICD-10-CM | POA: Diagnosis present

## 2014-04-23 DIAGNOSIS — Z809 Family history of malignant neoplasm, unspecified: Secondary | ICD-10-CM

## 2014-04-23 DIAGNOSIS — N183 Chronic kidney disease, stage 3 unspecified: Secondary | ICD-10-CM

## 2014-04-23 DIAGNOSIS — K921 Melena: Secondary | ICD-10-CM

## 2014-04-23 DIAGNOSIS — I4892 Unspecified atrial flutter: Secondary | ICD-10-CM | POA: Diagnosis present

## 2014-04-23 DIAGNOSIS — J9601 Acute respiratory failure with hypoxia: Secondary | ICD-10-CM | POA: Diagnosis present

## 2014-04-23 DIAGNOSIS — E039 Hypothyroidism, unspecified: Secondary | ICD-10-CM | POA: Diagnosis present

## 2014-04-23 DIAGNOSIS — E785 Hyperlipidemia, unspecified: Secondary | ICD-10-CM

## 2014-04-23 DIAGNOSIS — J438 Other emphysema: Secondary | ICD-10-CM

## 2014-04-23 DIAGNOSIS — E43 Unspecified severe protein-calorie malnutrition: Secondary | ICD-10-CM | POA: Diagnosis present

## 2014-04-23 DIAGNOSIS — G309 Alzheimer's disease, unspecified: Secondary | ICD-10-CM

## 2014-04-23 DIAGNOSIS — R1013 Epigastric pain: Secondary | ICD-10-CM

## 2014-04-23 DIAGNOSIS — I10 Essential (primary) hypertension: Secondary | ICD-10-CM

## 2014-04-23 DIAGNOSIS — D509 Iron deficiency anemia, unspecified: Secondary | ICD-10-CM

## 2014-04-23 DIAGNOSIS — Z8249 Family history of ischemic heart disease and other diseases of the circulatory system: Secondary | ICD-10-CM

## 2014-04-23 DIAGNOSIS — I4891 Unspecified atrial fibrillation: Secondary | ICD-10-CM | POA: Diagnosis present

## 2014-04-23 DIAGNOSIS — R262 Difficulty in walking, not elsewhere classified: Secondary | ICD-10-CM | POA: Diagnosis present

## 2014-04-23 DIAGNOSIS — Z8679 Personal history of other diseases of the circulatory system: Secondary | ICD-10-CM

## 2014-04-23 DIAGNOSIS — Z6820 Body mass index (BMI) 20.0-20.9, adult: Secondary | ICD-10-CM | POA: Diagnosis not present

## 2014-04-23 DIAGNOSIS — D7589 Other specified diseases of blood and blood-forming organs: Secondary | ICD-10-CM | POA: Diagnosis present

## 2014-04-23 DIAGNOSIS — J432 Centrilobular emphysema: Secondary | ICD-10-CM

## 2014-04-23 DIAGNOSIS — Z66 Do not resuscitate: Secondary | ICD-10-CM | POA: Diagnosis present

## 2014-04-23 DIAGNOSIS — M79606 Pain in leg, unspecified: Secondary | ICD-10-CM | POA: Diagnosis present

## 2014-04-23 DIAGNOSIS — F0391 Unspecified dementia with behavioral disturbance: Secondary | ICD-10-CM

## 2014-04-23 DIAGNOSIS — Z7901 Long term (current) use of anticoagulants: Secondary | ICD-10-CM

## 2014-04-23 DIAGNOSIS — M79604 Pain in right leg: Secondary | ICD-10-CM

## 2014-04-23 DIAGNOSIS — I480 Paroxysmal atrial fibrillation: Secondary | ICD-10-CM

## 2014-04-23 DIAGNOSIS — R109 Unspecified abdominal pain: Secondary | ICD-10-CM

## 2014-04-23 DIAGNOSIS — J939 Pneumothorax, unspecified: Principal | ICD-10-CM

## 2014-04-23 DIAGNOSIS — M79605 Pain in left leg: Secondary | ICD-10-CM

## 2014-04-23 DIAGNOSIS — M791 Myalgia: Secondary | ICD-10-CM | POA: Diagnosis present

## 2014-04-23 LAB — BASIC METABOLIC PANEL
Anion gap: 15 (ref 5–15)
BUN: 24 mg/dL — AB (ref 6–23)
CO2: 25 meq/L (ref 19–32)
CREATININE: 1.5 mg/dL — AB (ref 0.50–1.10)
Calcium: 9.3 mg/dL (ref 8.4–10.5)
Chloride: 99 mEq/L (ref 96–112)
GFR calc Af Amer: 34 mL/min — ABNORMAL LOW (ref >90)
GFR calc non Af Amer: 29 mL/min — ABNORMAL LOW (ref >90)
Glucose, Bld: 163 mg/dL — ABNORMAL HIGH (ref 70–99)
Potassium: 3.8 mEq/L (ref 3.7–5.3)
Sodium: 139 mEq/L (ref 137–147)

## 2014-04-23 LAB — URINALYSIS, ROUTINE W REFLEX MICROSCOPIC
BILIRUBIN URINE: NEGATIVE
GLUCOSE, UA: NEGATIVE mg/dL
KETONES UR: NEGATIVE mg/dL
Leukocytes, UA: NEGATIVE
NITRITE: NEGATIVE
PH: 7 (ref 5.0–8.0)
Protein, ur: >300 mg/dL — AB
SPECIFIC GRAVITY, URINE: 1.016 (ref 1.005–1.030)
Urobilinogen, UA: 0.2 mg/dL (ref 0.0–1.0)

## 2014-04-23 LAB — SEDIMENTATION RATE: SED RATE: 21 mm/h (ref 0–22)

## 2014-04-23 LAB — HEPATIC FUNCTION PANEL
ALT: 16 U/L (ref 0–35)
AST: 21 U/L (ref 0–37)
Albumin: 3.5 g/dL (ref 3.5–5.2)
Alkaline Phosphatase: 79 U/L (ref 39–117)
Bilirubin, Direct: <0.2 mg/dL (ref 0.0–0.3)
TOTAL PROTEIN: 6.8 g/dL (ref 6.0–8.3)
Total Bilirubin: 0.8 mg/dL (ref 0.3–1.2)

## 2014-04-23 LAB — CBC WITH DIFFERENTIAL/PLATELET
BASOS ABS: 0 10*3/uL (ref 0.0–0.1)
BASOS PCT: 0 % (ref 0–1)
EOS PCT: 1 % (ref 0–5)
Eosinophils Absolute: 0.1 10*3/uL (ref 0.0–0.7)
HCT: 37.7 % (ref 36.0–46.0)
Hemoglobin: 13.3 g/dL (ref 12.0–15.0)
LYMPHS PCT: 9 % — AB (ref 12–46)
Lymphs Abs: 1.5 10*3/uL (ref 0.7–4.0)
MCH: 31.8 pg (ref 26.0–34.0)
MCHC: 35.3 g/dL (ref 30.0–36.0)
MCV: 90.2 fL (ref 78.0–100.0)
MONO ABS: 1 10*3/uL (ref 0.1–1.0)
Monocytes Relative: 5 % (ref 3–12)
Neutro Abs: 15.4 10*3/uL — ABNORMAL HIGH (ref 1.7–7.7)
Neutrophils Relative %: 85 % — ABNORMAL HIGH (ref 43–77)
Platelets: 176 10*3/uL (ref 150–400)
RBC: 4.18 MIL/uL (ref 3.87–5.11)
RDW: 12.3 % (ref 11.5–15.5)
WBC: 18 10*3/uL — ABNORMAL HIGH (ref 4.0–10.5)

## 2014-04-23 LAB — TROPONIN I
Troponin I: <0.3 ng/mL (ref <0.30)
Troponin I: <0.3 ng/mL (ref <0.30)

## 2014-04-23 LAB — URINE MICROSCOPIC-ADD ON

## 2014-04-23 LAB — LIPASE, BLOOD: Lipase: 33 U/L (ref 11–59)

## 2014-04-23 LAB — MRSA PCR SCREENING: MRSA by PCR: NEGATIVE

## 2014-04-23 LAB — INFLUENZA PANEL BY PCR (TYPE A & B)
H1N1 flu by pcr: NOT DETECTED
INFLBPCR: NEGATIVE
Influenza A By PCR: NEGATIVE

## 2014-04-23 LAB — CK: Total CK: 67 U/L (ref 7–177)

## 2014-04-23 LAB — URIC ACID: URIC ACID, SERUM: 7.6 mg/dL — AB (ref 2.4–7.0)

## 2014-04-23 LAB — I-STAT CG4 LACTIC ACID, ED: LACTIC ACID, VENOUS: 1.4 mmol/L (ref 0.5–2.2)

## 2014-04-23 MED ORDER — AMLODIPINE BESYLATE 5 MG PO TABS
5.0000 mg | ORAL_TABLET | Freq: Every day | ORAL | Status: DC
  Administered 2014-04-23 – 2014-04-27 (×5): 5 mg via ORAL
  Filled 2014-04-23 (×5): qty 1

## 2014-04-23 MED ORDER — DONEPEZIL HCL 10 MG PO TABS
10.0000 mg | ORAL_TABLET | Freq: Every day | ORAL | Status: DC
  Administered 2014-04-23 – 2014-04-26 (×4): 10 mg via ORAL
  Filled 2014-04-23 (×5): qty 1

## 2014-04-23 MED ORDER — LEVOTHYROXINE SODIUM 75 MCG PO TABS
37.5000 ug | ORAL_TABLET | Freq: Every day | ORAL | Status: DC
  Administered 2014-04-24 – 2014-04-27 (×4): 37.5 ug via ORAL
  Filled 2014-04-23 (×3): qty 1
  Filled 2014-04-23: qty 0.5

## 2014-04-23 MED ORDER — ACETAMINOPHEN 325 MG PO TABS: 650.0000 mg | ORAL_TABLET | Freq: Four times a day (QID) | ORAL | Status: DC | PRN

## 2014-04-23 MED ORDER — ACETAMINOPHEN 325 MG PO TABS
650.0000 mg | ORAL_TABLET | Freq: Once | ORAL | Status: AC
  Administered 2014-04-23: 650 mg via ORAL
  Filled 2014-04-23: qty 2

## 2014-04-23 MED ORDER — HYDROCODONE-ACETAMINOPHEN 5-325 MG PO TABS
1.0000 | ORAL_TABLET | ORAL | Status: DC | PRN
  Administered 2014-04-23 – 2014-04-27 (×7): 1 via ORAL
  Filled 2014-04-23 (×7): qty 1

## 2014-04-23 MED ORDER — SENNA 8.6 MG PO TABS
1.0000 | ORAL_TABLET | Freq: Two times a day (BID) | ORAL | Status: DC
  Administered 2014-04-23 – 2014-04-27 (×9): 8.6 mg via ORAL
  Filled 2014-04-23 (×9): qty 1

## 2014-04-23 MED ORDER — ONDANSETRON HCL 4 MG PO TABS: 4.0000 mg | ORAL_TABLET | Freq: Four times a day (QID) | ORAL | Status: DC | PRN

## 2014-04-23 MED ORDER — ACETAMINOPHEN 650 MG RE SUPP: 650.0000 mg | Freq: Four times a day (QID) | RECTAL | Status: DC | PRN

## 2014-04-23 MED ORDER — MOMETASONE FURO-FORMOTEROL FUM 100-5 MCG/ACT IN AERO
2.0000 | INHALATION_SPRAY | Freq: Two times a day (BID) | RESPIRATORY_TRACT | Status: DC
  Administered 2014-04-23 – 2014-04-27 (×8): 2 via RESPIRATORY_TRACT
  Filled 2014-04-23: qty 8.8

## 2014-04-23 MED ORDER — POLYETHYLENE GLYCOL 3350 17 G PO PACK
17.0000 g | PACK | Freq: Every day | ORAL | Status: DC | PRN
Start: 2014-04-23 — End: 2014-04-27
  Filled 2014-04-23: qty 1

## 2014-04-23 MED ORDER — ONDANSETRON HCL 4 MG/2ML IJ SOLN
4.0000 mg | Freq: Four times a day (QID) | INTRAMUSCULAR | Status: DC | PRN
Start: 2014-04-23 — End: 2014-04-27

## 2014-04-23 MED ORDER — ALUM & MAG HYDROXIDE-SIMETH 200-200-20 MG/5ML PO SUSP: 30.0000 mL | Freq: Four times a day (QID) | ORAL | Status: DC | PRN

## 2014-04-23 MED ORDER — DOCUSATE SODIUM 100 MG PO CAPS
100.0000 mg | ORAL_CAPSULE | Freq: Two times a day (BID) | ORAL | Status: DC
  Administered 2014-04-23 – 2014-04-27 (×9): 100 mg via ORAL
  Filled 2014-04-23 (×10): qty 1

## 2014-04-23 MED ORDER — HYDRALAZINE HCL 20 MG/ML IJ SOLN
10.0000 mg | INTRAMUSCULAR | Status: DC | PRN
  Administered 2014-04-24 – 2014-04-25 (×3): 10 mg via INTRAVENOUS
  Filled 2014-04-23 (×3): qty 1

## 2014-04-23 MED ORDER — VITAMIN D3 25 MCG (1000 UNIT) PO TABS: 1000.0000 [IU] | ORAL_TABLET | ORAL | Status: DC

## 2014-04-23 MED ORDER — METOPROLOL TARTRATE 25 MG PO TABS
50.0000 mg | ORAL_TABLET | Freq: Two times a day (BID) | ORAL | Status: DC
  Administered 2014-04-23 – 2014-04-25 (×6): 50 mg via ORAL
  Filled 2014-04-23 (×6): qty 2

## 2014-04-23 NOTE — Progress Notes (Signed)
Pt's daughter is currently at bedside and states that the pt last took Xerelto at 1600 on 04/22/2014

## 2014-04-23 NOTE — ED Notes (Signed)
Pt unable to ambulate. PA aware.

## 2014-04-23 NOTE — ED Notes (Signed)
Pt comes from home. Per EMS pt not feeling well body aches and chills x1 day. Pt states she is unable to get comfortable. Pt is alert and oriented per baseline. Pt has hx of Alzheimer.

## 2014-04-23 NOTE — Consult Note (Signed)
Name: Caroline Underwood MRN: [id number] DOB: 1922/06/22    ADMISSION DATE:  04/23/2014 CONSULTATION DATE:  04/23/14  REFERRING MD :  Dr. Sheran Fava   CHIEF COMPLAINT:  Pneumothorax   BRIEF PATIENT DESCRIPTION: 80 y/o F admitted per [facility] for lower abd/hip pain.  CT Abd noted an incidental finding large L pneumothorax.  PCCM consulted for evaluation of PTX.    SIGNIFICANT EVENTS  10/7  Admit for hip pain, plain films neg for hip fx, DJD.  CT abd performed to r/o RP bleed and incidental finding of L PTX  STUDIES:  10/7  CXR > L PTX, emphysema  10/7  Pelvis XR >> irregular L femoral head/neck, ? DJD vs subtle acute non-displaced subcapital fx 10/7  L hip complete XR > degenerative changes, no acute fx   HISTORY OF PRESENT ILLNESS:  80 y/o F, former smoker with PMH of PAD, A-Flutter on Xarelto, HTN, CKD, Breast CA (2014), COPD, Dementia (unclear how advanced, has been on Namenda in past, Hypothyroidism, and AAA repair who presented to [facility] ER on 10/7 via EMS with a one day history of decreased mobility (walks with a walker at baseline) and lower abd/hip pain.  Pt describes pain as an ache "where my legs meet my body".  She lives with her daughter, [name] and has been essentially bed bound the last few days prior to admit.  TRH wer called to admit patient.  Patient had no other acute complaints and was in no distress on exam per Dr. Sheran Fava.  Out of concern for possible retroperitoneal bleed with anticoagulation and exam findings of pain with flexion, a CT of the abdomen was obtained which was negative for abdominal findings but demonstrated a large left pneumothorax.  PCCM consulted for evaluation.   PAST MEDICAL HISTORY :   has a past medical history of Status post AAA (abdominal aortic aneurysm) repair (01/02/2013); PAD (peripheral artery disease) (01/02/2013); Osteoporosis, unspecified (01/02/2013); Hyperlipemia (01/02/2013); Hx of atrial flutter, on Xarelto (01/02/2013); Essential hypertension, benign  (01/02/2013); Chronic kidney disease (01/02/2013); Breast cancer (01/02/2013); COPD (chronic obstructive pulmonary disease) (01/02/2013); Dementia, on Namenda briefly in the past per review of records (01/02/2013); GERD (gastroesophageal reflux disease) (01/02/2013); Iron deficiency anemia (01/02/2013); and Unspecified hypothyroidism (01/02/2013).  has past surgical history that includes Abdominal aortic aneurysm repair and Breast surgery. Prior to Admission medications   Medication Sig Start Date End Date Taking? Authorizing Provider  amLODipine (NORVASC) 5 MG tablet Take 1 tablet (5 mg total) by mouth daily. 03/18/14  Yes Lucretia Kern, DO  atorvastatin (LIPITOR) 20 MG tablet Take 1 tablet (20 mg total) by mouth daily at 6 PM. 02/07/14  Yes Lucretia Kern, DO  cholecalciferol (VITAMIN D) 1000 UNITS tablet Take 1,000 Units by mouth once a week. On tuesday   Yes Historical Provider, MD  donepezil (ARICEPT) 10 MG tablet Take one tablet (10 mg) by mouth at bedtime. 02/07/14  Yes Adam Melvern Sample, DO  Fluticasone-Salmeterol (ADVAIR) 100-50 MCG/DOSE AEPB Inhale 1 puff into the lungs every 12 (twelve) hours.   Yes Historical Provider, MD  furosemide (LASIX) 40 MG tablet Take 40 mg by mouth daily.   Yes Historical Provider, MD  levothyroxine (LEVOTHROID) 25 MCG tablet Take 1.5 tablets (37.5 mcg total) by mouth daily before breakfast. 11/13/13  Yes Lucretia Kern, DO  Melatonin 3 MG TABS Take 3 mg by mouth at bedtime.   Yes Historical Provider, MD  metoprolol (LOPRESSOR) 50 MG tablet Take 50 mg by mouth 2 (two) times  daily.   Yes Historical Provider, MD  Rivaroxaban (XARELTO) 15 MG TABS tablet Take 1 tablet (15 mg total) by mouth daily. 02/25/13  Yes Thayer Headings, MD  vitamin C (ASCORBIC ACID) 500 MG tablet Take 500 mg by mouth daily.   Yes Historical Provider, MD   No Known Allergies  FAMILY HISTORY:  family history includes Cancer in her mother; Heart disease (age of onset: 65) in her father.  SOCIAL HISTORY:   reports that she quit smoking about 6 years ago. Her smoking use included Cigarettes. She smoked 0.50 packs per day. She has never used smokeless tobacco. She reports that she does not drink alcohol or use illicit drugs.  REVIEW OF SYSTEMS:   Constitutional: Negative for fever, chills, weight loss, malaise/fatigue and diaphoresis.  HENT: Negative for hearing loss, ear pain, nosebleeds, congestion, sore throat, neck pain, tinnitus and ear discharge.   Eyes: Negative for blurred vision, double vision, photophobia, pain, discharge and redness.  Respiratory: Negative for cough, hemoptysis, sputum production, shortness of breath, wheezing and stridor.   Cardiovascular: Negative for chest pain, palpitations, orthopnea, claudication, leg swelling and PND.  Gastrointestinal: Negative for heartburn, nausea, vomiting, diarrhea, constipation, blood in stool and melena. + lower abd pain / hip pain  Genitourinary: Negative for dysuria, urgency, frequency, hematuria and flank pain.  Musculoskeletal: Negative for myalgias, back pain, joint pain and falls. + hip pain  Skin: Negative for itching and rash.  Neurological: Negative for dizziness, tingling, tremors, sensory change, speech change, focal weakness, seizures, loss of consciousness, weakness and headaches.  Endo/Heme/Allergies: Negative for environmental allergies and polydipsia. Does not bruise/bleed easily.  SUBJECTIVE:   VITAL SIGNS: Temp:  [97.8 F (36.6 C)-98.9 F (37.2 C)] 98.5 F (36.9 C) (10/07 1018) Pulse Rate:  [62-88] 73 (10/07 1018) Resp:  [16-21] 21 (10/07 1018) BP: (156-169)/(57-73) 156/72 mmHg (10/07 1018) SpO2:  [92 %-99 %] 99 % (10/07 1018)  PHYSICAL EXAMINATION: General:  wdwn elderly female in NAD  Neuro:  Awake / alert, appropriate in conversation, MAE HEENT:  Mm pin/moist, poor dentition  Cardiovascular:  s1s2 rrr, no m/r/g  Lungs:  resp's even/non-labored, diminished on L, clear on R  Abdomen:  Round/soft, bsx4  active Musculoskeletal:  No acute deformities  Skin:  Warm/dry, no edema    Recent Labs Lab 04/23/14 0219  NA 139  K 3.8  CL 99  CO2 25  BUN 24*  CREATININE 1.50*  GLUCOSE 163*    Recent Labs Lab 04/23/14 0219  HGB 13.3  HCT 37.7  WBC 18.0*  PLT 176   Ct Abdomen Pelvis Wo Contrast  04/23/2014   CLINICAL DATA:  Evaluate for retroperitoneal hemorrhage ; bilateral groin aching also chills ; history of previous abdominal aortic aneurysm repair, COPD, common chronic renal insufficiency  EXAM: CT ABDOMEN AND PELVIS WITHOUT CONTRAST  TECHNIQUE: Multidetector CT imaging of the abdomen and pelvis was performed following the standard protocol without IV contrast.  COMPARISON:  PA and lateral chest x-ray of April 23, 2014 and pelvis series of the same day  FINDINGS: There is a large left-sided pneumothorax. There is a small amount of pleural fluid layering posteriorly on the left.  There is an aorto bi-iliac stent graft in place. The maximal AP diameter of the stented abdominal aorta is 3.9 cm which is stable. There is no periaortic hemorrhage. The peri-iliac soft tissues are unremarkable. The psoas musculature is normal. There is no intraperitoneal nor retroperitoneal hemorrhage.  The liver, gallbladder, pancreas, spleen, adrenal glands, and  kidneys exhibit no acute abnormalities. There is chronic right renal atrophy. There is a stable 5.5 cm mid to lower pole left renal cyst r. The small and large bowel exhibit no acute abnormalities. There is diverticulosis. The appendix is normal. The urinary bladder is normal. The uterus is small appropriate for age. There are no adnexal masses. There is no inguinal nor umbilical hernia.  IMPRESSION: 1. There is a large left-sided pneumothorax with small left pleural effusion. 2. The aorto bi-iliac stent graft and surrounding soft tissues are unremarkable. 3. There is no intraperitoneal nor retroperitoneal hemorrhage 4. There is chronic right renal atrophy. 5.  Critical Value/emergent results were called by telephone at the time of interpretation on 04/23/2014 at 9:22 am to Dr. Dina Rich, who verbally acknowledged these results.   Electronically Signed   By: David  Martinique   On: 04/23/2014 09:38   Dg Chest 2 View  04/23/2014   CLINICAL DATA:  Initial encounter for body aches and chills. Fever. Next item  EXAM: CHEST  2 VIEW  COMPARISON:  One-view chest 02/16/2013.  FINDINGS: The heart size is normal. Emphysema is again noted. No focal airspace disease is evident. Atherosclerotic changes are noted at the aortic arch. The visualized soft tissues and bony thorax are unremarkable.  IMPRESSION: 1. No acute cardiopulmonary disease or significant interval change. 2. Emphysema. 3. Atherosclerosis.   Electronically Signed   By: Lawrence Santiago M.D.   On: 04/23/2014 02:23   Dg Pelvis 1-2 Views  04/23/2014   CLINICAL DATA:  Pelvic pain.  No known trauma.  EXAM: PELVIS - 1-2 VIEW  COMPARISON:  None.  FINDINGS: Degenerative osteoarthrosis noted about both hips. There is mild deformity about the left femoral head and neck, which appears new relative to most recent CT from 02/15/2013. While this finding may be related to chronic degenerative changes, a possible acute subtle subcapital fracture of the left femoral head/neck is not entirely excluded.  Right hip is grossly intact. Bony pelvis is intact. SI joints are approximated. No pubic diastasis.  No soft tissue abnormality.  IMPRESSION: 1. Irregularity about the left femoral head and neck. While these findings are likely related to chronic underlying degenerative changes, a possible subtle acute nondisplaced subcapital fracture of the left femoral neck is not entirely excluded. Correlation with physical exam recommended. Additionally, dedicated radiographs of the left hip may be helpful for further evaluation. 2. No other acute abnormality within the pelvis.   Electronically Signed   By: Jeannine Boga M.D.   On: 04/23/2014 06:21    Dg Hip Complete Left  04/23/2014   CLINICAL DATA:  Atraumatic left groin pain ; history of dementia; limited history available  EXAM: LEFT HIP - COMPLETE 2+ VIEW  COMPARISON:  AP pelvis of April 23, 2014  FINDINGS: The bony pelvis is adequately mineralized for age. There is no acute fracture. There is degenerative change of the hip joints greater on the left than on the right. No acute hip fracture or dislocation is demonstrated. The overlying soft tissues are unremarkable. There is no evidence of gas within a left inguinal hernia. There is an aorto bi-iliac stent graft in place.  IMPRESSION: There are degenerative changes of the left hip but there is no acute fracture nor dislocation. No acute abnormality elsewhere is demonstrated.   Electronically Signed   By: David  Martinique   On: 04/23/2014 08:36    ASSESSMENT / PLAN:  Left Pneumothorax - appears to be a spontaneous pneumothorax, no acute pulmonary symptoms Atypical  Pleural Thickening & Nodularity  Emphysema  Former Tobacco Abuse   Plan: Hold placement of left Wayne catheter given history of a-flutter on Xarelto PCXR in 4 hours, then in am Hopeful for conservative management as pt is in no distress and anticoagulation complicates placement Oxygen to support sats 90-94%   AFlutter - on Xarelto    Left message for family @ [phone].   PCCM will follow along with you.  Thank you for the consultation.   Noe Gens, NP-C [facility] Pgr: [phone] or [phone]   Attending:  I have seen and examined the patient with nurse practitioner/resident and agree with the note above.   Given Xarelto and lack of symptoms right now will cautiously observe in ICU rather than place chest tube given bleeding risk Monitor serial CXR Hold xarelto, may need Chest tube 10/8, sooner if symptomatic  Roselie Awkward, MD [facility] Pager: [phone] Cell: [phone] If no response, call [phone]   04/23/2014, 11:05  AM

## 2014-04-23 NOTE — H&P (Addendum)
[facility] History and Physical  Caroline Underwood VZD:[id number] DOB: 1921/08/09 DOA: 04/23/2014  Referring physician:  Lurene Shadow PCP:  Lucretia Kern., DO   Chief Complaint:  Chills, body aches  HPI:  The patient is a 80 y.o. year-old female with history of PAD, atrial flutter on a/c, CKD stage 3/4, COPD, hypothyroidism, and mild dementia who presents with muscle aches and inability to walk.  The patient was last at their baseline health a few days prior to admission.  Per the patient, normally she lives with her son and daughter and ambulates with a walker.  She has been unable to get around even with her walker for the last few days.  She states she has been bedbound for the last few days and her family has been assisting with caring for her.  She denies sinus congestion, sore throat, cough, SOB, dysuria.  She has severe groin pain with movement of either of her legs, but left leg movement is worse than right leg.  She denies recent falls or trauma.  Per ER notes, she had some chills and subjective fevers this morning before presenting to the ER.    In the ER, VSS except for mild hypertension to 169/68.  Labs notable for WBC 18.  Creatinine at baseline of 1.5.  CK 67.  Lactic acid 1.4.  CXR without acute disease but demonstrated emphysema c/w previous COPD diagnosis.  UA with proteinua, 11-20 RBC, rare WBC and rare bacteria.  Negative LE and nitrite.  XR of the pelvis due to pelvis pain demonstrated irregularily of the left femoral head and neck with possible subtle nondisplaced subcapital fracture of the left femoral neck.    Review of Systems:  General:  Subjective fevers, chills HEENT:  Denies changes to hearing and vision, rhinorrhea, sinus congestion, sore throat CV:  Denies chest pain and palpitations, lower extremity edema.  PULM:  Denies SOB, wheezing, cough.   GI:  Denies nausea, vomiting, constipation, diarrhea.   GU:  Denies dysuria, frequency, urgency ENDO:  Denies  polyuria, polydipsia.   HEME:  Denies hematemesis, blood in stools, melena, abnormal bruising or bleeding.  LYMPH:  Denies lymphadenopathy.   MSK:  New arthralgias, myalgias of bilateral quadriceps and groin  DERM:  Denies skin rash or ulcer.   NEURO:  Denies focal numbness, weakness, slurred speech, confusion, facial droop.  Endorses memory problems PSYCH:  Denies anxiety and depression.    Past Medical History  Diagnosis Date  . Status post AAA (abdominal aortic aneurysm) repair 01/02/2013  . PAD (peripheral artery disease) 01/02/2013  . Osteoporosis, unspecified 01/02/2013  . Hyperlipemia 01/02/2013  . Hx of atrial flutter, on Xarelto 01/02/2013  . Essential hypertension, benign 01/02/2013  . Chronic kidney disease 01/02/2013  . Breast cancer 01/02/2013  . COPD (chronic obstructive pulmonary disease) 01/02/2013  . Dementia, on Namenda briefly in the past per review of records 01/02/2013  . GERD (gastroesophageal reflux disease) 01/02/2013  . Iron deficiency anemia 01/02/2013  . Unspecified hypothyroidism 01/02/2013   Past Surgical History  Procedure Laterality Date  . Abdominal aortic aneurysm repair    . Breast surgery      right lumpectomy   Social History:  reports that she quit smoking about 6 years ago. She has never used smokeless tobacco. She reports that she does not drink alcohol or use illicit drugs. Uses walker in home and lives with her daughter [name] and son vs. Son-in-law.  Needs some assistance with ADLS.  Family manages her medications.  No Known Allergies  Family History  Problem Relation Age of Onset  . Cancer Mother     lung  . Heart disease Father 71     Prior to Admission medications   Medication Sig Start Date End Date Taking? Authorizing Provider  amLODipine (NORVASC) 5 MG tablet Take 1 tablet (5 mg total) by mouth daily. 03/18/14  Yes Lucretia Kern, DO  atorvastatin (LIPITOR) 20 MG tablet Take 1 tablet (20 mg total) by mouth daily at 6 PM. 02/07/14  Yes Lucretia Kern, DO  cholecalciferol (VITAMIN D) 1000 UNITS tablet Take 1,000 Units by mouth once a week. On tuesday   Yes Historical Provider, MD  donepezil (ARICEPT) 10 MG tablet Take one tablet (10 mg) by mouth at bedtime. 02/07/14  Yes Adam Melvern Sample, DO  Fluticasone-Salmeterol (ADVAIR DISKUS) 100-50 MCG/DOSE AEPB INHALE 1 PUFF INTO THE LUNGS EVERY 12 (TWELVE) HOURS. 11/19/13  Yes Lucretia Kern, DO  furosemide (LASIX) 40 MG tablet TAKE 1 TABLET BY MOUTH EVERY DAY   Yes Lucretia Kern, DO  levothyroxine (LEVOTHROID) 25 MCG tablet Take 1.5 tablets (37.5 mcg total) by mouth daily before breakfast. 11/13/13  Yes Lucretia Kern, DO  Melatonin 3 MG TABS Take 3 mg by mouth at bedtime.   Yes Historical Provider, MD  metoprolol (LOPRESSOR) 50 MG tablet Take 0.5 twice a day   Yes Historical Provider, MD  Rivaroxaban (XARELTO) 15 MG TABS tablet Take 1 tablet (15 mg total) by mouth daily. 02/25/13  Yes Thayer Headings, MD  vitamin C (ASCORBIC ACID) 500 MG tablet Take 500 mg by mouth daily.   Yes Historical Provider, MD   Physical Exam: Filed Vitals:   04/23/14 0129 04/23/14 0247 04/23/14 0636  BP: 169/68 165/73 163/57  Pulse: 71 88 62  Temp: 98.8 F (37.1 C) 98.9 F (37.2 C) 97.8 F (36.6 C)  TempSrc: Oral Oral Oral  Resp: 20 20 16   SpO2: 93% 92% 92%     General:  WF, NAD  Eyes:  PERRL, anicteric, non-injected.  ENT:  Nares clear.  OP clear, non-erythematous without plaques or exudates.  MMM.  Neck:  Supple without TM or JVD.    Lymph:  No cervical, supraclavicular, or submandibular LAD.  Cardiovascular:  RRR, normal S1, S2, without m/r/g.  2+ pulses, warm extremities  Respiratory:  CTA bilaterally without increased WOB.  Abdomen:  NABS.  Soft, ND, mild TTP in the epigastrium and also in the groin area   Skin:  No rashes or focal lesions.  Musculoskeletal:  Normal bulk and tone.  No LE edema.  Psychiatric:  A & O to person, place, and Oct 5th or 6ht (currently the 7th).  Appropriate  affect.  Neurologic:  CN 3-12 intact.  5/5 strength upper extremities, ankles and knees bilaterally.  Unable to flex hips due to pain in groin.   Sensation intact.    Labs on Admission:   Basic Metabolic Panel:  Recent Labs Lab 04/23/14 0219  NA 139  K 3.8  CL 99  CO2 25  GLUCOSE 163*  BUN 24*  CREATININE 1.50*  CALCIUM 9.3   Liver Function Tests: No results found for this basename: AST, ALT, ALKPHOS, BILITOT, PROT, ALBUMIN,  in the last 168 hours No results found for this basename: LIPASE, AMYLASE,  in the last 168 hours No results found for this basename: AMMONIA,  in the last 168 hours CBC:  Recent Labs Lab 04/23/14 0219  WBC 18.0*  NEUTROABS 15.4*  HGB 13.3  HCT 37.7  MCV 90.2  PLT 176   Cardiac Enzymes:  Recent Labs Lab 04/23/14 0613  CKTOTAL 67    BNP (last 3 results) No results found for this basename: PROBNP,  in the last 8760 hours CBG: No results found for this basename: GLUCAP,  in the last 168 hours  Radiological Exams on Admission: Dg Chest 2 View  04/23/2014   CLINICAL DATA:  Initial encounter for body aches and chills. Fever. Next item  EXAM: CHEST  2 VIEW  COMPARISON:  One-view chest 02/16/2013.  FINDINGS: The heart size is normal. Emphysema is again noted. No focal airspace disease is evident. Atherosclerotic changes are noted at the aortic arch. The visualized soft tissues and bony thorax are unremarkable.  IMPRESSION: 1. No acute cardiopulmonary disease or significant interval change. 2. Emphysema. 3. Atherosclerosis.   Electronically Signed   By: Lawrence Santiago M.D.   On: 04/23/2014 02:23   Dg Pelvis 1-2 Views  04/23/2014   CLINICAL DATA:  Pelvic pain.  No known trauma.  EXAM: PELVIS - 1-2 VIEW  COMPARISON:  None.  FINDINGS: Degenerative osteoarthrosis noted about both hips. There is mild deformity about the left femoral head and neck, which appears new relative to most recent CT from 02/15/2013. While this finding may be related to chronic  degenerative changes, a possible acute subtle subcapital fracture of the left femoral head/neck is not entirely excluded.  Right hip is grossly intact. Bony pelvis is intact. SI joints are approximated. No pubic diastasis.  No soft tissue abnormality.  IMPRESSION: 1. Irregularity about the left femoral head and neck. While these findings are likely related to chronic underlying degenerative changes, a possible subtle acute nondisplaced subcapital fracture of the left femoral neck is not entirely excluded. Correlation with physical exam recommended. Additionally, dedicated radiographs of the left hip may be helpful for further evaluation. 2. No other acute abnormality within the pelvis.   Electronically Signed   By: Jeannine Boga M.D.   On: 04/23/2014 06:21    EKG: pending  Assessment/Plan Active Problems:   * No active hospital problems. *  ---  Moderate to large left pneumothorax, currently asymptomatic -  PCCM consultation for possible chest tube, appreciate assistance  Leukocytosis -  Afebrile -  CXR and UA unremarkable -  Hold abx for now as she does not meet criteria for SIRS/sepsis  Pelvic pain with possible subcapital hip fracture -  XR left hip to determine if there is fracture is pending  -  CT abd/pelvis without contrast to eval for retroperitoneal bleed -  PT/OT -  Tylenol and hydrocodone prn pain  COPD, stable.  Continue LABA/ICS  Atrial flutter -  ECG pending -  Hold a/c for now  -  Continue metoprolol  HTN, blood pressure mildly elevated -  Continue norvasc  HLD, stable and CK wnl.  -  Await LFTs -  Hold statin but may resume if LFTs are wnl  CKD stage 3/4 -  Renally dose medications -  Minimize nephrotoxins  -  Hold lasix today and readdress volume status tomorrow  Hypothyroidism, stable, monitored by PCP last month.  Continue levothyroxine  Dementia, stable, on aricept  Diet:  regular Access:  PIV IVF:  Off  Proph:  SCDs pending further eval  for hip pain  Code Status: DNR - patient states she has not discussed yet with family Family Communication: patient alone Disposition Plan: Admit to med-surg  Time spent: 60 min Myer Bohlman, [place]  [facility] Pager [phone]  If 7PM-7AM, please contact night-coverage [email or web address] Password [email or web address] 04/23/2014, 7:48 AM

## 2014-04-23 NOTE — ED Provider Notes (Signed)
CSN: [id number]     Arrival date & time 04/23/14  0130 History   First MD Initiated Contact with Patient 04/23/14 0146     Chief Complaint  Patient presents with  . Chills  . Generalized Body Aches     (Consider location/radiation/quality/duration/timing/severity/associated sxs/prior Treatment) HPI Comments: Patient is a 80 year old female with past medical history of PAD, atrial flutter, CKD, COPD, hypothyroidism, and dementia who presents from home with family with a 2 day history of chills and myalgias. Symptoms started gradually and remained constant since the onset. Patient got up in the middle of the night to go to the bathroom when she felt like her whole body was aching and was moving slow, per patient's son. This is not typical for her. She denies any other associated symptoms such as abdominal pain, cough, fever, nausea, vomiting, diarrhea. Patient has not tried anything for symptoms at home. No aggravating/alleviating factors.    Past Medical History  Diagnosis Date  . Status post AAA (abdominal aortic aneurysm) repair 01/02/2013  . PAD (peripheral artery disease) 01/02/2013  . Osteoporosis, unspecified 01/02/2013  . Hyperlipemia 01/02/2013  . Hx of atrial flutter, on Xarelto 01/02/2013  . Essential hypertension, benign 01/02/2013  . Chronic kidney disease 01/02/2013  . Breast cancer 01/02/2013  . COPD (chronic obstructive pulmonary disease) 01/02/2013  . Dementia, on Namenda briefly in the past per review of records 01/02/2013  . GERD (gastroesophageal reflux disease) 01/02/2013  . Iron deficiency anemia 01/02/2013  . Unspecified hypothyroidism 01/02/2013   Past Surgical History  Procedure Laterality Date  . Abdominal aortic aneurysm repair    . Breast surgery      right lumpectomy   Family History  Problem Relation Age of Onset  . Cancer Mother     lung  . Heart disease Father 60   History  Substance Use Topics  . Smoking status: Former Smoker    Quit date: 02/16/2008  .  Smokeless tobacco: Never Used  . Alcohol Use: No   OB History   Grav Para Term Preterm Abortions TAB SAB Ect Mult Living                 Review of Systems  Constitutional: Positive for chills. Negative for fever and fatigue.  HENT: Negative for trouble swallowing.   Eyes: Negative for visual disturbance.  Respiratory: Negative for shortness of breath.   Cardiovascular: Negative for chest pain and palpitations.  Gastrointestinal: Negative for nausea, vomiting, abdominal pain and diarrhea.  Genitourinary: Negative for dysuria and difficulty urinating.  Musculoskeletal: Positive for myalgias. Negative for arthralgias and neck pain.  Skin: Negative for color change.  Neurological: Negative for dizziness and weakness.  Psychiatric/Behavioral: Negative for dysphoric mood.      Allergies  Review of patient's allergies indicates no known allergies.  Home Medications   Prior to Admission medications   Medication Sig Start Date End Date Taking? Authorizing Provider  amLODipine (NORVASC) 5 MG tablet Take 1 tablet (5 mg total) by mouth daily. 03/18/14  Yes Lucretia Kern, DO  atorvastatin (LIPITOR) 20 MG tablet Take 1 tablet (20 mg total) by mouth daily at 6 PM. 02/07/14  Yes Lucretia Kern, DO  cholecalciferol (VITAMIN D) 1000 UNITS tablet Take 1,000 Units by mouth once a week. On tuesday   Yes Historical Provider, MD  donepezil (ARICEPT) 10 MG tablet Take one tablet (10 mg) by mouth at bedtime. 02/07/14  Yes Adam Melvern Sample, DO  Fluticasone-Salmeterol (ADVAIR DISKUS) 100-50 MCG/DOSE AEPB  INHALE 1 PUFF INTO THE LUNGS EVERY 12 (TWELVE) HOURS. 11/19/13  Yes Lucretia Kern, DO  furosemide (LASIX) 40 MG tablet TAKE 1 TABLET BY MOUTH EVERY DAY   Yes Lucretia Kern, DO  levothyroxine (LEVOTHROID) 25 MCG tablet Take 1.5 tablets (37.5 mcg total) by mouth daily before breakfast. 11/13/13  Yes Lucretia Kern, DO  Melatonin 3 MG TABS Take 3 mg by mouth at bedtime.   Yes Historical Provider, MD  metoprolol  (LOPRESSOR) 50 MG tablet Take 0.5 twice a day   Yes Historical Provider, MD  Rivaroxaban (XARELTO) 15 MG TABS tablet Take 1 tablet (15 mg total) by mouth daily. 02/25/13  Yes Thayer Headings, MD  vitamin C (ASCORBIC ACID) 500 MG tablet Take 500 mg by mouth daily.   Yes Historical Provider, MD   BP 169/68  Pulse 71  Temp(Src) 98.8 F (37.1 C) (Oral)  Resp 20  SpO2 93% Physical Exam  Nursing note and vitals reviewed. Constitutional: She is oriented to person, place, and time. She appears well-developed and well-nourished. No distress.  HENT:  Head: Normocephalic and atraumatic.  Eyes: Conjunctivae and EOM are normal. Pupils are equal, round, and reactive to light.  Neck: Normal range of motion.  Cardiovascular: Normal rate and regular rhythm.  Exam reveals no gallop and no friction rub.   No murmur heard. Pulmonary/Chest: Effort normal and breath sounds normal. She has no wheezes. She has no rales. She exhibits no tenderness.  Abdominal: Soft. She exhibits no distension. There is no tenderness. There is no rebound and no guarding.  Musculoskeletal: Normal range of motion.  Neurological: She is alert and oriented to person, place, and time. Coordination normal.  Speech is goal-oriented. Moves limbs without ataxia.   Skin: Skin is warm and dry.  Psychiatric: She has a normal mood and affect. Her behavior is normal.    ED Course  Procedures (including critical care time) Labs Review Labs Reviewed  CBC WITH DIFFERENTIAL - Abnormal; Notable for the following:    WBC 18.0 (*)    Neutrophils Relative % 85 (*)    Neutro Abs 15.4 (*)    Lymphocytes Relative 9 (*)    All other components within normal limits  BASIC METABOLIC PANEL - Abnormal; Notable for the following:    Glucose, Bld 163 (*)    BUN 24 (*)    Creatinine, Ser 1.50 (*)    GFR calc non Af Amer 29 (*)    GFR calc Af Amer 34 (*)    All other components within normal limits  URINALYSIS, ROUTINE W REFLEX MICROSCOPIC -  Abnormal; Notable for the following:    Hgb urine dipstick SMALL (*)    Protein, ur >300 (*)    All other components within normal limits  URIC ACID - Abnormal; Notable for the following:    Uric Acid, Serum 7.6 (*)    All other components within normal limits  MRSA PCR SCREENING  URINE CULTURE  CULTURE, BLOOD (ROUTINE X 2)  CULTURE, BLOOD (ROUTINE X 2)  INFLUENZA PANEL BY PCR (TYPE A & B, H1N1)  URINE MICROSCOPIC-ADD ON  SEDIMENTATION RATE  CK  HEPATIC FUNCTION PANEL  LIPASE, BLOOD  TROPONIN I  TROPONIN I  TROPONIN I  COMPREHENSIVE METABOLIC PANEL  CBC  I-STAT CG4 LACTIC ACID, ED    Imaging Review Ct Abdomen Pelvis Wo Contrast  04/23/2014   CLINICAL DATA:  Evaluate for retroperitoneal hemorrhage ; bilateral groin aching also chills ; history of previous abdominal  aortic aneurysm repair, COPD, common chronic renal insufficiency  EXAM: CT ABDOMEN AND PELVIS WITHOUT CONTRAST  TECHNIQUE: Multidetector CT imaging of the abdomen and pelvis was performed following the standard protocol without IV contrast.  COMPARISON:  PA and lateral chest x-ray of April 23, 2014 and pelvis series of the same day  FINDINGS: There is a large left-sided pneumothorax. There is a small amount of pleural fluid layering posteriorly on the left.  There is an aorto bi-iliac stent graft in place. The maximal AP diameter of the stented abdominal aorta is 3.9 cm which is stable. There is no periaortic hemorrhage. The peri-iliac soft tissues are unremarkable. The psoas musculature is normal. There is no intraperitoneal nor retroperitoneal hemorrhage.  The liver, gallbladder, pancreas, spleen, adrenal glands, and kidneys exhibit no acute abnormalities. There is chronic right renal atrophy. There is a stable 5.5 cm mid to lower pole left renal cyst r. The small and large bowel exhibit no acute abnormalities. There is diverticulosis. The appendix is normal. The urinary bladder is normal. The uterus is small appropriate for  age. There are no adnexal masses. There is no inguinal nor umbilical hernia.  IMPRESSION: 1. There is a large left-sided pneumothorax with small left pleural effusion. 2. The aorto bi-iliac stent graft and surrounding soft tissues are unremarkable. 3. There is no intraperitoneal nor retroperitoneal hemorrhage 4. There is chronic right renal atrophy. 5. Critical Value/emergent results were called by telephone at the time of interpretation on 04/23/2014 at 9:22 am to Dr. Dina Rich, who verbally acknowledged these results.   Electronically Signed   By: David  Martinique   On: 04/23/2014 09:38   Dg Chest 2 View  04/23/2014   ADDENDUM REPORT: 04/23/2014 18:04  ADDENDUM: A left-sided pneumothorax is present. The clinical team was made aware.   Electronically Signed   By: Lawrence Santiago M.D.   On: 04/23/2014 18:04   04/23/2014   CLINICAL DATA:  Initial encounter for body aches and chills. Fever. Next item  EXAM: CHEST  2 VIEW  COMPARISON:  One-view chest 02/16/2013.  FINDINGS: The heart size is normal. Emphysema is again noted. No focal airspace disease is evident. Atherosclerotic changes are noted at the aortic arch. The visualized soft tissues and bony thorax are unremarkable.  IMPRESSION: 1. No acute cardiopulmonary disease or significant interval change. 2. Emphysema. 3. Atherosclerosis.  Electronically Signed: By: Lawrence Santiago M.D. On: 04/23/2014 02:23   Dg Pelvis 1-2 Views  04/23/2014   CLINICAL DATA:  Pelvic pain.  No known trauma.  EXAM: PELVIS - 1-2 VIEW  COMPARISON:  None.  FINDINGS: Degenerative osteoarthrosis noted about both hips. There is mild deformity about the left femoral head and neck, which appears new relative to most recent CT from 02/15/2013. While this finding may be related to chronic degenerative changes, a possible acute subtle subcapital fracture of the left femoral head/neck is not entirely excluded.  Right hip is grossly intact. Bony pelvis is intact. SI joints are approximated. No pubic  diastasis.  No soft tissue abnormality.  IMPRESSION: 1. Irregularity about the left femoral head and neck. While these findings are likely related to chronic underlying degenerative changes, a possible subtle acute nondisplaced subcapital fracture of the left femoral neck is not entirely excluded. Correlation with physical exam recommended. Additionally, dedicated radiographs of the left hip may be helpful for further evaluation. 2. No other acute abnormality within the pelvis.   Electronically Signed   By: Jeannine Boga M.D.   On: 04/23/2014 06:21  Dg Hip Complete Left  04/23/2014   CLINICAL DATA:  Atraumatic left groin pain ; history of dementia; limited history available  EXAM: LEFT HIP - COMPLETE 2+ VIEW  COMPARISON:  AP pelvis of April 23, 2014  FINDINGS: The bony pelvis is adequately mineralized for age. There is no acute fracture. There is degenerative change of the hip joints greater on the left than on the right. No acute hip fracture or dislocation is demonstrated. The overlying soft tissues are unremarkable. There is no evidence of gas within a left inguinal hernia. There is an aorto bi-iliac stent graft in place.  IMPRESSION: There are degenerative changes of the left hip but there is no acute fracture nor dislocation. No acute abnormality elsewhere is demonstrated.   Electronically Signed   By: David  Martinique   On: 04/23/2014 08:36   Dg Chest Port 1 View  04/23/2014   CLINICAL DATA:  80 year old female with left pneumothorax discovered on CT Abdomen and Pelvis at 0856 hrs. Initial encounter.  EXAM: PORTABLE CHEST - 1 VIEW  COMPARISON:  CT Abdomen and Pelvis from today reported separately. Chest radiographs 0156 hrs.  FINDINGS: Moderate size left pneumothorax, stable since 0156 hrs. Pleural edge visible laterally to the lung base. Mild associated atelectasis. No mediastinal shift. Normal cardiac size and mediastinal contours. Visualized tracheal air column is within normal limits. No  confluent pulmonary opacity elsewhere.  IMPRESSION: Moderate size left pneumothorax, no mediastinal shift or other acute cardiopulmonary abnormality.   Electronically Signed   By: Lars Pinks M.D.   On: 04/23/2014 16:28     EKG Interpretation None      MDM   Final diagnoses:  Inability to ambulate due to hip  Leukocytosis    2:06 AM Labs and chest xray pending. Vitals stable and patient afebrile.   Labs show elevated WBC without other acute changes. Patient's urinalysis and chest xray show no signs of infection. Blood cultures and influenza pending. Patient is unable to ambulate due to pain which is not baseline for her according to the family. Patient is neurologically intact and I doubt ischemic etiology at this time. Patient discussed with Dr. Kathrynn Humble who agrees for admission for this patient.    Alvina Chou, PA-C 04/23/14 2138

## 2014-04-23 NOTE — ED Provider Notes (Signed)
Received a call from radiology. I was not directly involved in this patient's care. She is under the care of the hospitalist at this time. CT scan ordered to rule out retroperitoneal bleed. Incidental finding on CT shows sizable pneumothorax on the left. I have evaluated the patient. Vital signs are stable. She is in no acute distress. She is on continuous cardiac monitoring and was placed on supplemental oxygen. Dr. Sheran Fava, [facility] informed of radiology finding.  Merryl Hacker, MD 04/23/14 [phone]

## 2014-04-24 ENCOUNTER — Telehealth: Payer: Self-pay | Admitting: Neurology

## 2014-04-24 ENCOUNTER — Observation Stay (HOSPITAL_COMMUNITY): Payer: Medicare Other

## 2014-04-24 ENCOUNTER — Inpatient Hospital Stay (HOSPITAL_COMMUNITY): Payer: Medicare Other

## 2014-04-24 LAB — COMPREHENSIVE METABOLIC PANEL
ALK PHOS: 75 U/L (ref 39–117)
ALT: 14 U/L (ref 0–35)
ANION GAP: 13 (ref 5–15)
AST: 21 U/L (ref 0–37)
Albumin: 3.4 g/dL — ABNORMAL LOW (ref 3.5–5.2)
BILIRUBIN TOTAL: 0.7 mg/dL (ref 0.3–1.2)
BUN: 21 mg/dL (ref 6–23)
CHLORIDE: 99 meq/L (ref 96–112)
CO2: 25 mEq/L (ref 19–32)
CREATININE: 1.34 mg/dL — AB (ref 0.50–1.10)
Calcium: 9.1 mg/dL (ref 8.4–10.5)
GFR calc non Af Amer: 34 mL/min — ABNORMAL LOW (ref >90)
GFR, EST AFRICAN AMERICAN: 39 mL/min — AB (ref >90)
Glucose, Bld: 143 mg/dL — ABNORMAL HIGH (ref 70–99)
Potassium: 3.6 mEq/L — ABNORMAL LOW (ref 3.7–5.3)
Sodium: 137 mEq/L (ref 137–147)
TOTAL PROTEIN: 6.7 g/dL (ref 6.0–8.3)

## 2014-04-24 LAB — CBC
HCT: 36.9 % (ref 36.0–46.0)
Hemoglobin: 12.3 g/dL (ref 12.0–15.0)
MCH: 30.7 pg (ref 26.0–34.0)
MCHC: 33.3 g/dL (ref 30.0–36.0)
MCV: 92 fL (ref 78.0–100.0)
PLATELETS: 124 10*3/uL — AB (ref 150–400)
RBC: 4.01 MIL/uL (ref 3.87–5.11)
RDW: 12.6 % (ref 11.5–15.5)
WBC: 14.6 10*3/uL — ABNORMAL HIGH (ref 4.0–10.5)

## 2014-04-24 MED ORDER — POTASSIUM CHLORIDE CRYS ER 20 MEQ PO TBCR
40.0000 meq | EXTENDED_RELEASE_TABLET | Freq: Once | ORAL | Status: AC
  Administered 2014-04-24: 40 meq via ORAL
  Filled 2014-04-24: qty 2

## 2014-04-24 MED ORDER — FUROSEMIDE 40 MG PO TABS
40.0000 mg | ORAL_TABLET | Freq: Every day | ORAL | Status: DC
  Administered 2014-04-24 – 2014-04-25 (×2): 40 mg via ORAL
  Filled 2014-04-24 (×2): qty 1

## 2014-04-24 MED ORDER — ATORVASTATIN CALCIUM 20 MG PO TABS
20.0000 mg | ORAL_TABLET | Freq: Every day | ORAL | Status: DC
  Administered 2014-04-24 – 2014-04-26 (×3): 20 mg via ORAL
  Filled 2014-04-24 (×2): qty 2
  Filled 2014-04-24: qty 1
  Filled 2014-04-24: qty 2
  Filled 2014-04-24: qty 1

## 2014-04-24 MED ORDER — HYDRALAZINE HCL 25 MG PO TABS
25.0000 mg | ORAL_TABLET | Freq: Three times a day (TID) | ORAL | Status: DC
  Administered 2014-04-24 (×3): 25 mg via ORAL
  Filled 2014-04-24 (×3): qty 1

## 2014-04-24 NOTE — Telephone Encounter (Signed)
Pt appt was cancelled for 04-25-14 due to the fact she is in the hospital she resch appt to 05-26-14

## 2014-04-24 NOTE — Procedures (Signed)
Thoracentesis Procedure Note  Pre-operative Diagnosis: left pneumothorax   Post-operative Diagnosis: same  Indications: resolution of pneumothorax   Procedure Details  Consent: Informed consent was obtained. Risks of the procedure were discussed including: infection, bleeding, pain, pneumothorax.  Under sterile conditions the patient was positioned. Chlorahexadine solution and sterile drapes were utilized.  1% buffered lidocaine was used to anesthetize the left lateral rib space which was identified via real time [place]. Air rush was obtained without any difficulties and minimal blood loss.  A dressing was applied to the wound and wound care instructions were provided.   Findings Estimate 300 ml of air removed. With good sliding lung noted after completion  Complications:  None; patient tolerated the procedure well.          Condition: stable  Plan A follow up chest x-ray was ordered. Bed Rest for 0 hours. Tylenol 650 mg. for pain.   Procedure performed by Marni Griffon NP under my direct supervision  Roselie Awkward, MD [place] [facility] Pager: [phone] Cell: [phone] If no response, call [phone]

## 2014-04-24 NOTE — ED Provider Notes (Signed)
Medical screening examination/treatment/procedure(s) were conducted as a shared visit with non-physician practitioner(s) and myself.  I personally evaluated the patient during the encounter.   EKG Interpretation None      Pt comes in with cc of diffuse pain. Pt woke up in the middle of her sleep, with diffuse body aches and chills. She went to bed fine. Pt denies nausea, emesis, fevers, chills, chest pains, shortness of breath, headaches, abdominal pain, uti like symptoms. She however feels weak, and has hip pain, with normal strength in the hip on exam, and passive ROM is normal. Leukocytosis - unexplained. Infection workup, ACS workup started, if unable to ambulate, we shall admit.   Varney Biles, MD 04/24/14 2300

## 2014-04-24 NOTE — Evaluation (Addendum)
Physical Therapy Evaluation Patient Details Name: Caroline Underwood MRN: [id number] DOB: 06-Jul-1922 Today's Date: 04/24/2014   History of Present Illness  10/7  Admit for hip pain, plain films neg for hip fx, DJD.  CT abd performed to r/o RP bleed and incidental finding of L PTX. post thoracentesis  04/24/14 for resolution of pneumothorax.  Clinical Impression  Patient tolerated well, no noted SOB. Patient willl benefit from PT while on acute  to address  Problems listed in note below. Patient did c/o discomfort through groin/LLE>R during mobility and weight bearing. Limited  Weight noted  On LLE,during transfers.    Follow Up Recommendations SNF;Supervision/Assistance - 24 hour (unless family can provide care.)    Equipment Recommendations  None recommended by PT    Recommendations for Other Services       Precautions / Restrictions Precautions Precautions: Fall Precaution Comments: pain with WB L le      Mobility  Bed Mobility Overal bed mobility: Needs Assistance Bed Mobility: Supine to Sit     Supine to sit: Mod assist;HOB elevated     General bed mobility comments: extra time to mobilize, cues for technique  Transfers Overall transfer level: Needs assistance Equipment used: Rolling walker (2 wheeled) Transfers: Sit to/from [facility] Sit to Stand: Mod assist;From elevated surface Stand pivot transfers: Mod assist       General transfer comment: cues for hand placement, lifting assist to stand, steady asist once in standing ans patient guarding due to c/o pain of groin and L leg when weight bearing. samll steps  to turn around to [facility] then again to recliner, multimodal; cues for turning, use of RW  Ambulation/Gait                Stairs            Wheelchair Mobility    Modified Rankin (Stroke Patients Only)       Balance Overall balance assessment: Needs assistance;History of Falls Sitting-balance support: Bilateral upper  extremity supported;Feet supported Sitting balance-Leahy Scale: Fair   Postural control: Posterior lean Standing balance support: During functional activity;Bilateral upper extremity supported Standing balance-Leahy Scale: Poor Standing balance comment: retropulsive initially upon standing.                             Pertinent Vitals/Pain Pain Assessment: Faces Faces Pain Scale: Hurts even more Pain Descriptors / Indicators: Discomfort;Grimacing;Tender Pain Intervention(s): Limited activity within patient's tolerance;Monitored during session;Repositioned sats > 945 on 2 l, HR 70's    Home Living Family/patient expects to be discharged to:: Private residence Living Arrangements: Children Available Help at Discharge: Family Type of Home: House         Home Equipment: Environmental consultant - 2 wheels      Prior Function Level of Independence: Needs assistance   Gait / Transfers Assistance Needed: until fall several days ago, up ad lib with RW per son  ADL's / Homemaking Assistance Needed: family assists with meals and  dressing/bath        Hand Dominance        Extremity/Trunk Assessment               Lower Extremity Assessment: Generalized weakness      Cervical / Trunk Assessment: Kyphotic  Communication      Cognition Arousal/Alertness: Awake/alert Behavior During Therapy: WFL for tasks assessed/performed Overall Cognitive Status: Within Functional Limits for tasks assessed  General Comments      Exercises        Assessment/Plan    PT Assessment Patient needs continued PT services  PT Diagnosis Difficulty walking;Generalized weakness;Acute pain   PT Problem List Decreased strength;Decreased range of motion;Decreased activity tolerance;Decreased balance;Decreased mobility;Decreased knowledge of precautions;Decreased safety awareness;Decreased knowledge of use of DME;Pain  PT Treatment Interventions DME  instruction;Gait training;Functional mobility training;Therapeutic activities;Therapeutic exercise;Patient/family education   PT Goals (Current goals can be found in the Care Plan section) Acute Rehab PT Goals Patient Stated Goal: agreed to getting up PT Goal Formulation: With patient/family Time For Goal Achievement: 05/08/14 Potential to Achieve Goals: Good    Frequency Min 3X/week   Barriers to discharge Decreased caregiver support      Co-evaluation PT/OT/SLP Co-Evaluation/Treatment: Yes Reason for Co-Treatment: Complexity of the patient's impairments (multi-system involvement);For patient/therapist safety PT goals addressed during session: Mobility/safety with mobility         End of Session Equipment Utilized During Treatment: Gait belt, oxygen Activity Tolerance: Patient limited by fatigue;Patient tolerated treatment well Patient left: in chair;with call bell/phone within reach;with chair alarm set;with family/visitor present Nurse Communication: Mobility status         Time: 4401-0272 PT Time Calculation (min): 38 min   Charges:   PT Evaluation $Initial PT Evaluation Tier I: 1 Procedure PT Treatments $Therapeutic Activity: 8-22 mins   PT G Codes:          Claretha Cooper 04/24/2014, 3:41 PM.sign

## 2014-04-24 NOTE — Progress Notes (Signed)
Name: Caroline Underwood MRN: [id number] DOB: 12-26-1921    ADMISSION DATE:  04/23/2014 CONSULTATION DATE:  04/23/14  REFERRING MD :  Dr. Sheran Fava   CHIEF COMPLAINT:  Pneumothorax   BRIEF PATIENT DESCRIPTION: 80 y/o F admitted per [facility] for lower abd/hip pain.  CT Abd noted an incidental finding large L pneumothorax.  PCCM consulted for evaluation of PTX.    SIGNIFICANT EVENTS  10/7  Admit for hip pain, plain films neg for hip fx, DJD.  CT abd performed to r/o RP bleed and incidental finding of L PTX  STUDIES:  10/7  CXR > L PTX, emphysema  10/7  Pelvis XR >> irregular L femoral head/neck, ? DJD vs subtle acute non-displaced subcapital fx 10/7  L hip complete XR > degenerative changes, no acute fx   SUBJECTIVE:  No distress. Comfortable  VITAL SIGNS: Temp:  [97.8 F (36.6 C)-98.8 F (37.1 C)] 98.1 F (36.7 C) (10/08 0353) Pulse Rate:  [60-95] 75 (10/08 0500) Resp:  [14-27] 20 (10/08 0500) BP: (92-210)/(21-153) 187/54 mmHg (10/08 0500) SpO2:  [89 %-99 %] 91 % (10/08 0500) Weight:  [56.1 kg (123 lb 10.9 oz)-56.8 kg (125 lb 3.5 oz)] 56.8 kg (125 lb 3.5 oz) (10/08 0353)  PHYSICAL EXAMINATION: General:  wdwn elderly female in NAD  Neuro:  Awake / alert, appropriate in conversation, MAE. Confused at times  HEENT:  Mm pin/moist, poor dentition  Cardiovascular:  s1s2 rrr, no m/r/g  Lungs:  resp's even/non-labored, diminished on L, clear on R  Abdomen:  Round/soft, bsx4 active Musculoskeletal:  No acute deformities  Skin:  Warm/dry, no edema    Recent Labs Lab 04/23/14 0219 04/24/14 0328  NA 139 137  K 3.8 3.6*  CL 99 99  CO2 25 25  BUN 24* 21  CREATININE 1.50* 1.34*  GLUCOSE 163* 143*    Recent Labs Lab 04/23/14 0219 04/24/14 0328  HGB 13.3 12.3  HCT 37.7 36.9  WBC 18.0* 14.6*  PLT 176 124*   Ct Abdomen Pelvis Wo Contrast  04/23/2014   CLINICAL DATA:  Evaluate for retroperitoneal hemorrhage ; bilateral groin aching also chills ; history of previous abdominal  aortic aneurysm repair, COPD, common chronic renal insufficiency  EXAM: CT ABDOMEN AND PELVIS WITHOUT CONTRAST  TECHNIQUE: Multidetector CT imaging of the abdomen and pelvis was performed following the standard protocol without IV contrast.  COMPARISON:  PA and lateral chest x-ray of April 23, 2014 and pelvis series of the same day  FINDINGS: There is a large left-sided pneumothorax. There is a small amount of pleural fluid layering posteriorly on the left.  There is an aorto bi-iliac stent graft in place. The maximal AP diameter of the stented abdominal aorta is 3.9 cm which is stable. There is no periaortic hemorrhage. The peri-iliac soft tissues are unremarkable. The psoas musculature is normal. There is no intraperitoneal nor retroperitoneal hemorrhage.  The liver, gallbladder, pancreas, spleen, adrenal glands, and kidneys exhibit no acute abnormalities. There is chronic right renal atrophy. There is a stable 5.5 cm mid to lower pole left renal cyst r. The small and large bowel exhibit no acute abnormalities. There is diverticulosis. The appendix is normal. The urinary bladder is normal. The uterus is small appropriate for age. There are no adnexal masses. There is no inguinal nor umbilical hernia.  IMPRESSION: 1. There is a large left-sided pneumothorax with small left pleural effusion. 2. The aorto bi-iliac stent graft and surrounding soft tissues are unremarkable. 3. There is no intraperitoneal nor retroperitoneal  hemorrhage 4. There is chronic right renal atrophy. 5. Critical Value/emergent results were called by telephone at the time of interpretation on 04/23/2014 at 9:22 am to Dr. Dina Rich, who verbally acknowledged these results.   Electronically Signed   By: David  Martinique   On: 04/23/2014 09:38   Dg Chest 2 View  04/23/2014   ADDENDUM REPORT: 04/23/2014 18:04  ADDENDUM: A left-sided pneumothorax is present. The clinical team was made aware.   Electronically Signed   By: Lawrence Santiago M.D.   On:  04/23/2014 18:04   04/23/2014   CLINICAL DATA:  Initial encounter for body aches and chills. Fever. Next item  EXAM: CHEST  2 VIEW  COMPARISON:  One-view chest 02/16/2013.  FINDINGS: The heart size is normal. Emphysema is again noted. No focal airspace disease is evident. Atherosclerotic changes are noted at the aortic arch. The visualized soft tissues and bony thorax are unremarkable.  IMPRESSION: 1. No acute cardiopulmonary disease or significant interval change. 2. Emphysema. 3. Atherosclerosis.  Electronically Signed: By: Lawrence Santiago M.D. On: 04/23/2014 02:23   Dg Pelvis 1-2 Views  04/23/2014   CLINICAL DATA:  Pelvic pain.  No known trauma.  EXAM: PELVIS - 1-2 VIEW  COMPARISON:  None.  FINDINGS: Degenerative osteoarthrosis noted about both hips. There is mild deformity about the left femoral head and neck, which appears new relative to most recent CT from 02/15/2013. While this finding may be related to chronic degenerative changes, a possible acute subtle subcapital fracture of the left femoral head/neck is not entirely excluded.  Right hip is grossly intact. Bony pelvis is intact. SI joints are approximated. No pubic diastasis.  No soft tissue abnormality.  IMPRESSION: 1. Irregularity about the left femoral head and neck. While these findings are likely related to chronic underlying degenerative changes, a possible subtle acute nondisplaced subcapital fracture of the left femoral neck is not entirely excluded. Correlation with physical exam recommended. Additionally, dedicated radiographs of the left hip may be helpful for further evaluation. 2. No other acute abnormality within the pelvis.   Electronically Signed   By: Jeannine Boga M.D.   On: 04/23/2014 06:21   Dg Hip Complete Left  04/23/2014   CLINICAL DATA:  Atraumatic left groin pain ; history of dementia; limited history available  EXAM: LEFT HIP - COMPLETE 2+ VIEW  COMPARISON:  AP pelvis of April 23, 2014  FINDINGS: The bony pelvis is  adequately mineralized for age. There is no acute fracture. There is degenerative change of the hip joints greater on the left than on the right. No acute hip fracture or dislocation is demonstrated. The overlying soft tissues are unremarkable. There is no evidence of gas within a left inguinal hernia. There is an aorto bi-iliac stent graft in place.  IMPRESSION: There are degenerative changes of the left hip but there is no acute fracture nor dislocation. No acute abnormality elsewhere is demonstrated.   Electronically Signed   By: David  [place]   On: 04/23/2014 08:36   Dg Chest Port 1 View  04/24/2014   CLINICAL DATA:  80 year old female with known pneumothorax.  EXAM: PORTABLE CHEST - 1 VIEW  COMPARISON:  Chest x-ray 04/23/2014.  FINDINGS: Again noted is a moderate left-sided pneumothorax (approximately 20% volume of the left hemithorax). This appears slightly decreased in size compared to yesterday's examination. No chest tube is identified at this time. Right lung is well-expanded, without pneumothorax or consolidative airspace disease. No pleural effusions. No evidence of pulmonary edema. Heart size  is normal. Mediastinal contours are unremarkable. Atherosclerosis in the thoracic aorta.  IMPRESSION: 1. Moderate left-sided pneumothorax appears slightly decreased in size compared to yesterday's examination, estimated to occupy approximately 20% of the volume of the left hemithorax the present time. 2. No mediastinal shift or other findings to suggest tension pneumothorax. 3. Atherosclerosis.   Electronically Signed   By: Vinnie Langton M.D.   On: 04/24/2014 07:21   Dg Chest Port 1 View  04/23/2014   CLINICAL DATA:  80 year old female with left pneumothorax discovered on CT Abdomen and Pelvis at 0856 hrs. Initial encounter.  EXAM: PORTABLE CHEST - 1 VIEW  COMPARISON:  CT Abdomen and Pelvis from today reported separately. Chest radiographs 0156 hrs.  FINDINGS: Moderate size left pneumothorax, stable since  0156 hrs. Pleural edge visible laterally to the lung base. Mild associated atelectasis. No mediastinal shift. Normal cardiac size and mediastinal contours. Visualized tracheal air column is within normal limits. No confluent pulmonary opacity elsewhere.  IMPRESSION: Moderate size left pneumothorax, no mediastinal shift or other acute cardiopulmonary abnormality.   Electronically Signed   By: Lars Pinks M.D.   On: 04/23/2014 16:28  no sig change in left PTX   ASSESSMENT / PLAN:  Left Pneumothorax - appears to be a spontaneous pneumothorax, no acute pulmonary symptoms Atypical Pleural Thickening & Nodularity  Emphysema  Former Tobacco Abuse   Plan: [place] cath today in effort to resolve PTX. Will place on 20 cmH2O sxn, hopefully adv quickly to water seal and removal  PCXR in 4 hours, then in am Hopeful for conservative management as pt is in no distress and anticoagulation complicates placement Oxygen to support sats 90-94%   AFlutter - on Xarelto (being held in anticipation of CT)   Delirium  Family states often occurs when hospitalized.  Plan Supportive care  PCCM will follow along with you.  Thank you for the consultation.   Marni Griffon ACNP-BC [place] Pager # [phone] OR # [phone] if no answer   Attending:  I have seen and examined the patient with nurse practitioner/resident and agree with the note above.   Because her pneumothorax is improving and she tends to pull out every medical device we attempt to put in her, we will perform aspiration of pleural air rather than placing a pigtail.  If this doesn't work then we will put in a pigtail.  Roselie Awkward, MD [place] [facility] Pager: [phone] Cell: [phone] If no response, call [phone]    04/24/2014, 8:45 AM

## 2014-04-24 NOTE — Care Management Note (Signed)
    Page 1 of 1   04/24/2014     2:43:19 PM CARE MANAGEMENT NOTE 04/24/2014  Patient:  Caroline Underwood, Caroline Underwood   Account Number:  [id number]  Date Initiated:  04/24/2014  Documentation initiated by:  DAVIS,RHONDA  Subjective/Objective Assessment:   80 yo female with pneuothorax and elevated wbc,confused and combative     Action/Plan:   from home lives alone may need short term rehab post stay   Anticipated DC Date:  04/27/2014   Anticipated DC Plan:  HOME/SELF CARE  In-house referral  Clinical Social Worker      DC Planning Services  CM consult      Choice offered to / List presented to:             Status of service:  In process, will continue to follow Medicare Important Message given?   (If response is "NO", the following Medicare IM given date fields will be blank) Date Medicare IM given:   Medicare IM given by:   Date Additional Medicare IM given:   Additional Medicare IM given by:    Discharge Disposition:    Per UR Regulation:  Reviewed for med. necessity/level of care/duration of stay  If discussed at [place] of Stay Meetings, dates discussed:    Comments:  [id number]/Rhonda Rosana Hoes, RN, BSN, CCM Chart reviewed. Discharge needs and patient's stay to be reviewed and followed by case manager.

## 2014-04-24 NOTE — Progress Notes (Signed)
[facility] PROGRESS NOTE  Caroline Underwood BFX:[id number] DOB: 1922-05-01 DOA: 04/23/2014 PCP: Lucretia Kern., DO  Assessment/Plan  Acute hypoxic respiratory failure secondary to moderate to large left pneumothorax, currently asymptomatic  - Appreciate pulmonary assistance  - PTX appears moderate sized and possibly slightly improved this AM - continue oxygen via nasal canula to keep O2 sat > 92%  Leukocytosis, patient afebrile and WBC trending down - CXR and UA unremarkable  - Hold abx for now as she does not meet criteria for SIRS/sepsis   Pelvic pain/hip pain without evidence of fracture.  Consider PMR - XR left hip negative for acute fracture - CT abd/pelvis without contrast:  No evidence of retroperitoneal bleed  - PT/OT pending - Tylenol and hydrocodone prn pain  -  Check ESR   COPD, stable. Continue LABA/ICS   Atrial flutter  - ECG NSR with inverted T-waves in V4-V6 - Hold a/c for now  - Continue metoprolol   HTN, blood pressure elevated  - Continue norvasc and metoprolol - add hydralazine scheduled -  Continue prn IV hydralazine  HLD, stable and CK wnl.  LFTs wnl -  Resume statin  CKD stage 3/4, baseline creatinine 1.6-.1.9, currently 1.34 - Renally dose medications  - Minimize nephrotoxins  - resume lasix today  Hypothyroidism, stable, monitored by PCP last month. Continue levothyroxine   Dementia, stable, on aricept   Mild thrombocytopenia, may be secondary to marrow suppression from acute illness -  Trend plt  Mild hypokalemia -  Oral potassium supplementation  Diet: regular  Access: PIV  IVF: Off  Proph: SCDs pending further eval for hip pain   Code Status: DNR - patient states she has not discussed yet with family  Family Communication: patient alone  Disposition Plan:  Continue stepdown until reevaluated by pulmonary critical care  Consultants:  Pulmonology, Dr. Lake Bells  Procedures:  CXR  XR pelvis:  Possible left subcapital  fracture  XR left hip:  No evidence of fracture  CT ab/p without contrast:  Moderate to large left PTX   Antibiotics:  none   HPI/Subjective:  Denies pleuritic chest pain, chest tightness, difficulty breathing, cough, nausea.  Has been able to move her bowels and is voiding easily.  Ate dinner yesterday.  Hips are still very sore.   Objective: Filed Vitals:   04/24/14 0300 04/24/14 0353 04/24/14 0400 04/24/14 0500  BP: 168/61  165/52 187/54  Pulse: 65  69 75  Temp:  98.1 F (36.7 C)    TempSrc:  Oral    Resp: _0 Height:      Weight:  56.8 kg (125 lb 3.5 oz)    SpO2: 92%  92% 91%    Intake/Output Summary (Last 24 hours) at 04/24/14 0747 Last data filed at 04/24/14 0600  Gross per 24 hour  Intake    240 ml  Output    800 ml  Net   -560 ml   Filed Weights   04/23/14 1250 04/24/14 0353  Weight: 56.1 kg (123 lb 10.9 oz) 56.8 kg (125 lb 3.5 oz)    Exam:   General:  WF, No acute distress  HEENT:  NCAT, MMM  Cardiovascular:  IRRR, nl S1, S2 no mrg, 2+ pulses, warm extremities  Respiratory:  Diminished anterior left chest, but she was unable to sit up for posterior exam today, no focal rales or rhonchi or wheeze anteriorly, no increased WOB  Abdomen:   NABS, soft, ND, mild TTP in inferior quadrants without  rebound or guarding  MSK:   Normal tone and bulk, no LEE.  Able to move toes, ankles, and knees.  Less pain with passive hip flexion of the left hip but persistent pain of the right hip  Neuro:  Grossly intact  Data Reviewed: Basic Metabolic Panel:  Recent Labs Lab 04/23/14 0219 04/24/14 0328  NA 139 137  K 3.8 3.6*  CL 99 99  CO2 25 25  GLUCOSE 163* 143*  BUN 24* 21  CREATININE 1.50* 1.34*  CALCIUM 9.3 9.1   Liver Function Tests:  Recent Labs Lab 04/23/14 1020 04/24/14 0328  AST 21 21  ALT 16 14  ALKPHOS 79 75  BILITOT 0.8 0.7  PROT 6.8 6.7  ALBUMIN 3.5 3.4*    Recent Labs Lab 04/23/14 1020  LIPASE 33   No results found for  this basename: AMMONIA,  in the last 168 hours CBC:  Recent Labs Lab 04/23/14 0219 04/24/14 0328  WBC 18.0* 14.6*  NEUTROABS 15.4*  --   HGB 13.3 12.3  HCT 37.7 36.9  MCV 90.2 92.0  PLT 176 124*   Cardiac Enzymes:  Recent Labs Lab 04/23/14 0613 04/23/14 1020 04/23/14 1511 04/23/14 2114  CKTOTAL 67  --   --   --   TROPONINI  --  <0.30 <0.30 <0.30   BNP (last 3 results) No results found for this basename: PROBNP,  in the last 8760 hours CBG: No results found for this basename: GLUCAP,  in the last 168 hours  Recent Results (from the past 240 hour(s))  MRSA PCR SCREENING     Status: None   Collection Time    04/23/14 12:52 PM      Result Value Ref Range Status   MRSA by PCR NEGATIVE  NEGATIVE Final   Comment:            The GeneXpert MRSA Assay (FDA     approved for NASAL specimens     only), is one component of a     comprehensive MRSA colonization     surveillance program. It is not     intended to diagnose MRSA     infection nor to guide or     monitor treatment for     MRSA infections.     Studies: Ct Abdomen Pelvis Wo Contrast  04/23/2014   CLINICAL DATA:  Evaluate for retroperitoneal hemorrhage ; bilateral groin aching also chills ; history of previous abdominal aortic aneurysm repair, COPD, common chronic renal insufficiency  EXAM: CT ABDOMEN AND PELVIS WITHOUT CONTRAST  TECHNIQUE: Multidetector CT imaging of the abdomen and pelvis was performed following the standard protocol without IV contrast.  COMPARISON:  PA and lateral chest x-ray of April 23, 2014 and pelvis series of the same day  FINDINGS: There is a large left-sided pneumothorax. There is a small amount of pleural fluid layering posteriorly on the left.  There is an aorto bi-iliac stent graft in place. The maximal AP diameter of the stented abdominal aorta is 3.9 cm which is stable. There is no periaortic hemorrhage. The peri-iliac soft tissues are unremarkable. The psoas musculature is normal. There  is no intraperitoneal nor retroperitoneal hemorrhage.  The liver, gallbladder, pancreas, spleen, adrenal glands, and kidneys exhibit no acute abnormalities. There is chronic right renal atrophy. There is a stable 5.5 cm mid to lower pole left renal cyst r. The small and large bowel exhibit no acute abnormalities. There is diverticulosis. The appendix is normal. The urinary bladder is normal. The  uterus is small appropriate for age. There are no adnexal masses. There is no inguinal nor umbilical hernia.  IMPRESSION: 1. There is a large left-sided pneumothorax with small left pleural effusion. 2. The aorto bi-iliac stent graft and surrounding soft tissues are unremarkable. 3. There is no intraperitoneal nor retroperitoneal hemorrhage 4. There is chronic right renal atrophy. 5. Critical Value/emergent results were called by telephone at the time of interpretation on 04/23/2014 at 9:22 am to Dr. Dina Rich, who verbally acknowledged these results.   Electronically Signed   By: David  Martinique   On: 04/23/2014 09:38   Dg Chest 2 View  04/23/2014   ADDENDUM REPORT: 04/23/2014 18:04  ADDENDUM: A left-sided pneumothorax is present. The clinical team was made aware.   Electronically Signed   By: Lawrence Santiago M.D.   On: 04/23/2014 18:04   04/23/2014   CLINICAL DATA:  Initial encounter for body aches and chills. Fever. Next item  EXAM: CHEST  2 VIEW  COMPARISON:  One-view chest 02/16/2013.  FINDINGS: The heart size is normal. Emphysema is again noted. No focal airspace disease is evident. Atherosclerotic changes are noted at the aortic arch. The visualized soft tissues and bony thorax are unremarkable.  IMPRESSION: 1. No acute cardiopulmonary disease or significant interval change. 2. Emphysema. 3. Atherosclerosis.  Electronically Signed: By: Lawrence Santiago M.D. On: 04/23/2014 02:23   Dg Pelvis 1-2 Views  04/23/2014   CLINICAL DATA:  Pelvic pain.  No known trauma.  EXAM: PELVIS - 1-2 VIEW  COMPARISON:  None.  FINDINGS:  Degenerative osteoarthrosis noted about both hips. There is mild deformity about the left femoral head and neck, which appears new relative to most recent CT from 02/15/2013. While this finding may be related to chronic degenerative changes, a possible acute subtle subcapital fracture of the left femoral head/neck is not entirely excluded.  Right hip is grossly intact. Bony pelvis is intact. SI joints are approximated. No pubic diastasis.  No soft tissue abnormality.  IMPRESSION: 1. Irregularity about the left femoral head and neck. While these findings are likely related to chronic underlying degenerative changes, a possible subtle acute nondisplaced subcapital fracture of the left femoral neck is not entirely excluded. Correlation with physical exam recommended. Additionally, dedicated radiographs of the left hip may be helpful for further evaluation. 2. No other acute abnormality within the pelvis.   Electronically Signed   By: Jeannine Boga M.D.   On: 04/23/2014 06:21   Dg Hip Complete Left  04/23/2014   CLINICAL DATA:  Atraumatic left groin pain ; history of dementia; limited history available  EXAM: LEFT HIP - COMPLETE 2+ VIEW  COMPARISON:  AP pelvis of April 23, 2014  FINDINGS: The bony pelvis is adequately mineralized for age. There is no acute fracture. There is degenerative change of the hip joints greater on the left than on the right. No acute hip fracture or dislocation is demonstrated. The overlying soft tissues are unremarkable. There is no evidence of gas within a left inguinal hernia. There is an aorto bi-iliac stent graft in place.  IMPRESSION: There are degenerative changes of the left hip but there is no acute fracture nor dislocation. No acute abnormality elsewhere is demonstrated.   Electronically Signed   By: David  Martinique   On: 04/23/2014 08:36   Dg Chest Port 1 View  04/24/2014   CLINICAL DATA:  80 year old female with known pneumothorax.  EXAM: PORTABLE CHEST - 1 VIEW   COMPARISON:  Chest x-ray 04/23/2014.  FINDINGS: Again  noted is a moderate left-sided pneumothorax (approximately 20% volume of the left hemithorax). This appears slightly decreased in size compared to yesterday's examination. No chest tube is identified at this time. Right lung is well-expanded, without pneumothorax or consolidative airspace disease. No pleural effusions. No evidence of pulmonary edema. Heart size is normal. Mediastinal contours are unremarkable. Atherosclerosis in the thoracic aorta.  IMPRESSION: 1. Moderate left-sided pneumothorax appears slightly decreased in size compared to yesterday's examination, estimated to occupy approximately 20% of the volume of the left hemithorax the present time. 2. No mediastinal shift or other findings to suggest tension pneumothorax. 3. Atherosclerosis.   Electronically Signed   By: Vinnie Langton M.D.   On: 04/24/2014 07:21   Dg Chest Port 1 View  04/23/2014   CLINICAL DATA:  80 year old female with left pneumothorax discovered on CT Abdomen and Pelvis at 0856 hrs. Initial encounter.  EXAM: PORTABLE CHEST - 1 VIEW  COMPARISON:  CT Abdomen and Pelvis from today reported separately. Chest radiographs 0156 hrs.  FINDINGS: Moderate size left pneumothorax, stable since 0156 hrs. Pleural edge visible laterally to the lung base. Mild associated atelectasis. No mediastinal shift. Normal cardiac size and mediastinal contours. Visualized tracheal air column is within normal limits. No confluent pulmonary opacity elsewhere.  IMPRESSION: Moderate size left pneumothorax, no mediastinal shift or other acute cardiopulmonary abnormality.   Electronically Signed   By: Lars Pinks M.D.   On: 04/23/2014 16:28    Scheduled Meds: . amLODipine  5 mg Oral Daily  . [START ON 04/29/2014] cholecalciferol  1,000 Units Oral Weekly  . docusate sodium  100 mg Oral BID  . donepezil  10 mg Oral QHS  . hydrALAZINE  25 mg Oral TID  . levothyroxine  37.5 mcg Oral QAC breakfast  .  metoprolol  50 mg Oral BID  . mometasone-formoterol  2 puff Inhalation BID  . senna  1 tablet Oral BID   Continuous Infusions:   Active Problems:   Atrial fibrillation   Hyperlipemia   Chronic kidney disease   COPD (chronic obstructive pulmonary disease)   Iron deficiency anemia   Abdominal pain, epigastric   Abdominal pain   Pneumothorax    Time spent: 30 min    Lindy Pennisi, [facility]  [facility] Pager [phone]. If 7PM-7AM, please contact night-coverage at [email or web address], password [facility] 04/24/2014, 7:47 AM  LOS: 1 day

## 2014-04-24 NOTE — Progress Notes (Signed)
PT Cancellation Note  Patient Details Name: Caroline Underwood MRN: [id number] DOB: [birth year]   Cancelled Treatment:    Reason Eval/Treat Not Completed: Medical issues which prohibited therapy (to get CT, then OK to  see pt this PM if stable.)   Claretha Cooper 04/24/2014, 9:35 AM Tresa Endo PT [phone]

## 2014-04-24 NOTE — Evaluation (Signed)
Occupational Therapy Evaluation Patient Details Name: Caroline Underwood MRN: [id number] DOB: 1921-09-19 Today's Date: 04/24/2014    History of Present Illness 10/7  Admit for hip pain, plain films neg for hip fx, DJD.  CT abd performed to r/o RP bleed and incidental finding of L PTX. post thoracentesis  04/24/14 for resolution of pneumothorax.   Clinical Impression   Pt was admitted for hip pain and pneumothorax found.  She c/o groin pain when standing.  Pt had assistance with LB adls but she got to toilet on her own.  Will focus on this during OT.  Goals are for min guard overall:  She currently needs mod A.    Follow Up Recommendations  SNF    Equipment Recommendations  3 in 1 bedside comode    Recommendations for Other Services       Precautions / Restrictions Precautions Precautions: Fall Precaution Comments: pain with WB L le Restrictions Weight Bearing Restrictions: No      Mobility Bed Mobility Overal bed mobility: Needs Assistance Bed Mobility: Supine to Sit     Supine to sit: Mod assist;HOB elevated     General bed mobility comments: extra time to mobilize, cues for technique  Transfers Overall transfer level: Needs assistance Equipment used: Rolling walker (2 wheeled) Transfers: Sit to/from Stand Sit to Stand: Mod assist;From elevated surface Stand pivot transfers: Mod assist       General transfer comment: cues for hand placement, lifting assist to stand, steady assist once in standing.  patient guarding due to c/o pain of groin and L leg when weight bearing. small steps  to turn around to [facility] then again to recliner, multimodal; cues for turning, use of RW. Assistance to move walker forward and turn it    Balance Overall balance assessment: Needs assistance;History of Falls Sitting-balance support: Bilateral upper extremity supported;Feet supported Sitting balance-Leahy Scale: Fair   Postural control: Posterior lean Standing balance support: During  functional activity;Bilateral upper extremity supported Standing balance-Leahy Scale: Poor Standing balance comment: retropulsive initially upon standing.                            ADL Overall ADL's : Needs assistance/impaired                         Toilet Transfer: Moderate assistance;BSC;Stand-pivot   Toileting- Clothing Manipulation and Hygiene: Total assistance;Sit to/from stand         General ADL Comments: performed SPT to 3:1 commode then to recliner.  Pt did not assist with hygiene and normally she manages this herself.  She can perform UB adls and has had assistance for LB adls from daughter and aide.       Vision                     Perception     Praxis      Pertinent Vitals/Pain Pain Assessment: Faces Faces Pain Scale: Hurts even more (none when not weight bearing) Pain Location: groin Pain Descriptors / Indicators: Aching Pain Intervention(s): Limited activity within patient's tolerance;Monitored during session     Hand Dominance     Extremity/Trunk Assessment Upper Extremity Assessment Upper Extremity Assessment: Generalized weakness   Lower Extremity Assessment Lower Extremity Assessment: Generalized weakness   Cervical / Trunk Assessment Cervical / Trunk Assessment: Kyphotic   Communication Communication Communication: No difficulties   Cognition Arousal/Alertness: Awake/alert Behavior During Therapy: WFL for tasks  assessed/performed Overall Cognitive Status: Within Functional Limits for tasks assessed (some decreased memory; mostly wfls)  Unsure of baseline                     General Comments       Exercises       Shoulder Instructions      Home Living Family/patient expects to be discharged to:: Unsure Living Arrangements: Children Available Help at Discharge: Family Type of Home: House                       Home Equipment: Gilford Rile - 2 wheels   Additional Comments: was living with  son and daughter.  Had [facility] aide 2x per week for showers      Prior Functioning/Environment Level of Independence: Needs assistance  Gait / Transfers Assistance Needed: until fall several days ago, up ad lib with RW per son ADL's / Homemaking Assistance Needed: family assists with meals and  dressing/bath   Comments: assist for LB adls.  Used RW to get to bathroom    OT Diagnosis: Generalized weakness;Acute pain   OT Problem List: Decreased strength;Decreased activity tolerance;Impaired balance (sitting and/or standing);Decreased knowledge of use of DME or AE;Pain   OT Treatment/Interventions: Self-care/ADL training;DME and/or AE instruction;Patient/family education;Balance training    OT Goals(Current goals can be found in the care plan section) Acute Rehab OT Goals Patient Stated Goal: agreed to getting up OT Goal Formulation: With patient Time For Goal Achievement: 05/08/14 Potential to Achieve Goals: Good ADL Goals Pt Will Perform Grooming: with supervision;standing Pt Will Transfer to Toilet: with min guard assist;ambulating;bedside commode Pt Will Perform Toileting - Clothing Manipulation and hygiene: with min guard assist;sit to/from stand  OT Frequency: Min 2X/week   Barriers to D/C:            Co-evaluation PT/OT/SLP Co-Evaluation/Treatment: Yes Reason for Co-Treatment: For patient/therapist safety PT goals addressed during session: Mobility/safety with mobility OT goals addressed during session: ADL's and self-care      End of Session    Activity Tolerance: Patient limited by pain Patient left: in chair;with call bell/phone within reach;with chair alarm set   Time: 4496-7591 OT Time Calculation (min): 38 min Charges:  OT General Charges $OT Visit: 1 Procedure OT Evaluation $Initial OT Evaluation Tier I: 1 Procedure OT Treatments $Self Care/Home Management : 8-22 mins G-Codes:    Kaylah Chiasson May 05, 2014, 4:10 PM  Lesle Chris,  OTR/L [phone] May 05, 2014

## 2014-04-25 ENCOUNTER — Encounter (HOSPITAL_COMMUNITY): Payer: Self-pay | Admitting: *Deleted

## 2014-04-25 ENCOUNTER — Inpatient Hospital Stay (HOSPITAL_COMMUNITY): Payer: Medicare Other

## 2014-04-25 ENCOUNTER — Ambulatory Visit: Payer: Medicare Other | Admitting: Neurology

## 2014-04-25 DIAGNOSIS — N183 Chronic kidney disease, stage 3 (moderate): Secondary | ICD-10-CM

## 2014-04-25 LAB — COMPREHENSIVE METABOLIC PANEL
ALBUMIN: 3.1 g/dL — AB (ref 3.5–5.2)
ALT: 12 U/L (ref 0–35)
ANION GAP: 14 (ref 5–15)
AST: 21 U/L (ref 0–37)
Alkaline Phosphatase: 66 U/L (ref 39–117)
BILIRUBIN TOTAL: 0.7 mg/dL (ref 0.3–1.2)
BUN: 26 mg/dL — ABNORMAL HIGH (ref 6–23)
CO2: 23 mEq/L (ref 19–32)
Calcium: 9 mg/dL (ref 8.4–10.5)
Chloride: 101 mEq/L (ref 96–112)
Creatinine, Ser: 1.5 mg/dL — ABNORMAL HIGH (ref 0.50–1.10)
GFR calc Af Amer: 34 mL/min — ABNORMAL LOW (ref >90)
GFR calc non Af Amer: 29 mL/min — ABNORMAL LOW (ref >90)
Glucose, Bld: 112 mg/dL — ABNORMAL HIGH (ref 70–99)
Potassium: 3.8 mEq/L (ref 3.7–5.3)
SODIUM: 138 meq/L (ref 137–147)
TOTAL PROTEIN: 6.5 g/dL (ref 6.0–8.3)

## 2014-04-25 LAB — CBC
HEMATOCRIT: 35.7 % — AB (ref 36.0–46.0)
HEMOGLOBIN: 11.9 g/dL — AB (ref 12.0–15.0)
MCH: 30.9 pg (ref 26.0–34.0)
MCHC: 33.3 g/dL (ref 30.0–36.0)
MCV: 92.7 fL (ref 78.0–100.0)
Platelets: 108 10*3/uL — ABNORMAL LOW (ref 150–400)
RBC: 3.85 MIL/uL — ABNORMAL LOW (ref 3.87–5.11)
RDW: 13 % (ref 11.5–15.5)
WBC: 13.3 10*3/uL — ABNORMAL HIGH (ref 4.0–10.5)

## 2014-04-25 LAB — URINE CULTURE
Colony Count: 60000
SPECIAL REQUESTS: NORMAL

## 2014-04-25 LAB — SEDIMENTATION RATE: Sed Rate: 32 mm/hr — ABNORMAL HIGH (ref 0–22)

## 2014-04-25 MED ORDER — METOPROLOL TARTRATE 1 MG/ML IV SOLN
INTRAVENOUS | Status: AC
  Filled 2014-04-25: qty 5

## 2014-04-25 MED ORDER — ISOSORB DINITRATE-HYDRALAZINE 20-37.5 MG PO TABS
1.0000 | ORAL_TABLET | Freq: Three times a day (TID) | ORAL | Status: DC
  Administered 2014-04-25 (×3): 1 via ORAL
  Filled 2014-04-25 (×6): qty 1

## 2014-04-25 MED ORDER — METOPROLOL TARTRATE 1 MG/ML IV SOLN
5.0000 mg | Freq: Once | INTRAVENOUS | Status: AC
  Administered 2014-04-25: 5 mg via INTRAVENOUS

## 2014-04-25 NOTE — Progress Notes (Addendum)
Clinical Social Work Department CLINICAL SOCIAL WORK PLACEMENT NOTE 04/25/2014  Patient:  [name], [name]  Account Number:  [id number] Admit date:  04/23/2014  Clinical Social Worker:  Ulyess Blossom  Date/time:  04/25/2014 04:54 PM  Clinical Social Work is seeking post-discharge placement for this patient at the following level of care:   SKILLED NURSING   (*CSW will update this form in Epic as items are completed)   04/25/2014  Patient/family provided with [place] Department of Clinical Social Work's list of facilities offering this level of care within the geographic area requested by the patient (or if unable, by the patient's family).  04/25/2014  Patient/family informed of their freedom to choose among providers that offer the needed level of care, that participate in Medicare, Medicaid or managed care program needed by the patient, have an available bed and are willing to accept the patient.  04/25/2014  Patient/family informed of [facility]' ownership interest in [facility], as well as of the fact that they are under no obligation to receive care at this facility.  PASARR submitted to EDS on 04/25/2014 PASARR number received on 04/25/2014  FL2 transmitted to all facilities in geographic area requested by pt/family on  04/25/2014 FL2 transmitted to all facilities within larger geographic area on   Patient informed that his/her managed care company has contracts with or will negotiate with  certain facilities, including the following:     Patient/family informed of bed offers received:  04/25/2014 Patient chooses bed at [facility] Physician recommends and patient chooses bed at    Patient to be transferred to  on  [place] on 04/27/2014 Patient to be transferred to facility by ambulance Corey Harold) Patient and family notified of transfer on 04/27/2014 Name of family member notified:  Pt notified at bedside, pt  daughter, [name] notified via telephone  The following physician request were entered in Epic:   Additional Comments:   Alison Murray, MSW, [place] Work [phone]

## 2014-04-25 NOTE — Progress Notes (Signed)
Clinical Social Work Department BRIEF PSYCHOSOCIAL ASSESSMENT 04/25/2014  Patient:  Caroline Underwood, FITTING     Account Number:  [id number]     Admit date:  04/23/2014  Clinical Social Worker:  Ulyess Blossom  Date/Time:  04/25/2014 12:00 N  Referred by:  Physician  Date Referred:  04/25/2014 Referred for  SNF Placement   Other Referral:   Interview type:  Family Other interview type:    PSYCHOSOCIAL DATA Living Status:  FAMILY Admitted from facility:   Level of care:   Primary support name:  Robin Schrecengost/daughter/[phone] Primary support relationship to patient:  CHILD, ADULT Degree of support available:   strong    CURRENT CONCERNS Current Concerns  Post-Acute Placement   Other Concerns:    SOCIAL WORK ASSESSMENT / PLAN CSW received referral for New SNF.    CSW received return phone call from pt daughte, [name]. CSW introduced self and explained role. Pt daughter discussed that pt daughter lives with pt and pt son also resides with them. Pt daughter shared that pt son is with pt 24 hours a day, but does not provide personal care to pt and would be unable to assist pt with ambulating as he has his own limitation with mobility. CSW discussed recommendation for ST SNF placement. Pt daughter agreeable and interested in [place] or [place].    CSW completed FL2 and initiated SNF search.    CSW provided pt daugher bed offers. Pt daughter agreeable to [facility] and [facility].    CSW contacted [facility] and confirmed facility could accept pt upon discharge.    CSW initiated [employer] authorization and awaiting authorization.    CSW to continue to follow to assist with pt disposition needs.   Assessment/plan status:  Psychosocial Support/Ongoing Assessment of Needs Other assessment/ plan:   discharge planning   Information/referral to community resources:   [facility] list    PATIENT'S/FAMILY'S RESPONSE TO PLAN OF  CARE: Pt oriented to person only secondary to dementia. Pt daughter supportive, but has a difficult work schedule and cannot be with pt 24 hours a day and pt son unable to provide the personal care that pt would need at home. Pt daughter feels ST SNF will be beneficial to pt and agreeable to [facility].   Alison Murray, MSW, [place] Work [phone]

## 2014-04-25 NOTE — Progress Notes (Signed)
[facility]level stated to give pt metoprolol iv for hr and observe pt status.

## 2014-04-25 NOTE — Progress Notes (Signed)
[facility] Progress Note Patient Name: Caroline Underwood DOB: 1921/07/23 MRN: [id number]   Date of Service  04/25/2014  HPI/Events of Note  Afib with RVR BP stable Comfortable per camera view  eICU Interventions  12 lead Asked RN to call [facility] cross cover     Intervention Category Intermediate Interventions: Arrhythmia - evaluation and management  [name] 04/25/2014, 10:25 PM

## 2014-04-25 NOTE — Progress Notes (Signed)
Pt with HR of 140's. B/p 170's. Pt w/o any s/s of distress. Pt has hx of afib. [facility]level called awaiting call back.

## 2014-04-25 NOTE — Progress Notes (Signed)
Name: Caroline Underwood MRN: [id number] DOB: 18-May-1922    ADMISSION DATE:  04/23/2014 CONSULTATION DATE:  04/23/14  REFERRING MD :  Dr. Sheran Fava   CHIEF COMPLAINT:  Pneumothorax   BRIEF PATIENT DESCRIPTION: 80 y/o F admitted per [facility] for lower abd/hip pain.  CT Abd noted an incidental finding large L pneumothorax.  PCCM consulted for evaluation of PTX.    SIGNIFICANT EVENTS  10/7  Admit for hip pain, plain films neg for hip fx, DJD.  CT abd performed to r/o RP bleed and incidental finding of L PTX  STUDIES:  10/7  CXR > L PTX, emphysema  10/7  Pelvis XR >> irregular L femoral head/neck, ? DJD vs subtle acute non-displaced subcapital fx 10/7  L hip complete XR > degenerative changes, no acute fx 10/8: 10/8 bedside thoracentesis for air aspiration   SUBJECTIVE:  No distress. Comfortable   VITAL SIGNS: Temp:  [97.5 F (36.4 C)-98.6 F (37 C)] 98.1 F (36.7 C) (10/09 0800) Pulse Rate:  [57-87] 71 (10/09 0800) Resp:  [11-22] 15 (10/09 0800) BP: (82-189)/(31-93) 120/36 mmHg (10/09 0800) SpO2:  [95 %-98 %] 97 % (10/09 0800) Weight:  [56.2 kg (123 lb 14.4 oz)] 56.2 kg (123 lb 14.4 oz) (10/09 0500) Room air  PHYSICAL EXAMINATION: General:  wdwn elderly female in NAD  Neuro:  Awake / alert, appropriate in conversation, MAE. Confused at times  HEENT:  Mm pin/moist, poor dentition  Cardiovascular:  s1s2 rrr, no m/r/g  Lungs:  resp's even/non-labored, clear  Abdomen:  Round/soft, bsx4 active Musculoskeletal:  No acute deformities  Skin:  Warm/dry, no edema    Recent Labs Lab 04/23/14 0219 04/24/14 0328 04/25/14 0335  NA 139 137 138  K 3.8 3.6* 3.8  CL 99 99 101  CO2 25 25 23   BUN 24* 21 26*  CREATININE 1.50* 1.34* 1.50*  GLUCOSE 163* 143* 112*    Recent Labs Lab 04/23/14 0219 04/24/14 0328 04/25/14 0335  HGB 13.3 12.3 11.9*  HCT 37.7 36.9 35.7*  WBC 18.0* 14.6* 13.3*  PLT 176 124* 108*   Dg Chest Port 1 View  04/25/2014   CLINICAL DATA:  Follow-up of left-sided  pneumothorax; history of COPD, breast malignancy, and atrial flutter  EXAM: PORTABLE CHEST - 1 VIEW  COMPARISON:  Portable chest x-ray of April 24, 2014  FINDINGS: There is a tiny pneumothorax remaining at the left lung base. No apical pneumothorax is demonstrated. Both lungs are hyperinflated. The interstitial markings are minimally prominent though stable. There is no alveolar pneumonia. The heart and pulmonary vascularity are normal. The trachea is midline. There is no significant pleural effusion. The bony thorax is unremarkable.  IMPRESSION: COPD. There is a tiny residual pneumothorax at the left lung base laterally.   Electronically Signed   By: David  Martinique   On: 04/25/2014 07:49   Dg Chest Port 1 View  04/24/2014   CLINICAL DATA:  Follow-up pneumothorax  EXAM: PORTABLE CHEST - 1 VIEW  COMPARISON:  04/24/2014  FINDINGS: Cardiomediastinal silhouette is stable. There is a small residual left upper pneumothorax. No acute infiltrate or pulmonary edema. Tiny residual left lower lateral pneumothorax.  IMPRESSION: Small residual left upper pneumothorax. Tiny residual left lower lateral pneumothorax.   Electronically Signed   By: Lahoma Crocker M.D.   On: 04/24/2014 15:46   Dg Chest Port 1 View  04/24/2014   CLINICAL DATA:  History breast cancer.  Hypertension.  EXAM: PORTABLE CHEST - 1 VIEW  COMPARISON:  04/24/2014.  FINDINGS:  Mediastinum hilar structures are normal the lungs are clear. Heart size normal. Mild left base subsegmental atelectasis and tiny left pleural effusion. Scratch Previously identified pneumothorax has resolved. No acute new bony abnormalities.  IMPRESSION: Interim resolution of the left-sided pneumothorax. Mild left base subsegmental atelectasis and tiny left pleural effusion noted.   Electronically Signed   By: Marcello Moores  Register   On: 04/24/2014 11:02   Dg Chest Port 1 View  04/24/2014   CLINICAL DATA:  80 year old female with known pneumothorax.  EXAM: PORTABLE CHEST - 1 VIEW   COMPARISON:  Chest x-ray 04/23/2014.  FINDINGS: Again noted is a moderate left-sided pneumothorax (approximately 20% volume of the left hemithorax). This appears slightly decreased in size compared to yesterday's examination. No chest tube is identified at this time. Right lung is well-expanded, without pneumothorax or consolidative airspace disease. No pleural effusions. No evidence of pulmonary edema. Heart size is normal. Mediastinal contours are unremarkable. Atherosclerosis in the thoracic aorta.  IMPRESSION: 1. Moderate left-sided pneumothorax appears slightly decreased in size compared to yesterday's examination, estimated to occupy approximately 20% of the volume of the left hemithorax the present time. 2. No mediastinal shift or other findings to suggest tension pneumothorax. 3. Atherosclerosis.   Electronically Signed   By: Vinnie Langton M.D.   On: 04/24/2014 07:21   Dg Chest Port 1 View  04/23/2014   CLINICAL DATA:  80 year old female with left pneumothorax discovered on CT Abdomen and Pelvis at 0856 hrs. Initial encounter.  EXAM: PORTABLE CHEST - 1 VIEW  COMPARISON:  CT Abdomen and Pelvis from today reported separately. Chest radiographs 0156 hrs.  FINDINGS: Moderate size left pneumothorax, stable since 0156 hrs. Pleural edge visible laterally to the lung base. Mild associated atelectasis. No mediastinal shift. Normal cardiac size and mediastinal contours. Visualized tracheal air column is within normal limits. No confluent pulmonary opacity elsewhere.  IMPRESSION: Moderate size left pneumothorax, no mediastinal shift or other acute cardiopulmonary abnormality.   Electronically Signed   By: Lars Pinks M.D.   On: 04/23/2014 16:28  very small left base ptx    ASSESSMENT / PLAN:  Left Pneumothorax - s/p air aspiration 10/8. Only minimal residual PTX remains in Left Base  Atypical Pleural Thickening & Nodularity  Emphysema  Former Tobacco Abuse   Plan:  F/u CXR in am, if remains stable can  resume anticoagulation  Oxygen to support sats 90-94%   AFlutter - on Xarelto (being held in anticipation of CT)  (see above)  Delirium  Family states often occurs when hospitalized.  Plan Supportive care   CXR stable. If stable on 10/10 we can resume xarelto.   Marni Griffon ACNP-BC [place] Pager # [phone] OR # [phone] if no answer   Attending:  I have seen and examined the patient with nurse practitioner/resident and agree with the note above.   Doing well.  Stable. CXR reviewed, tiny residual pneumothorax left which is not unexpected.    Still haven't been able to talk to daughter despite three attempts  Check an AP Chest film (portable) tomorrow morning.  Let us know if the pneumothorax gets bigger.  Otherwise we will not plan to see her this weekend unless needed.  Roselie Awkward, MD [place] [facility] Pager: [phone] Cell: [phone] If no response, call [phone]   04/25/2014, 9:45 AM

## 2014-04-25 NOTE — Progress Notes (Signed)
ekg done and midlevel informed.

## 2014-04-25 NOTE — Progress Notes (Addendum)
CSW received referral for New SNF.   CSW visited pt room and no family present at bedside. Per chart, pt oriented to person only.  CSW contacted pt daughter, [name] via telephone and left voice message.  CSW awaiting return phone call from pt daughter in order to complete full psychosocial assessment.  CSW to continue to follow.  Addendum 11:07 am:  CSW checked with RN to inquire if RN had additional contacts for pt. Per RN, pt daughter called this morning to check on pt and reported that she would be at work and can not have her phone while working. CSW asked RN to notify pt daughter to contact CSW if she calls again for an update on pt. CSW hopeful pt daughter will return CSW phone call once she is able to check her voice messages.   CSW to continue to follow.  Alison Murray, MSW, [place] [facility] [phone]

## 2014-04-25 NOTE — Progress Notes (Signed)
[facility] PROGRESS NOTE  Caroline Underwood MVH:[id number] DOB: 10/31/1921 DOA: 04/23/2014 PCP: Lucretia Kern., DO  Assessment/Plan  Acute hypoxic respiratory failure secondary to moderate to large left pneumothorax s/p aspiration by thoracentesis on 10/8 by PCCM - Appreciate pulmonary assistance  - PTX appears mostly resolved this AM, official interpretation still pending - continue oxygen via nasal canula to keep O2 sat > 92%  Leukocytosis, patient afebrile and WBC trending down - CXR and UA unremarkable  -  UCx growing mixed flora from clean catch - likely contaminate - Hold abx for now as she does not meet criteria for SIRS/sepsis   Pelvic pain/hip pain without evidence of fracture.  Consider PMR - XR left hip negative for acute fracture - CT abd/pelvis without contrast:  No evidence of retroperitoneal bleed or other explanation for symptoms - PT/OT recommending SNF - Tylenol and hydrocodone prn pain  - ESR 21  COPD, stable. Continue LABA/ICS   Atrial flutter  - ECG NSR with inverted T-waves in V4-V6 - Hold a/c for now  - Continue metoprolol   HTN, blood pressure still elevated  - Continue norvasc and metoprolol -  Change hydralazine to bidil -  Continue prn IV hydralazine  HLD, stable and CK wnl.  LFTs wnl -  Resume statin  CKD stage 3/4, baseline creatinine 1.6-.1.9, currently 1.34 - Renally dose medications  - Minimize nephrotoxins  - continue lasix   Hypothyroidism, stable, monitored by PCP last month. Continue levothyroxine   Dementia, stable, on aricept   Mild normocytic anemia, repeat as outpatient in a few weeks  Mild thrombocytopenia, may be secondary to marrow suppression from acute illness, continuing to trend down. No signs of active bleeing. -  Trend plt  Mild hypokalemi, resolved with supplementation  Diet: regular  Access: PIV  IVF: Off  Proph: SCDs  Code Status: DNR - patient states she has not discussed yet with family  Family  Communication: patient alone  Disposition Plan:  Discharge to SNF once oxygen weaned, SW consult placed  Consultants:  Pulmonology, Dr. Lake Bells  Procedures:  CXR  XR pelvis:  Possible left subcapital fracture  XR left hip:  No evidence of fracture  CT ab/p without contrast:  Moderate to large left PTX   Antibiotics:  none   HPI/Subjective:  Denies pleuritic chest pain, chest tightness, but has some mild SOB today.  Hips are less sore.   Objective: Filed Vitals:   04/25/14 0337 04/25/14 0400 04/25/14 0500 04/25/14 0700  BP:  162/43  166/47  Pulse:  83  87  Temp: 98.6 F (37 C)     TempSrc: Oral     Resp:  17  14  Height:      Weight:   56.2 kg (123 lb 14.4 oz)   SpO2:  96%  96%    Intake/Output Summary (Last 24 hours) at 04/25/14 0728 Last data filed at 04/25/14 0537  Gross per 24 hour  Intake    210 ml  Output    500 ml  Net   -290 ml   Filed Weights   04/23/14 1250 04/24/14 0353 04/25/14 0500  Weight: 56.1 kg (123 lb 10.9 oz) 56.8 kg (125 lb 3.5 oz) 56.2 kg (123 lb 14.4 oz)    Exam:   General:  WF, No acute distress  HEENT:  NCAT, MMM  Cardiovascular:  IRRR, nl S1, S2 no mrg, 2+ pulses, warm extremities  Respiratory:  Improved breath sounds left anterior chest, no focal rales or rhonchi  or wheeze anteriorly, no increased WOB  Abdomen:   NABS, soft, ND, NT   MSK:   Normal tone and bulk, no LEE.  Able to move toes, ankles, and knees.  Less pain with passive hip flexion of both hips today  Neuro:  Grossly intact  Data Reviewed: Basic Metabolic Panel:  Recent Labs Lab 04/23/14 0219 04/24/14 0328 04/25/14 0335  NA 139 137 138  K 3.8 3.6* 3.8  CL 99 99 101  CO2 _0 GLUCOSE 163* 143* 112*  BUN 24* 21 26*  CREATININE 1.50* 1.34* 1.50*  CALCIUM 9.3 9.1 9.0   Liver Function Tests:  Recent Labs Lab 04/23/14 1020 04/24/14 0328 04/25/14 0335  AST _1 ALT _2 ALKPHOS 79 75 66  BILITOT 0.8 0.7 0.7  PROT 6.8 6.7 6.5   ALBUMIN 3.5 3.4* 3.1*    Recent Labs Lab 04/23/14 1020  LIPASE 33   No results found for this basename: AMMONIA,  in the last 168 hours CBC:  Recent Labs Lab 04/23/14 0219 04/24/14 0328 04/25/14 0335  WBC 18.0* 14.6* 13.3*  NEUTROABS 15.4*  --   --   HGB 13.3 12.3 11.9*  HCT 37.7 36.9 35.7*  MCV 90.2 92.0 92.7  PLT 176 124* 108*   Cardiac Enzymes:  Recent Labs Lab 04/23/14 0613 04/23/14 1020 04/23/14 1511 04/23/14 2114  CKTOTAL 67  --   --   --   TROPONINI  --  <0.30 <0.30 <0.30   BNP (last 3 results) No results found for this basename: PROBNP,  in the last 8760 hours CBG: No results found for this basename: GLUCAP,  in the last 168 hours  Recent Results (from the past 240 hour(s))  URINE CULTURE     Status: None   Collection Time    04/23/14  2:47 AM      Result Value Ref Range Status   Specimen Description URINE, CLEAN CATCH   Final   Special Requests Normal   Final   Culture  Setup Time     Final   Value: 04/23/2014 10:45     Performed at [facility]     Final   Value: 60,000 COLONIES/ML     Performed at [facility]   Culture     Final   Value: ESCHERICHIA COLI     LACTOBACILLUS SPECIES     Note: Standardized susceptibility testing for this organism is not available.     Performed at [facility]   Report Status PENDING   Incomplete  CULTURE, BLOOD (ROUTINE X 2)     Status: None   Collection Time    04/23/14  4:43 AM      Result Value Ref Range Status   Specimen Description BLOOD LEFT ANTECUBITAL   Final   Special Requests BOTTLES DRAWN AEROBIC AND ANAEROBIC 5CC   Final   Culture  Setup Time     Final   Value: 04/23/2014 10:11     Performed at [facility]   Culture     Final   Value:        BLOOD CULTURE RECEIVED NO GROWTH TO DATE CULTURE WILL BE HELD FOR 5 DAYS BEFORE ISSUING A FINAL NEGATIVE REPORT     Performed at [facility]   Report Status PENDING   Incomplete  CULTURE, BLOOD  (ROUTINE X 2)     Status: None   Collection Time  04/23/14  4:45 AM      Result Value Ref Range Status   Specimen Description BLOOD RIGHT HAND   Final   Special Requests BOTTLES DRAWN AEROBIC AND ANAEROBIC 5CC   Final   Culture  Setup Time     Final   Value: 04/23/2014 10:11     Performed at [facility]   Culture     Final   Value:        BLOOD CULTURE RECEIVED NO GROWTH TO DATE CULTURE WILL BE HELD FOR 5 DAYS BEFORE ISSUING A FINAL NEGATIVE REPORT     Performed at [facility]   Report Status PENDING   Incomplete  MRSA PCR SCREENING     Status: None   Collection Time    04/23/14 12:52 PM      Result Value Ref Range Status   MRSA by PCR NEGATIVE  NEGATIVE Final   Comment:            The GeneXpert MRSA Assay (FDA     approved for NASAL specimens     only), is one component of a     comprehensive MRSA colonization     surveillance program. It is not     intended to diagnose MRSA     infection nor to guide or     monitor treatment for     MRSA infections.     Studies: Ct Abdomen Pelvis Wo Contrast  04/23/2014   CLINICAL DATA:  Evaluate for retroperitoneal hemorrhage ; bilateral groin aching also chills ; history of previous abdominal aortic aneurysm repair, COPD, common chronic renal insufficiency  EXAM: CT ABDOMEN AND PELVIS WITHOUT CONTRAST  TECHNIQUE: Multidetector CT imaging of the abdomen and pelvis was performed following the standard protocol without IV contrast.  COMPARISON:  PA and lateral chest x-ray of April 23, 2014 and pelvis series of the same day  FINDINGS: There is a large left-sided pneumothorax. There is a small amount of pleural fluid layering posteriorly on the left.  There is an aorto bi-iliac stent graft in place. The maximal AP diameter of the stented abdominal aorta is 3.9 cm which is stable. There is no periaortic hemorrhage. The peri-iliac soft tissues are unremarkable. The psoas musculature is normal. There is no intraperitoneal nor  retroperitoneal hemorrhage.  The liver, gallbladder, pancreas, spleen, adrenal glands, and kidneys exhibit no acute abnormalities. There is chronic right renal atrophy. There is a stable 5.5 cm mid to lower pole left renal cyst r. The small and large bowel exhibit no acute abnormalities. There is diverticulosis. The appendix is normal. The urinary bladder is normal. The uterus is small appropriate for age. There are no adnexal masses. There is no inguinal nor umbilical hernia.  IMPRESSION: 1. There is a large left-sided pneumothorax with small left pleural effusion. 2. The aorto bi-iliac stent graft and surrounding soft tissues are unremarkable. 3. There is no intraperitoneal nor retroperitoneal hemorrhage 4. There is chronic right renal atrophy. 5. Critical Value/emergent results were called by telephone at the time of interpretation on 04/23/2014 at 9:22 am to Dr. Dina Rich, who verbally acknowledged these results.   Electronically Signed   By: David  Martinique   On: 04/23/2014 09:38   Dg Hip Complete Left  04/23/2014   CLINICAL DATA:  Atraumatic left groin pain ; history of dementia; limited history available  EXAM: LEFT HIP - COMPLETE 2+ VIEW  COMPARISON:  AP pelvis of April 23, 2014  FINDINGS: The bony pelvis is adequately mineralized  for age. There is no acute fracture. There is degenerative change of the hip joints greater on the left than on the right. No acute hip fracture or dislocation is demonstrated. The overlying soft tissues are unremarkable. There is no evidence of gas within a left inguinal hernia. There is an aorto bi-iliac stent graft in place.  IMPRESSION: There are degenerative changes of the left hip but there is no acute fracture nor dislocation. No acute abnormality elsewhere is demonstrated.   Electronically Signed   By: David  Martinique   On: 04/23/2014 08:36   Dg Chest Port 1 View  04/24/2014   CLINICAL DATA:  Follow-up pneumothorax  EXAM: PORTABLE CHEST - 1 VIEW  COMPARISON:  04/24/2014   FINDINGS: Cardiomediastinal silhouette is stable. There is a small residual left upper pneumothorax. No acute infiltrate or pulmonary edema. Tiny residual left lower lateral pneumothorax.  IMPRESSION: Small residual left upper pneumothorax. Tiny residual left lower lateral pneumothorax.   Electronically Signed   By: Lahoma Crocker M.D.   On: 04/24/2014 15:46   Dg Chest Port 1 View  04/24/2014   CLINICAL DATA:  History breast cancer.  Hypertension.  EXAM: PORTABLE CHEST - 1 VIEW  COMPARISON:  04/24/2014.  FINDINGS: Mediastinum hilar structures are normal the lungs are clear. Heart size normal. Mild left base subsegmental atelectasis and tiny left pleural effusion. Scratch Previously identified pneumothorax has resolved. No acute new bony abnormalities.  IMPRESSION: Interim resolution of the left-sided pneumothorax. Mild left base subsegmental atelectasis and tiny left pleural effusion noted.   Electronically Signed   By: Marcello Moores  Register   On: 04/24/2014 11:02   Dg Chest Port 1 View  04/24/2014   CLINICAL DATA:  80 year old female with known pneumothorax.  EXAM: PORTABLE CHEST - 1 VIEW  COMPARISON:  Chest x-ray 04/23/2014.  FINDINGS: Again noted is a moderate left-sided pneumothorax (approximately 20% volume of the left hemithorax). This appears slightly decreased in size compared to yesterday's examination. No chest tube is identified at this time. Right lung is well-expanded, without pneumothorax or consolidative airspace disease. No pleural effusions. No evidence of pulmonary edema. Heart size is normal. Mediastinal contours are unremarkable. Atherosclerosis in the thoracic aorta.  IMPRESSION: 1. Moderate left-sided pneumothorax appears slightly decreased in size compared to yesterday's examination, estimated to occupy approximately 20% of the volume of the left hemithorax the present time. 2. No mediastinal shift or other findings to suggest tension pneumothorax. 3. Atherosclerosis.   Electronically Signed    By: Vinnie Langton M.D.   On: 04/24/2014 07:21   Dg Chest Port 1 View  04/23/2014   CLINICAL DATA:  80 year old female with left pneumothorax discovered on CT Abdomen and Pelvis at 0856 hrs. Initial encounter.  EXAM: PORTABLE CHEST - 1 VIEW  COMPARISON:  CT Abdomen and Pelvis from today reported separately. Chest radiographs 0156 hrs.  FINDINGS: Moderate size left pneumothorax, stable since 0156 hrs. Pleural edge visible laterally to the lung base. Mild associated atelectasis. No mediastinal shift. Normal cardiac size and mediastinal contours. Visualized tracheal air column is within normal limits. No confluent pulmonary opacity elsewhere.  IMPRESSION: Moderate size left pneumothorax, no mediastinal shift or other acute cardiopulmonary abnormality.   Electronically Signed   By: Lars Pinks M.D.   On: 04/23/2014 16:28    Scheduled Meds: . amLODipine  5 mg Oral Daily  . atorvastatin  20 mg Oral q1800  . [START ON 04/29/2014] cholecalciferol  1,000 Units Oral Weekly  . docusate sodium  100 mg Oral BID  .  donepezil  10 mg Oral QHS  . furosemide  40 mg Oral Daily  . isosorbide-hydrALAZINE  1 tablet Oral TID  . levothyroxine  37.5 mcg Oral QAC breakfast  . metoprolol  50 mg Oral BID  . mometasone-formoterol  2 puff Inhalation BID  . senna  1 tablet Oral BID   Continuous Infusions:   Active Problems:   Atrial fibrillation   Hyperlipemia   Chronic kidney disease   COPD (chronic obstructive pulmonary disease)   Iron deficiency anemia   Abdominal pain, epigastric   Abdominal pain   Pneumothorax    Time spent: 30 min    Tarina Volk, [facility]  [facility] Pager [phone]. If 7PM-7AM, please contact night-coverage at [email or web address], password [facility] 04/25/2014, 7:28 AM  LOS: 2 days

## 2014-04-26 ENCOUNTER — Inpatient Hospital Stay (HOSPITAL_COMMUNITY): Payer: Medicare Other

## 2014-04-26 DIAGNOSIS — G309 Alzheimer's disease, unspecified: Secondary | ICD-10-CM

## 2014-04-26 LAB — CBC
HCT: 32.9 % — ABNORMAL LOW (ref 36.0–46.0)
Hemoglobin: 11.2 g/dL — ABNORMAL LOW (ref 12.0–15.0)
MCH: 31.3 pg (ref 26.0–34.0)
MCHC: 34 g/dL (ref 30.0–36.0)
MCV: 91.9 fL (ref 78.0–100.0)
PLATELETS: 122 10*3/uL — AB (ref 150–400)
RBC: 3.58 MIL/uL — AB (ref 3.87–5.11)
RDW: 12.9 % (ref 11.5–15.5)
WBC: 15.8 10*3/uL — ABNORMAL HIGH (ref 4.0–10.5)

## 2014-04-26 LAB — URINALYSIS, ROUTINE W REFLEX MICROSCOPIC
Bilirubin Urine: NEGATIVE
Glucose, UA: NEGATIVE mg/dL
Hgb urine dipstick: NEGATIVE
KETONES UR: NEGATIVE mg/dL
Leukocytes, UA: NEGATIVE
Nitrite: NEGATIVE
PH: 5.5 (ref 5.0–8.0)
Protein, ur: 100 mg/dL — AB
Specific Gravity, Urine: 1.018 (ref 1.005–1.030)
Urobilinogen, UA: 0.2 mg/dL (ref 0.0–1.0)

## 2014-04-26 LAB — COMPREHENSIVE METABOLIC PANEL
ALT: 12 U/L (ref 0–35)
ANION GAP: 16 — AB (ref 5–15)
AST: 25 U/L (ref 0–37)
Albumin: 3.1 g/dL — ABNORMAL LOW (ref 3.5–5.2)
Alkaline Phosphatase: 64 U/L (ref 39–117)
BUN: 33 mg/dL — AB (ref 6–23)
CALCIUM: 9 mg/dL (ref 8.4–10.5)
CO2: 22 meq/L (ref 19–32)
Chloride: 99 mEq/L (ref 96–112)
Creatinine, Ser: 1.61 mg/dL — ABNORMAL HIGH (ref 0.50–1.10)
GFR, EST AFRICAN AMERICAN: 31 mL/min — AB (ref >90)
GFR, EST NON AFRICAN AMERICAN: 27 mL/min — AB (ref >90)
GLUCOSE: 135 mg/dL — AB (ref 70–99)
Potassium: 3.7 mEq/L (ref 3.7–5.3)
SODIUM: 137 meq/L (ref 137–147)
Total Bilirubin: 0.9 mg/dL (ref 0.3–1.2)
Total Protein: 6.4 g/dL (ref 6.0–8.3)

## 2014-04-26 LAB — URINE MICROSCOPIC-ADD ON

## 2014-04-26 MED ORDER — METOPROLOL TARTRATE 50 MG PO TABS
100.0000 mg | ORAL_TABLET | Freq: Two times a day (BID) | ORAL | Status: DC
  Administered 2014-04-26 – 2014-04-27 (×2): 100 mg via ORAL
  Filled 2014-04-26 (×2): qty 2

## 2014-04-26 MED ORDER — METOPROLOL TARTRATE 1 MG/ML IV SOLN
5.0000 mg | Freq: Four times a day (QID) | INTRAVENOUS | Status: DC | PRN
  Administered 2014-04-26: 5 mg via INTRAVENOUS
  Filled 2014-04-26: qty 5

## 2014-04-26 MED ORDER — METOPROLOL TARTRATE 25 MG PO TABS: 100.0000 mg | ORAL_TABLET | Freq: Two times a day (BID) | ORAL | Status: DC

## 2014-04-26 MED ORDER — METOPROLOL TARTRATE 25 MG PO TABS
75.0000 mg | ORAL_TABLET | Freq: Two times a day (BID) | ORAL | Status: DC
Start: 2014-04-26 — End: 2014-04-26

## 2014-04-26 MED ORDER — ENSURE COMPLETE PO LIQD
237.0000 mL | Freq: Two times a day (BID) | ORAL | Status: DC
  Administered 2014-04-26 – 2014-04-27 (×3): 237 mL via ORAL

## 2014-04-26 NOTE — Progress Notes (Addendum)
[facility] PROGRESS NOTE  Caroline Underwood WER:[id number] DOB: Jul 25, 1921 DOA: 04/23/2014 PCP: [name], DO  Assessment/Plan  Acute hypoxic respiratory failure secondary to moderate to large left pneumothorax s/p aspiration by thoracentesis on 10/8 by PCCM - Appreciate pulmonary assistance  - PTX appears mostly resolved over last two days - stable on room air  Leukocytosis, patient afebrile, but WBC rising -  CXR negative for infiltrate -  Repeat UA and urine culture via I/O cath  Pelvic pain/hip pain without evidence of fracture.  - XR left hip negative for acute fracture - CT abd/pelvis without contrast:  No evidence of retroperitoneal bleed or other explanation for symptoms - PT/OT recommending SNF - Tylenol and hydrocodone prn pain  - ESR 21, CK 67  COPD, stable. Continue LABA/ICS   Paroxysmal atrial flutter, previously in sinus rhythm but converted to a-fib with HR in 130s last night, asymptomatic - Hold a/c due to falls risk - Increase metoprolol to 111m PO BID  HTN, blood pressure labile, systolics elevated, diastolic low -  Continue norvasc -  Increase metoprolol -  Stop bidil/hydralazine which may be exacerbating her tachycardia -  D/c prn IV hydralazine   HLD, stable and CK wnl.  LFTs wnl -  Cont statin  CKD stage 3/4, baseline creatinine 1.6-.1.9, currently 1.34.  BUN and creatinine rising (not eating much) - Renally dose medications  - Minimize nephrotoxins  - Hold lasix   Hypothyroidism, stable, monitored by PCP last month. Continue levothyroxine   Dementia, stable with sundowning, on aricept   Mild normocytic anemia, stable hgb, repeat as outpatient in a few weeks  Mild thrombocytopenia, may be secondary to marrow suppression from acute illness, improved  Mild hypokalemia, resolved with supplementation  Malnutrition, likely moderate.  Only eating about 2041mdaily over last few days -  Start supplements -  Regular diet -  Nutrition  consultation  Diet: regular  Access: PIV  IVF: Off  Proph: SCDs  Code Status: DNR - patient states she has not discussed yet with family  Family Communication: patient and her daughter Disposition Plan:  Continue telemetry monitoring, transfer from stepdown  Consultants:  Pulmonology, Dr. McLake BellsProcedures:  CXR  XR pelvis:  Possible left subcapital fracture  XR left hip:  No evidence of fracture  CT ab/p without contrast:  Moderate to large left PTX   Antibiotics:  none   HPI/Subjective:  Denies SOB.  Feeling well except for quad pain.  Ready to eat breakfast.   Objective: Filed Vitals:   04/25/14 2352 04/26/14 0100 04/26/14 0300 04/26/14 0406  BP:    164/56  Pulse:  105  89  Temp: 97.9 F (36.6 C)  98 F (36.7 C)   TempSrc: Oral  Oral   Resp:  19  21  Height:      Weight:   56.5 kg (124 lb 9 oz)   SpO2:  92%  94%    Intake/Output Summary (Last 24 hours) at 04/26/14 0756 Last data filed at 04/25/14 2000  Gross per 24 hour  Intake    210 ml  Output    575 ml  Net   -365 ml   Filed Weights   04/24/14 0353 04/25/14 0500 04/26/14 0300  Weight: 56.8 kg (125 lb 3.5 oz) 56.2 kg (123 lb 14.4 oz) 56.5 kg (124 lb 9 oz)    Exam:   General:  WF, No acute distress, lying in bed  HEENT:  NCAT, MMM  Cardiovascular:  IRRR, nl  S1, S2 no mrg, 2+ pulses, warm extremities  Respiratory:  Equal BS bilaterally, no focal rales or rhonchi or wheeze anteriorly, no increased WOB  Abdomen:   NABS, soft, ND, NT   MSK:   Normal tone and bulk, no LEE.  Able to move toes, ankles, and knees.  No anterior hip pain today with passive movement of lower extremities but c/o quad pain with movement.    Neuro:  Grossly intact  Data Reviewed: Basic Metabolic Panel:  Recent Labs Lab 04/23/14 0219 04/24/14 0328 04/25/14 0335 04/26/14 0343  NA 139 137 138 137  K 3.8 3.6* 3.8 3.7  CL 99 99 101 99  CO2 _0 GLUCOSE 163* 143* 112* 135*  BUN 24* 21 26* 33*   CREATININE 1.50* 1.34* 1.50* 1.61*  CALCIUM 9.3 9.1 9.0 9.0   Liver Function Tests:  Recent Labs Lab 04/23/14 1020 04/24/14 0328 04/25/14 0335 04/26/14 0343  AST _1 ALT _2 ALKPHOS 79 75 66 64  BILITOT 0.8 0.7 0.7 0.9  PROT 6.8 6.7 6.5 6.4  ALBUMIN 3.5 3.4* 3.1* 3.1*    Recent Labs Lab 04/23/14 1020  LIPASE 33   No results found for this basename: AMMONIA,  in the last 168 hours CBC:  Recent Labs Lab 04/23/14 0219 04/24/14 0328 04/25/14 0335 04/26/14 0343  WBC 18.0* 14.6* 13.3* 15.8*  NEUTROABS 15.4*  --   --   --   HGB 13.3 12.3 11.9* 11.2*  HCT 37.7 36.9 35.7* 32.9*  MCV 90.2 92.0 92.7 91.9  PLT 176 124* 108* 122*   Cardiac Enzymes:  Recent Labs Lab 04/23/14 0613 04/23/14 1020 04/23/14 1511 04/23/14 2114  CKTOTAL 67  --   --   --   TROPONINI  --  <0.30 <0.30 <0.30   BNP (last 3 results) No results found for this basename: PROBNP,  in the last 8760 hours CBG: No results found for this basename: GLUCAP,  in the last 168 hours  Recent Results (from the past 240 hour(s))  URINE CULTURE     Status: None   Collection Time    04/23/14  2:47 AM      Result Value Ref Range Status   Specimen Description URINE, CLEAN CATCH   Final   Special Requests Normal   Final   Culture  Setup Time     Final   Value: 04/23/2014 10:45     Performed at [facility] [facility]     Final   Value: 60,000 COLONIES/ML     Performed at [facility]   Culture     Final   Value: ESCHERICHIA COLI     LACTOBACILLUS SPECIES     Note: Standardized susceptibility testing for this organism is not available.     Performed at [facility]   Report Status 04/25/2014 FINAL   Final   Organism ID, Bacteria ESCHERICHIA COLI   Final  CULTURE, BLOOD (ROUTINE X 2)     Status: None   Collection Time    04/23/14  4:43 AM      Result Value Ref Range Status   Specimen Description BLOOD LEFT ANTECUBITAL   Final   Special Requests BOTTLES  DRAWN AEROBIC AND ANAEROBIC 5CC   Final   Culture  Setup Time     Final   Value: 04/23/2014 10:11     Performed at [facility]  Final   Value:        BLOOD CULTURE RECEIVED NO GROWTH TO DATE CULTURE WILL BE HELD FOR 5 DAYS BEFORE ISSUING A FINAL NEGATIVE REPORT     Performed at [facility]   Report Status PENDING   Incomplete  CULTURE, BLOOD (ROUTINE X 2)     Status: None   Collection Time    04/23/14  4:45 AM      Result Value Ref Range Status   Specimen Description BLOOD RIGHT HAND   Final   Special Requests BOTTLES DRAWN AEROBIC AND ANAEROBIC 5CC   Final   Culture  Setup Time     Final   Value: 04/23/2014 10:11     Performed at [facility]   Culture     Final   Value:        BLOOD CULTURE RECEIVED NO GROWTH TO DATE CULTURE WILL BE HELD FOR 5 DAYS BEFORE ISSUING A FINAL NEGATIVE REPORT     Performed at [facility]   Report Status PENDING   Incomplete  MRSA PCR SCREENING     Status: None   Collection Time    04/23/14 12:52 PM      Result Value Ref Range Status   MRSA by PCR NEGATIVE  NEGATIVE Final   Comment:            The GeneXpert MRSA Assay (FDA     approved for NASAL specimens     only), is one component of a     comprehensive MRSA colonization     surveillance program. It is not     intended to diagnose MRSA     infection nor to guide or     monitor treatment for     MRSA infections.     Studies: Dg Chest Port 1 View  04/26/2014   CLINICAL DATA:  COPD; recent pneumothorax.  EXAM: PORTABLE CHEST - 1 VIEW  COMPARISON:  April 25, 2014  FINDINGS: There remains a minimal left apical pneumothorax, slightly smaller than 2 days prior essentially stable compared to 1 day prior. Basilar pneumothorax on the left is no longer appreciable. There is underlying emphysema. There is no edema or consolidation. Heart size is normal. Pulmonary vascularity reflects underlying emphysema. Atherosclerotic change is noted in the aorta. No  adenopathy. Bones are osteoporotic.  IMPRESSION: Minimal left apical pneumothorax remains. Underlying emphysema. No edema or consolidation.   Electronically Signed   By: Lowella Grip M.D.   On: 04/26/2014 07:27   Dg Chest Port 1 View  04/25/2014   CLINICAL DATA:  Follow-up of left-sided pneumothorax; history of COPD, breast malignancy, and atrial flutter  EXAM: PORTABLE CHEST - 1 VIEW  COMPARISON:  Portable chest x-ray of April 24, 2014  FINDINGS: There is a tiny pneumothorax remaining at the left lung base. No apical pneumothorax is demonstrated. Both lungs are hyperinflated. The interstitial markings are minimally prominent though stable. There is no alveolar pneumonia. The heart and pulmonary vascularity are normal. The trachea is midline. There is no significant pleural effusion. The bony thorax is unremarkable.  IMPRESSION: COPD. There is a tiny residual pneumothorax at the left lung base laterally.   Electronically Signed   By: David  Martinique   On: 04/25/2014 07:49   Dg Chest Port 1 View  04/24/2014   CLINICAL DATA:  Follow-up pneumothorax  EXAM: PORTABLE CHEST - 1 VIEW  COMPARISON:  04/24/2014  FINDINGS: Cardiomediastinal silhouette is stable. There is a small residual left  upper pneumothorax. No acute infiltrate or pulmonary edema. Tiny residual left lower lateral pneumothorax.  IMPRESSION: Small residual left upper pneumothorax. Tiny residual left lower lateral pneumothorax.   Electronically Signed   By: Lahoma Crocker M.D.   On: 04/24/2014 15:46   Dg Chest Port 1 View  04/24/2014   CLINICAL DATA:  History breast cancer.  Hypertension.  EXAM: PORTABLE CHEST - 1 VIEW  COMPARISON:  04/24/2014.  FINDINGS: Mediastinum hilar structures are normal the lungs are clear. Heart size normal. Mild left base subsegmental atelectasis and tiny left pleural effusion. Scratch Previously identified pneumothorax has resolved. No acute new bony abnormalities.  IMPRESSION: Interim resolution of the left-sided  pneumothorax. Mild left base subsegmental atelectasis and tiny left pleural effusion noted.   Electronically Signed   By: Langley   On: 04/24/2014 11:02    Scheduled Meds: . amLODipine  5 mg Oral Daily  . atorvastatin  20 mg Oral q1800  . [START ON 04/29/2014] cholecalciferol  1,000 Units Oral Weekly  . docusate sodium  100 mg Oral BID  . donepezil  10 mg Oral QHS  . feeding supplement (ENSURE COMPLETE)  237 mL Oral BID BM  . isosorbide-hydrALAZINE  1 tablet Oral TID  . levothyroxine  37.5 mcg Oral QAC breakfast  . metoprolol tartrate  100 mg Oral BID  . mometasone-formoterol  2 puff Inhalation BID  . senna  1 tablet Oral BID   Continuous Infusions:   Active Problems:   Atrial fibrillation   Hyperlipemia   Chronic kidney disease   COPD (chronic obstructive pulmonary disease)   Iron deficiency anemia   Abdominal pain, epigastric   Abdominal pain   Pneumothorax    Time spent: 30 min    Elianna Windom, [facility]  [facility] Pager [phone]. If 7PM-7AM, please contact night-coverage at [email or web address], password [facility] 04/26/2014, 7:56 AM  LOS: 3 days

## 2014-04-26 NOTE — Progress Notes (Signed)
Physical Therapy Treatment Patient Details Name: Caroline Underwood MRN: [id number] DOB: 06/18/1922 Today's Date: 04/26/2014    History of Present Illness 10/7  Admit for hip pain, plain films neg for hip fx, DJD.  CT abd performed to r/o RP bleed and incidental finding of L PTX. post thoracentesis  04/24/14 for resolution of pneumothorax.    PT Comments    Pt progressing  Follow Up Recommendations  SNF;Supervision/Assistance - 24 hour     Equipment Recommendations  None recommended by PT    Recommendations for Other Services       Precautions / Restrictions Precautions Precautions: Fall Precaution Comments: pain with WB L le/ groin    Mobility  Bed Mobility Overal bed mobility: Needs Assistance Bed Mobility: Supine to Sit     Supine to sit: Min guard;Supervision     General bed mobility comments: extra time to mobilize, tactile cues for technique and task completion  Transfers Overall transfer level: Needs assistance Equipment used: Rolling walker (2 wheeled) Transfers: Sit to/from Stand Sit to Stand: Min assist         General transfer comment: multi-modal cues for hand placement and safety  Ambulation/Gait Ambulation/Gait assistance: Min assist Ambulation Distance (Feet): 25 Feet Assistive device: Rolling walker (2 wheeled) Gait Pattern/deviations: Step-through pattern;Decreased stride length;Trunk flexed Gait velocity: pt with 3/4 DOE, after amb, declined to rest and then walk again, too fatigued per pt report   General Gait Details: cues for use of RW, assist with balance intermittently and RW direction   Stairs            Wheelchair Mobility    Modified Rankin (Stroke Patients Only)       Balance           Standing balance support: Bilateral upper extremity supported Standing balance-Leahy Scale: Poor Standing balance comment: improving but continues to requires UE support for balance                    Cognition  Arousal/Alertness: Awake/alert Behavior During Therapy: WFL for tasks assessed/performed Overall Cognitive Status: Impaired/Different from baseline Area of Impairment: Orientation;Safety/judgement;Awareness;Problem solving Orientation Level: Disoriented to;Situation;Place   Memory: Decreased short-term memory   Safety/Judgement: Decreased awareness of safety;Decreased awareness of deficits   Problem Solving: Difficulty sequencing;Requires tactile cues;Requires verbal cues      Exercises      General Comments General comments (skin integrity, edema, etc.): pt oriented to call bell; family informed of chair alarm      Pertinent Vitals/Pain Faces Pain Scale: Hurts little more Pain Location: groin Pain Intervention(s): Repositioned    Home Living                      Prior Function            PT Goals (current goals can now be found in the care plan section) Acute Rehab PT Goals Patient Stated Goal: agreed to getting up PT Goal Formulation: With patient/family Time For Goal Achievement: 05/08/14 Potential to Achieve Goals: Good Progress towards PT goals: Progressing toward goals    Frequency  Min 3X/week    PT Plan Current plan remains appropriate    Co-evaluation             End of Session Equipment Utilized During Treatment: Gait belt Activity Tolerance: Patient limited by fatigue Patient left: in chair;with call bell/phone within reach;with chair alarm set;with family/visitor present     Time: 1510-1520 PT Time Calculation (min): 10 min  Charges:  $Gait Training: 8-22 mins                    G Codes:      Alin Chavira 04/28/14, 3:31 PM

## 2014-04-27 DIAGNOSIS — J9601 Acute respiratory failure with hypoxia: Secondary | ICD-10-CM

## 2014-04-27 DIAGNOSIS — M79605 Pain in left leg: Secondary | ICD-10-CM

## 2014-04-27 DIAGNOSIS — J432 Centrilobular emphysema: Secondary | ICD-10-CM

## 2014-04-27 DIAGNOSIS — E43 Unspecified severe protein-calorie malnutrition: Secondary | ICD-10-CM

## 2014-04-27 DIAGNOSIS — M79604 Pain in right leg: Secondary | ICD-10-CM

## 2014-04-27 LAB — CBC
HCT: 32 % — ABNORMAL LOW (ref 36.0–46.0)
Hemoglobin: 11 g/dL — ABNORMAL LOW (ref 12.0–15.0)
MCH: 31.3 pg (ref 26.0–34.0)
MCHC: 34.4 g/dL (ref 30.0–36.0)
MCV: 91.2 fL (ref 78.0–100.0)
Platelets: 102 10*3/uL — ABNORMAL LOW (ref 150–400)
RBC: 3.51 MIL/uL — ABNORMAL LOW (ref 3.87–5.11)
RDW: 12.8 % (ref 11.5–15.5)
WBC: 12.7 10*3/uL — ABNORMAL HIGH (ref 4.0–10.5)

## 2014-04-27 LAB — BASIC METABOLIC PANEL
Anion gap: 15 (ref 5–15)
BUN: 32 mg/dL — ABNORMAL HIGH (ref 6–23)
CALCIUM: 8.8 mg/dL (ref 8.4–10.5)
CO2: 23 mEq/L (ref 19–32)
Chloride: 103 mEq/L (ref 96–112)
Creatinine, Ser: 1.5 mg/dL — ABNORMAL HIGH (ref 0.50–1.10)
GFR, EST AFRICAN AMERICAN: 34 mL/min — AB (ref >90)
GFR, EST NON AFRICAN AMERICAN: 29 mL/min — AB (ref >90)
GLUCOSE: 112 mg/dL — AB (ref 70–99)
POTASSIUM: 3.7 meq/L (ref 3.7–5.3)
SODIUM: 141 meq/L (ref 137–147)

## 2014-04-27 LAB — URINE CULTURE: Colony Count: 5000

## 2014-04-27 MED ORDER — HYDROCODONE-ACETAMINOPHEN 5-325 MG PO TABS: 1.0000 | ORAL_TABLET | Freq: Four times a day (QID) | ORAL | Status: DC | PRN

## 2014-04-27 MED ORDER — METOPROLOL TARTRATE 100 MG PO TABS: 100.0000 mg | ORAL_TABLET | Freq: Two times a day (BID) | ORAL | Status: DC

## 2014-04-27 MED ORDER — POLYETHYLENE GLYCOL 3350 17 G PO PACK: 17.0000 g | PACK | Freq: Every day | ORAL | Status: DC | PRN

## 2014-04-27 MED ORDER — ENSURE COMPLETE PO LIQD: 237.0000 mL | Freq: Two times a day (BID) | ORAL | Status: DC

## 2014-04-27 MED ORDER — DSS 100 MG PO CAPS: 100.0000 mg | ORAL_CAPSULE | Freq: Two times a day (BID) | ORAL | Status: DC

## 2014-04-27 NOTE — Progress Notes (Signed)
Writer has called and given report to receiving nurse at [facility].

## 2014-04-27 NOTE — Progress Notes (Signed)
INITIAL NUTRITION ASSESSMENT  Pt meets criteria for severe MALNUTRITION in the context of chronic illness as evidenced by depletion of muscle mass and body fat. DOCUMENTATION CODES Per approved criteria  -Severe malnutrition in the context of chronic illness   INTERVENTION: Continue regular diet Continue Ensure Complete po BID, each supplement provides 350 kcal and 13 grams of protein RD to follow  NUTRITION DIAGNOSIS: Inadequate oral intake related to mental status as evidenced by observation.   Goal: Intake of meals and supplements to meet >90% estimated needs  Monitor:  Intake, labs, weight trend  Reason for Assessment: Consult to assess nutritional status and needs.  Poor po intake  80 y.o. female  Admitting Dx: <principal problem not specified>  ASSESSMENT: Patient lives with son and daugther and ambulates with a walker.  Bedbound with poor intake for a few days.  Admitted with acute hypoxic respiratory failure secondary to moderate to large left pneumothorax, leukocytosis, and pelvic/hip pain.  10/11: -Sitting up eating breakfast -Reports good appetite  -Unable to state where she lives "I would have to think about it awhile." -weight overall stable per e-chart trend -Poor po per staff.  Nutrition Focused Physical Exam:  Subcutaneous Fat:  Orbital Region: severe Upper Arm Region: moderate Thoracic and Lumbar Region: moderate  Muscle:  Temple Region: severe Clavicle Bone Region: mild Clavicle and Acromion Bone Region: mild Scapular Bone Region: moderate Dorsal Hand: severe Patellar Region: severe Anterior Thigh Region: n/a Posterior Calf Region: n/a  Edema: severe     Height: Ht Readings from Last 1 Encounters:  04/23/14 5\' 6"  (1.676 m)    Weight: Wt Readings from Last 1 Encounters:  04/27/14 123 lb 14.4 oz (56.2 kg)    Ideal Body Weight: 130 lbs  % Ideal Body Weight: 95  Wt Readings from Last 10 Encounters:  04/27/14 123 lb 14.4 oz (56.2  kg)  03/18/14 126 lb (57.153 kg)  03/10/14 126 lb 1.9 oz (57.208 kg)  11/19/13 139 lb 8 oz (63.277 kg)  08/20/13 124 lb (56.246 kg)  07/26/13 122 lb (55.339 kg)  04/17/13 119 lb (53.978 kg)  02/25/13 121 lb 1.9 oz (54.94 kg)  02/25/13 122 lb (55.339 kg)  02/15/13 122 lb 9.2 oz (55.6 kg)    Usual Body Weight: 122-126 lbs  % Usual Body Weight: 100  BMI:  Body mass index is 20.01 kg/(m^2).  Estimated Nutritional Needs: Kcal: 1450-1650 Protein: 60-70 Fluid: >/=1.5L daily  Skin: intact  Diet Order: General  EDUCATION NEEDS: -No education needs identified at this time   Intake/Output Summary (Last 24 hours) at 04/27/14 0921 Last data filed at 04/27/14 0600  Gross per 24 hour  Intake    480 ml  Output    325 ml  Net    155 ml     Labs:   Recent Labs Lab 04/25/14 0335 04/26/14 0343 04/27/14 0445  NA 138 137 141  K 3.8 3.7 3.7  CL 101 99 103  CO2 23 22 23   BUN 26* 33* 32*  CREATININE 1.50* 1.61* 1.50*  CALCIUM 9.0 9.0 8.8  GLUCOSE 112* 135* 112*    CBG (last 3)  No results found for this basename: GLUCAP,  in the last 72 hours  Scheduled Meds: . amLODipine  5 mg Oral Daily  . atorvastatin  20 mg Oral q1800  . [START ON 04/29/2014] cholecalciferol  1,000 Units Oral Weekly  . docusate sodium  100 mg Oral BID  . donepezil  10 mg Oral QHS  .  feeding supplement (ENSURE COMPLETE)  237 mL Oral BID BM  . levothyroxine  37.5 mcg Oral QAC breakfast  . metoprolol tartrate  100 mg Oral BID  . mometasone-formoterol  2 puff Inhalation BID  . senna  1 tablet Oral BID    Continuous Infusions:   Past Medical History  Diagnosis Date  . Status post AAA (abdominal aortic aneurysm) repair 01/02/2013  . PAD (peripheral artery disease) 01/02/2013  . Osteoporosis, unspecified 01/02/2013  . Hyperlipemia 01/02/2013  . Hx of atrial flutter, on Xarelto 01/02/2013  . Essential hypertension, benign 01/02/2013  . Chronic kidney disease 01/02/2013    stage 3/4  . Breast cancer  01/02/2013  . COPD (chronic obstructive pulmonary disease) 01/02/2013  . Dementia, on Namenda briefly in the past per review of records 01/02/2013  . GERD (gastroesophageal reflux disease) 01/02/2013  . Iron deficiency anemia 01/02/2013  . Unspecified hypothyroidism 01/02/2013    Past Surgical History  Procedure Laterality Date  . Abdominal aortic aneurysm repair    . Breast surgery      right lumpectomy    Antonieta Iba, RD, LDN Clinical Inpatient Dietitian Pager:  [phone] Weekend and after hours pager:  [phone]

## 2014-04-27 NOTE — Progress Notes (Addendum)
Pt for discharge to [facility] and [facility].   CSW received SNF authorization from [facility] on Friday evening.   CSW facilitated pt discharge needs including contacting facility, faxing pt discharge information to [facility], discussing with pt at bedside and pt daughter, [name] via telephone, providing RN phone number to call report, and arranging ambulance transport for pt to [facility] and [facility].   Pt made aware of transition to [facility] today. Pt daughter plans to meet pt at facility.  No further social work needs identified at this time.  CSW signing off.  Alison Murray, MSW, LCSW Clinical Social Work Weekend coverage [phone]

## 2014-04-27 NOTE — Discharge Summary (Addendum)
Physician Discharge Summary  Caroline Underwood QPY:[id number] DOB: Mar 31, 1922 DOA: 04/23/2014  PCP: Lucretia Kern., DO  Admit date: 04/23/2014 Discharge date: 04/27/2014  Recommendations for Outpatient Follow-up:  1. Transfer to SNF for ongoing PT/OT 2. Continue nutritional supplements TID  3. Daily weights and if she gains more than 3-lbs in a day or 5-lbs in a week, consider resuming lasix 4. F/u pending urine culture 5. Metoprolol was increased 6. Repeat CBC and BMP in 1-2 weeks to check creatinine and follow up leukocytosis and thrombocytopenia.  Consider heme-onc referral if persistent.  7. Cardiology, Dr. Acie Fredrickson in 2-4 weeks.  Please schedule appointment for follow up of a-fib with RVR, possible reinitiation of xarelto depending on progress with PT.  I held lasix so consider resuming if she has signs of edema/weight gain.    Discharge Diagnoses:  Principal Problem:   Pneumothorax Active Problems:   Atrial fibrillation with RVR   Hyperlipemia   Chronic kidney disease   COPD (chronic obstructive pulmonary disease)   Iron deficiency anemia   Abdominal pain, epigastric   Abdominal pain   Severe protein-calorie malnutrition   Leg pain, bilateral   Acute respiratory failure with hypoxia   Discharge Condition: stable, improved   Diet recommendation: regular  Wt Readings from Last 3 Encounters:  04/27/14 56.2 kg (123 lb 14.4 oz)  03/18/14 57.153 kg (126 lb)  03/10/14 57.208 kg (126 lb 1.9 oz)    History of present illness:  The patient is a 80 y.o. year-old female with history of PAD, atrial flutter on a/c, CKD stage 3/4, COPD, hypothyroidism, and mild dementia who presents with muscle aches and inability to walk. The patient was last at their baseline health a few days prior to admission. Per the patient, normally she lives with her son and daughter and ambulates with a walker. She has been unable to get around even with her walker for the last few days. She states she has been  bedbound for the last few days and her family has been assisting with caring for her. She denies sinus congestion, sore throat, cough, SOB, dysuria. She has severe groin pain with movement of either of her legs, but left leg movement is worse than right leg. She denies recent falls or trauma. Per ER notes, she had some chills and subjective fevers this morning before presenting to the ER.  In the ER, VSS except for mild hypertension to 169/68. Labs notable for WBC 18. Creatinine at baseline of 1.5. CK 67. Lactic acid 1.4. CXR without acute disease but demonstrated emphysema c/w previous COPD diagnosis. UA with proteinua, 11-20 RBC, rare WBC and rare bacteria. Negative LE and nitrite. XR of the pelvis due to pelvis pain demonstrated irregularily of the left femoral head and neck with possible subtle nondisplaced subcapital fracture of the left femoral neck.    Hospital Course:  Acute hypoxic respiratory failure secondary to moderate to large left pneumothorax s/p aspiration by thoracentesis on 10/8 by PCCM.  When she initially presented to the ER, her primary concern was her bilateral leg pain.  Because she was on anticoagulation and her X-rays were unremarkable, she underwent CT scan of the abdomen and pelvis to evaluate for spontaneous iliopsoas hemorrhage, however, instead, she was found to have a spontaneous left PTX which was not seen on her initial CXR.  This was probably related to her underlying emphysema.  She was admitted to stepdown and that evening, she developed acute hypoxic respiratory failure without significant respiratory distress.  A  day later (Xarelto was held), she underwent thoracentesis to drain about 300-582m of air and her CXR demonstrated marked improvement without recurrence of her PTX.  She was subsequently transitioned to room air and has remained stable.   Pelvic pain/hip pain without evidence of fracture. XR left hip negative for acute fracture, CT abd/pelvis without contrast:  No evidence of retroperitoneal bleed or other explanation for symptoms.   ESR 21, CK 67 so rhabdomyolysis and PMR are less likely.  Her pain may be related to an unremembered fall or arthritis.  She was started on as needed hydrocodone and PT/OT recommended SNF.  Her symptoms improved somewhat over the following days.  If her symptoms persist or worsen, consider orthopedics referral for possible intraarticular injections.    Leukocytosis, patient afebrile.  CXR negative for infiltrate.  Initial UA was notable for small hemoglobin and 11-20 RBC, 0-2 WBC and rare bacteria.  Her culture grew some E. Coli and lactobacillus which was felt to be contaminant.  Repeat UA without treatment with antibiotics was negative for nitrite, LE, WBC, and RBC.  Culture is pending.  Recommend against antibiotics.  Consider heme/onc consultation if her leukocytosis persists.    COPD, stable. Continued LABA/ICS.   Paroxysmal atrial flutter, previously in sinus rhythm but converted to a-fib with HR in 130s transiently which may have been triggered by a trial of bidil for blood pressure.  Her bidil was stopped.  Her metoprolol had recently been reduced because of mild bradycardia to the 50s in clinic, but I have increased it back to 1067mpo BID for now.  Her HR has remained in the 80s-90s since then.  Her anticoagulation was held due to falls risk with her recent hip pain and recent thoracentesis.  Should be readdressed by cardiology within 2-4 weeks as outpatient.     HTN, blood pressure labile, systolics elevated, diastolic low.  She continued norvasc and I tried some bidil which did help her blood pressure but which may have triggered her episode of a-fib with RVR.  This was discontinued.  Her metoprolol was increased slightly.    HLD, stable and CK wnl. LFTs wnl.  Continued statin.  CKD stage 3/4, baseline creatinine 1.6-.1.9.  Her medications were renally dosed.   Her lasix was held initially because she was not eating and  drinking much.  Recommend continuing to hold lasix for now due to poor appetite and risk of dehydation unless she shows signs of swelling/edema or her appetite improves.   Hypothyroidism, stable, monitored by PCP last month. Continue levothyroxine.  Dementia, stable with sundowning, on aricept  Mild normocytic anemia, stable hgb, repeat as outpatient in a few weeks  Mild thrombocytopenia, may be secondary to marrow suppression from acute illness.  Again, if this persists, would recommend hem/onc follow up. Mild hypokalemia, resolved with supplementation  Severe protein-calorie malnutrition.  She was seen by nutrition who recommended a regular diet with supplements.    Consultants:  Pulmonology, Dr. McLake Bellsrocedures:  CXR  XR pelvis: Possible left subcapital fracture  XR left hip: No evidence of fracture  CT ab/p without contrast: Moderate to large left PTX  Antibiotics:  none    Discharge Exam: Filed Vitals:   04/27/14 0507  BP: 147/75  Pulse: 91  Temp: 97.7 F (36.5 C)  Resp: 18   Filed Vitals:   04/26/14 2222 04/27/14 0100 04/27/14 0507 04/27/14 0909  BP:   147/75   Pulse:  79 91   Temp:   97.7 F (36.5  C)   TempSrc:   Oral   Resp:   18   Height:      Weight:   56.2 kg (123 lb 14.4 oz)   SpO2: 95%  98% 93%    General: WF, No acute distress, lying in bed, pleasant, interactive HEENT: NCAT, MMM  Cardiovascular: IRRR, nl S1, S2 no mrg, 2+ pulses, warm extremities  Respiratory: Equal BS bilaterally, no focal rales or rhonchi or wheeze anteriorly, no increased WOB  Abdomen: NABS, soft, ND, NT  MSK: Normal tone and bulk.  Able to move toes, ankles, and knees and hips today spontaneously without complaints of pain Neuro: Grossly intact   Discharge Instructions      Discharge Instructions   Call MD for:  difficulty breathing, headache or visual disturbances    Complete by:  As directed      Call MD for:  extreme fatigue    Complete by:  As directed      Call  MD for:  hives    Complete by:  As directed      Call MD for:  persistant dizziness or light-headedness    Complete by:  As directed      Call MD for:  persistant nausea and vomiting    Complete by:  As directed      Call MD for:  severe uncontrolled pain    Complete by:  As directed      Call MD for:  temperature >100.4    Complete by:  As directed      Diet general    Complete by:  As directed      Increase activity slowly    Complete by:  As directed             Medication List    STOP taking these medications       furosemide 40 MG tablet  Commonly known as:  LASIX     Rivaroxaban 15 MG Tabs tablet  Commonly known as:  XARELTO      TAKE these medications       amLODipine 5 MG tablet  Commonly known as:  NORVASC  Take 1 tablet (5 mg total) by mouth daily.     atorvastatin 20 MG tablet  Commonly known as:  LIPITOR  Take 1 tablet (20 mg total) by mouth daily at 6 PM.     cholecalciferol 1000 UNITS tablet  Commonly known as:  VITAMIN D  Take 1,000 Units by mouth once a week. On tuesday     donepezil 10 MG tablet  Commonly known as:  ARICEPT  Take one tablet (10 mg) by mouth at bedtime.     DSS 100 MG Caps  Take 100 mg by mouth 2 (two) times daily.     feeding supplement (ENSURE COMPLETE) Liqd  Take 237 mLs by mouth 2 (two) times daily between meals.     Fluticasone-Salmeterol 100-50 MCG/DOSE Aepb  Commonly known as:  ADVAIR  Inhale 1 puff into the lungs every 12 (twelve) hours.     HYDROcodone-acetaminophen 5-325 MG per tablet  Commonly known as:  NORCO/VICODIN  Take 1 tablet by mouth every 6 (six) hours as needed for moderate pain or severe pain.     levothyroxine 25 MCG tablet  Commonly known as:  LEVOTHROID  Take 1.5 tablets (37.5 mcg total) by mouth daily before breakfast.     Melatonin 3 MG Tabs  Take 3 mg by mouth at bedtime.     metoprolol  100 MG tablet  Commonly known as:  LOPRESSOR  Take 1 tablet (100 mg total) by mouth 2 (two) times  daily.     polyethylene glycol packet  Commonly known as:  MIRALAX / GLYCOLAX  Take 17 g by mouth daily as needed for mild constipation.     vitamin C 500 MG tablet  Commonly known as:  ASCORBIC ACID  Take 500 mg by mouth daily.       Follow-up Information   Follow up with [name], DO In 1 month. (As needed)    Specialty:  Family Medicine   Contact information:   [place] [address] [phone]       Follow up with Darden Amber., MD. Schedule an appointment as soon as possible for a visit in 2 weeks.   Specialty:  Cardiology   Contact information:   [place] [address] [phone]       The results of significant diagnostics from this hospitalization (including imaging, microbiology, ancillary and laboratory) are listed below for reference.    Significant Diagnostic Studies: Ct Abdomen Pelvis Wo Contrast  04/23/2014   CLINICAL DATA:  Evaluate for retroperitoneal hemorrhage ; bilateral groin aching also chills ; history of previous abdominal aortic aneurysm repair, COPD, common chronic renal insufficiency  EXAM: CT ABDOMEN AND PELVIS WITHOUT CONTRAST  TECHNIQUE: Multidetector CT imaging of the abdomen and pelvis was performed following the standard protocol without IV contrast.  COMPARISON:  PA and lateral chest x-ray of April 23, 2014 and pelvis series of the same day  FINDINGS: There is a large left-sided pneumothorax. There is a small amount of pleural fluid layering posteriorly on the left.  There is an aorto bi-iliac stent graft in place. The maximal AP diameter of the stented abdominal aorta is 3.9 cm which is stable. There is no periaortic hemorrhage. The peri-iliac soft tissues are unremarkable. The psoas musculature is normal. There is no intraperitoneal nor retroperitoneal hemorrhage.  The liver, gallbladder, pancreas, spleen, adrenal glands, and kidneys exhibit no acute abnormalities. There is chronic  right renal atrophy. There is a stable 5.5 cm mid to lower pole left renal cyst r. The small and large bowel exhibit no acute abnormalities. There is diverticulosis. The appendix is normal. The urinary bladder is normal. The uterus is small appropriate for age. There are no adnexal masses. There is no inguinal nor umbilical hernia.  IMPRESSION: 1. There is a large left-sided pneumothorax with small left pleural effusion. 2. The aorto bi-iliac stent graft and surrounding soft tissues are unremarkable. 3. There is no intraperitoneal nor retroperitoneal hemorrhage 4. There is chronic right renal atrophy. 5. Critical Value/emergent results were called by telephone at the time of interpretation on 04/23/2014 at 9:22 am to Dr. Dina Rich, who verbally acknowledged these results.   Electronically Signed   By: David  Martinique   On: 04/23/2014 09:38   Dg Chest 2 View  04/23/2014   ADDENDUM REPORT: 04/23/2014 18:04  ADDENDUM: A left-sided pneumothorax is present. The clinical team was made aware.   Electronically Signed   By: Lawrence Santiago M.D.   On: 04/23/2014 18:04   04/23/2014   CLINICAL DATA:  Initial encounter for body aches and chills. Fever. Next item  EXAM: CHEST  2 VIEW  COMPARISON:  One-view chest 02/16/2013.  FINDINGS: The heart size is normal. Emphysema is again noted. No focal airspace disease is evident. Atherosclerotic changes are noted at the aortic arch. The  visualized soft tissues and bony thorax are unremarkable.  IMPRESSION: 1. No acute cardiopulmonary disease or significant interval change. 2. Emphysema. 3. Atherosclerosis.  Electronically Signed: By: Lawrence Santiago M.D. On: 04/23/2014 02:23   Dg Pelvis 1-2 Views  04/23/2014   CLINICAL DATA:  Pelvic pain.  No known trauma.  EXAM: PELVIS - 1-2 VIEW  COMPARISON:  None.  FINDINGS: Degenerative osteoarthrosis noted about both hips. There is mild deformity about the left femoral head and neck, which appears new relative to most recent CT from 02/15/2013.  While this finding may be related to chronic degenerative changes, a possible acute subtle subcapital fracture of the left femoral head/neck is not entirely excluded.  Right hip is grossly intact. Bony pelvis is intact. SI joints are approximated. No pubic diastasis.  No soft tissue abnormality.  IMPRESSION: 1. Irregularity about the left femoral head and neck. While these findings are likely related to chronic underlying degenerative changes, a possible subtle acute nondisplaced subcapital fracture of the left femoral neck is not entirely excluded. Correlation with physical exam recommended. Additionally, dedicated radiographs of the left hip may be helpful for further evaluation. 2. No other acute abnormality within the pelvis.   Electronically Signed   By: Jeannine Boga M.D.   On: 04/23/2014 06:21   Dg Hip Complete Left  04/23/2014   CLINICAL DATA:  Atraumatic left groin pain ; history of dementia; limited history available  EXAM: LEFT HIP - COMPLETE 2+ VIEW  COMPARISON:  AP pelvis of April 23, 2014  FINDINGS: The bony pelvis is adequately mineralized for age. There is no acute fracture. There is degenerative change of the hip joints greater on the left than on the right. No acute hip fracture or dislocation is demonstrated. The overlying soft tissues are unremarkable. There is no evidence of gas within a left inguinal hernia. There is an aorto bi-iliac stent graft in place.  IMPRESSION: There are degenerative changes of the left hip but there is no acute fracture nor dislocation. No acute abnormality elsewhere is demonstrated.   Electronically Signed   By: David  Martinique   On: 04/23/2014 08:36   Dg Chest Port 1 View  04/26/2014   CLINICAL DATA:  COPD; recent pneumothorax.  EXAM: PORTABLE CHEST - 1 VIEW  COMPARISON:  April 25, 2014  FINDINGS: There remains a minimal left apical pneumothorax, slightly smaller than 2 days prior essentially stable compared to 1 day prior. Basilar pneumothorax on the  left is no longer appreciable. There is underlying emphysema. There is no edema or consolidation. Heart size is normal. Pulmonary vascularity reflects underlying emphysema. Atherosclerotic change is noted in the aorta. No adenopathy. Bones are osteoporotic.  IMPRESSION: Minimal left apical pneumothorax remains. Underlying emphysema. No edema or consolidation.   Electronically Signed   By: Lowella Grip M.D.   On: 04/26/2014 07:27   Dg Chest Port 1 View  04/25/2014   CLINICAL DATA:  Follow-up of left-sided pneumothorax; history of COPD, breast malignancy, and atrial flutter  EXAM: PORTABLE CHEST - 1 VIEW  COMPARISON:  Portable chest x-ray of April 24, 2014  FINDINGS: There is a tiny pneumothorax remaining at the left lung base. No apical pneumothorax is demonstrated. Both lungs are hyperinflated. The interstitial markings are minimally prominent though stable. There is no alveolar pneumonia. The heart and pulmonary vascularity are normal. The trachea is midline. There is no significant pleural effusion. The bony thorax is unremarkable.  IMPRESSION: COPD. There is a tiny residual pneumothorax at the left lung  base laterally.   Electronically Signed   By: David  Martinique   On: 04/25/2014 07:49   Dg Chest Port 1 View  04/24/2014   CLINICAL DATA:  Follow-up pneumothorax  EXAM: PORTABLE CHEST - 1 VIEW  COMPARISON:  04/24/2014  FINDINGS: Cardiomediastinal silhouette is stable. There is a small residual left upper pneumothorax. No acute infiltrate or pulmonary edema. Tiny residual left lower lateral pneumothorax.  IMPRESSION: Small residual left upper pneumothorax. Tiny residual left lower lateral pneumothorax.   Electronically Signed   By: Lahoma Crocker M.D.   On: 04/24/2014 15:46   Dg Chest Port 1 View  04/24/2014   CLINICAL DATA:  History breast cancer.  Hypertension.  EXAM: PORTABLE CHEST - 1 VIEW  COMPARISON:  04/24/2014.  FINDINGS: Mediastinum hilar structures are normal the lungs are clear. Heart size  normal. Mild left base subsegmental atelectasis and tiny left pleural effusion. Scratch Previously identified pneumothorax has resolved. No acute new bony abnormalities.  IMPRESSION: Interim resolution of the left-sided pneumothorax. Mild left base subsegmental atelectasis and tiny left pleural effusion noted.   Electronically Signed   By: Marcello Moores  Register   On: 04/24/2014 11:02   Dg Chest Port 1 View  04/24/2014   CLINICAL DATA:  80 year old female with known pneumothorax.  EXAM: PORTABLE CHEST - 1 VIEW  COMPARISON:  Chest x-ray 04/23/2014.  FINDINGS: Again noted is a moderate left-sided pneumothorax (approximately 20% volume of the left hemithorax). This appears slightly decreased in size compared to yesterday's examination. No chest tube is identified at this time. Right lung is well-expanded, without pneumothorax or consolidative airspace disease. No pleural effusions. No evidence of pulmonary edema. Heart size is normal. Mediastinal contours are unremarkable. Atherosclerosis in the thoracic aorta.  IMPRESSION: 1. Moderate left-sided pneumothorax appears slightly decreased in size compared to yesterday's examination, estimated to occupy approximately 20% of the volume of the left hemithorax the present time. 2. No mediastinal shift or other findings to suggest tension pneumothorax. 3. Atherosclerosis.   Electronically Signed   By: Vinnie Langton M.D.   On: 04/24/2014 07:21   Dg Chest Port 1 View  04/23/2014   CLINICAL DATA:  80 year old female with left pneumothorax discovered on CT Abdomen and Pelvis at 0856 hrs. Initial encounter.  EXAM: PORTABLE CHEST - 1 VIEW  COMPARISON:  CT Abdomen and Pelvis from today reported separately. Chest radiographs 0156 hrs.  FINDINGS: Moderate size left pneumothorax, stable since 0156 hrs. Pleural edge visible laterally to the lung base. Mild associated atelectasis. No mediastinal shift. Normal cardiac size and mediastinal contours. Visualized tracheal air column is  within normal limits. No confluent pulmonary opacity elsewhere.  IMPRESSION: Moderate size left pneumothorax, no mediastinal shift or other acute cardiopulmonary abnormality.   Electronically Signed   By: Lars Pinks M.D.   On: 04/23/2014 16:28    Microbiology: Recent Results (from the past 240 hour(s))  URINE CULTURE     Status: None   Collection Time    04/23/14  2:47 AM      Result Value Ref Range Status   Specimen Description URINE, CLEAN CATCH   Final   Special Requests Normal   Final   Culture  Setup Time     Final   Value: 04/23/2014 10:45     Performed at [facility]     Final   Value: 60,000 COLONIES/ML     Performed at [facility]   Culture     Final   Value: ESCHERICHIA  COLI     LACTOBACILLUS SPECIES     Note: Standardized susceptibility testing for this organism is not available.     Performed at [facility]   Report Status 04/25/2014 FINAL   Final   Organism ID, Bacteria ESCHERICHIA COLI   Final  CULTURE, BLOOD (ROUTINE X 2)     Status: None   Collection Time    04/23/14  4:43 AM      Result Value Ref Range Status   Specimen Description BLOOD LEFT ANTECUBITAL   Final   Special Requests BOTTLES DRAWN AEROBIC AND ANAEROBIC 5CC   Final   Culture  Setup Time     Final   Value: 04/23/2014 10:11     Performed at [facility]   Culture     Final   Value:        BLOOD CULTURE RECEIVED NO GROWTH TO DATE CULTURE WILL BE HELD FOR 5 DAYS BEFORE ISSUING A FINAL NEGATIVE REPORT     Performed at [facility]   Report Status PENDING   Incomplete  CULTURE, BLOOD (ROUTINE X 2)     Status: None   Collection Time    04/23/14  4:45 AM      Result Value Ref Range Status   Specimen Description BLOOD RIGHT HAND   Final   Special Requests BOTTLES DRAWN AEROBIC AND ANAEROBIC 5CC   Final   Culture  Setup Time     Final   Value: 04/23/2014 10:11     Performed at [facility]   Culture     Final   Value:        BLOOD  CULTURE RECEIVED NO GROWTH TO DATE CULTURE WILL BE HELD FOR 5 DAYS BEFORE ISSUING A FINAL NEGATIVE REPORT     Performed at [facility]   Report Status PENDING   Incomplete  MRSA PCR SCREENING     Status: None   Collection Time    04/23/14 12:52 PM      Result Value Ref Range Status   MRSA by PCR NEGATIVE  NEGATIVE Final   Comment:            The GeneXpert MRSA Assay (FDA     approved for NASAL specimens     only), is one component of a     comprehensive MRSA colonization     surveillance program. It is not     intended to diagnose MRSA     infection nor to guide or     monitor treatment for     MRSA infections.     Labs: Basic Metabolic Panel:  Recent Labs Lab 04/23/14 0219 04/24/14 0328 04/25/14 0335 04/26/14 0343 04/27/14 0445  NA 139 137 138 137 141  K 3.8 3.6* 3.8 3.7 3.7  CL 99 99 101 99 103  CO2 25 25 23 22 23   GLUCOSE 163* 143* 112* 135* 112*  BUN 24* 21 26* 33* 32*  CREATININE 1.50* 1.34* 1.50* 1.61* 1.50*  CALCIUM 9.3 9.1 9.0 9.0 8.8   Liver Function Tests:  Recent Labs Lab 04/23/14 1020 04/24/14 0328 04/25/14 0335 04/26/14 0343  AST 21 21 21 25   ALT 16 14 12 12   ALKPHOS 79 75 66 64  BILITOT 0.8 0.7 0.7 0.9  PROT 6.8 6.7 6.5 6.4  ALBUMIN 3.5 3.4* 3.1* 3.1*    Recent Labs Lab 04/23/14 1020  LIPASE 33   No results found for this basename: AMMONIA,  in the last 168  hours CBC:  Recent Labs Lab 04/23/14 0219 04/24/14 0328 04/25/14 0335 04/26/14 0343 04/27/14 0445  WBC 18.0* 14.6* 13.3* 15.8* 12.7*  NEUTROABS 15.4*  --   --   --   --   HGB 13.3 12.3 11.9* 11.2* 11.0*  HCT 37.7 36.9 35.7* 32.9* 32.0*  MCV 90.2 92.0 92.7 91.9 91.2  PLT 176 124* 108* 122* 102*   Cardiac Enzymes:  Recent Labs Lab 04/23/14 0613 04/23/14 1020 04/23/14 1511 04/23/14 2114  CKTOTAL 67  --   --   --   TROPONINI  --  <0.30 <0.30 <0.30   BNP: BNP (last 3 results) No results found for this basename: PROBNP,  in the last 8760 hours CBG: No  results found for this basename: GLUCAP,  in the last 168 hours  Time coordinating discharge: 45 minutes  Signed:  Arnulfo Batson  [facility] 04/27/2014, 11:30 AM

## 2014-04-27 NOTE — Progress Notes (Signed)
Pt has left with EMS to [facility]. Writer called pt's daughter and alerted her to d/c.

## 2014-04-29 LAB — CULTURE, BLOOD (ROUTINE X 2)
CULTURE: NO GROWTH
Culture: NO GROWTH

## 2014-05-09 ENCOUNTER — Encounter: Payer: Self-pay | Admitting: *Deleted

## 2014-05-13 ENCOUNTER — Ambulatory Visit: Payer: Medicare Other | Admitting: Family Medicine

## 2014-05-20 ENCOUNTER — Encounter: Payer: Self-pay | Admitting: Physician Assistant

## 2014-05-20 ENCOUNTER — Ambulatory Visit (INDEPENDENT_AMBULATORY_CARE_PROVIDER_SITE_OTHER): Payer: Medicare Other | Admitting: Physician Assistant

## 2014-05-20 VITALS — BP 152/70 | HR 50 | Ht 66.0 in | Wt 126.8 lb

## 2014-05-20 DIAGNOSIS — J939 Pneumothorax, unspecified: Secondary | ICD-10-CM

## 2014-05-20 DIAGNOSIS — N183 Chronic kidney disease, stage 3 unspecified: Secondary | ICD-10-CM

## 2014-05-20 DIAGNOSIS — I480 Paroxysmal atrial fibrillation: Secondary | ICD-10-CM

## 2014-05-20 DIAGNOSIS — M79604 Pain in right leg: Secondary | ICD-10-CM

## 2014-05-20 DIAGNOSIS — M79605 Pain in left leg: Secondary | ICD-10-CM

## 2014-05-20 DIAGNOSIS — I10 Essential (primary) hypertension: Secondary | ICD-10-CM

## 2014-05-20 DIAGNOSIS — E785 Hyperlipidemia, unspecified: Secondary | ICD-10-CM

## 2014-05-20 DIAGNOSIS — J449 Chronic obstructive pulmonary disease, unspecified: Secondary | ICD-10-CM

## 2014-05-20 LAB — BASIC METABOLIC PANEL
BUN: 20 mg/dL (ref 6–23)
CALCIUM: 9.4 mg/dL (ref 8.4–10.5)
CO2: 30 mEq/L (ref 19–32)
Chloride: 105 mEq/L (ref 96–112)
Creatinine, Ser: 1.5 mg/dL — ABNORMAL HIGH (ref 0.4–1.2)
GFR: 33.63 mL/min — AB (ref >60.00)
GLUCOSE: 99 mg/dL (ref 70–99)
POTASSIUM: 5.1 meq/L (ref 3.5–5.1)
SODIUM: 139 meq/L (ref 135–145)

## 2014-05-20 LAB — CBC WITH DIFFERENTIAL/PLATELET
BASOS ABS: 0 10*3/uL (ref 0.0–0.1)
Basophils Relative: 0.3 % (ref 0.0–3.0)
EOS PCT: 3.2 % (ref 0.0–5.0)
Eosinophils Absolute: 0.4 10*3/uL (ref 0.0–0.7)
HEMATOCRIT: 39.3 % (ref 36.0–46.0)
Hemoglobin: 12.8 g/dL (ref 12.0–15.0)
LYMPHS PCT: 15.4 % (ref 12.0–46.0)
Lymphs Abs: 1.8 10*3/uL (ref 0.7–4.0)
MCHC: 32.6 g/dL (ref 30.0–36.0)
MCV: 95.8 fl (ref 78.0–100.0)
MONOS PCT: 6.1 % (ref 3.0–12.0)
Monocytes Absolute: 0.7 10*3/uL (ref 0.1–1.0)
Neutro Abs: 8.5 10*3/uL — ABNORMAL HIGH (ref 1.4–7.7)
Neutrophils Relative %: 75 % (ref 43.0–77.0)
Platelets: 195 10*3/uL (ref 150.0–400.0)
RBC: 4.1 Mil/uL (ref 3.87–5.11)
RDW: 14.2 % (ref 11.5–15.5)
WBC: 11.4 10*3/uL — AB (ref 4.0–10.5)

## 2014-05-20 MED ORDER — METOPROLOL TARTRATE 50 MG PO TABS: 50.0000 mg | ORAL_TABLET | Freq: Two times a day (BID) | ORAL | Status: DC

## 2014-05-20 MED ORDER — APIXABAN 2.5 MG PO TABS: 2.5000 mg | ORAL_TABLET | Freq: Two times a day (BID) | ORAL | Status: DC

## 2014-05-20 NOTE — Patient Instructions (Signed)
Your physician has recommended you make the following change in your medication:  1) START Eliquis 2.5mg  twice daily 2) DECREASE Metoprolol to 50mg  twice daily  Take all other medications as prescribed  Lab Today: [facility], [facility] physician recommends that you return for lab work in: 6-8 weeks (after 07/18/14)  You have been referred to [facility] in 6-8 weeks (after 07/18/14)  Your physician recommends that you schedule a follow-up appointment in: 3 months with Dr.Nahser

## 2014-05-20 NOTE — Progress Notes (Signed)
Cardiology Office Note   Date:  05/20/2014   ID:  Shamaine Mulkern, DOB 1921/08/18, MRN [id number]  PCP:  Lucretia Kern., DO  Cardiologist:  Dr. Liam Rogers     History of Present Illness: Caroline Underwood is a 80 y.o. female with a history of atrial flutter, AAA status post repair, HTN, HL, CKD, breast CA, COPD, dementia. She has been on Xarelto for anticoagulation.  She moved from [place], [place] to [place] in 2014. Last seen by Dr. Acie Fredrickson or 8/15.    Admitted 10/7-10/11. She presented with muscle aches and inability to walk along with severe bilateral hip pain (left greater than right). Imaging was negative for fracture. However, she had an incidental finding of a large left pneumothorax. She was seen by critical care and underwent thoracentesis with removal of 300-500 cc of air and the pneumothorax resolved. She had no evidence of retroperitoneal bleed on CT. Workup for her weakness and leg pain was fairly unremarkable. She was seen by PT and OT and discharged to SNF.  She did have leukocytosis with negative workup for infection. She did develop atrial fibrillation with RVR. Rate controlling medications were adjusted. Her anticoagulation had been held in anticipation of chest tube placement for pneumothorax. There was increased concern for fall risk and her anticoagulation was held at discharge (for cardiology to decide when/if to resume). She returns for follow-up.  She is here today with her daughter. She was just released from [facility] today. Her mobility has improved. She denies any significant issues with balance. She now uses a walker. She has not fallen. She denies chest discomfort, shortness of breath, orthopnea, PND or significant edema.   Studies:  - Echo (4/14):  From [place]-EF 57%, diastolic dysfunction, trace AI, moderate MRmoderate LAE, normal RV function, PASP 44 mmHg (mild pulmonary hypertension  - Abdominal CTA (7/12): Done in [place]-Aortoiliac stent graft  without endoleak, residual infrarenal AAA measuring3.4x4.6 cm   Recent Labs/Images: 11/19/2013: LDL (calc) 68 03/18/2014: TSH 0.56 04/26/2014: ALT 12 04/27/2014: BUN 32*; Creatinine 1.50*; Hemoglobin 11.0*; Potassium 3.7; Sodium 141   Ct Abdomen Pelvis Wo Contrast  04/23/2014     IMPRESSION: 1. There is a large left-sided pneumothorax with small left pleural effusion. 2. The aorto bi-iliac stent graft and surrounding soft tissues are unremarkable. 3. There is no intraperitoneal nor retroperitoneal hemorrhage 4. There is chronic right renal atrophy. 5. Critical Value/emergent results were called by telephone at the time of interpretation on 04/23/2014 at 9:22 am to Dr. Dina Rich, who verbally acknowledged these results.   Electronically Signed   By: David  Martinique   On: 04/23/2014 09:38    Wt Readings from Last 3 Encounters:  05/20/14 126 lb 12.8 oz (57.516 kg)  04/27/14 123 lb 14.4 oz (56.2 kg)  03/18/14 126 lb (57.153 kg)     Past Medical History  Diagnosis Date  . Status post AAA (abdominal aortic aneurysm) repair 01/02/2013  . PAD (peripheral artery disease) 01/02/2013  . Osteoporosis, unspecified 01/02/2013  . Hyperlipemia 01/02/2013  . Hx of atrial flutter, on Xarelto 01/02/2013  . Essential hypertension, benign 01/02/2013  . Chronic kidney disease 01/02/2013    stage 3/4  . Breast cancer 01/02/2013  . COPD (chronic obstructive pulmonary disease) 01/02/2013  . Dementia, on Namenda briefly in the past per review of records 01/02/2013  . GERD (gastroesophageal reflux disease) 01/02/2013  . Iron deficiency anemia 01/02/2013  . Unspecified hypothyroidism 01/02/2013    Current Outpatient Prescriptions  Medication Sig Dispense Refill  .  amLODipine (NORVASC) 5 MG tablet Take 1 tablet (5 mg total) by mouth daily. 90 tablet 3  . atorvastatin (LIPITOR) 20 MG tablet Take 1 tablet (20 mg total) by mouth daily at 6 PM. 90 tablet 0  . cholecalciferol (VITAMIN D) 1000 UNITS tablet Take 1,000 Units by mouth  once a week. On tuesday    . docusate sodium 100 MG CAPS Take 100 mg by mouth 2 (two) times daily. 10 capsule 0  . donepezil (ARICEPT) 10 MG tablet Take one tablet (10 mg) by mouth at bedtime. 90 tablet 3  . feeding supplement, ENSURE COMPLETE, (ENSURE COMPLETE) LIQD Take 237 mLs by mouth 2 (two) times daily between meals. 60 Bottle 0  . Fluticasone-Salmeterol (ADVAIR) 100-50 MCG/DOSE AEPB Inhale 1 puff into the lungs every 12 (twelve) hours.    Marland Kitchen HYDROcodone-acetaminophen (NORCO/VICODIN) 5-325 MG per tablet Take 1 tablet by mouth every 6 (six) hours as needed for moderate pain or severe pain. 20 tablet 0  . levothyroxine (LEVOTHROID) 25 MCG tablet Take 1.5 tablets (37.5 mcg total) by mouth daily before breakfast. 90 tablet 5  . Melatonin 3 MG TABS Take 3 mg by mouth at bedtime.    . metoprolol (LOPRESSOR) 100 MG tablet Take 1 tablet (100 mg total) by mouth 2 (two) times daily. 60 tablet 0  . polyethylene glycol (MIRALAX / GLYCOLAX) packet Take 17 g by mouth daily as needed for mild constipation. 14 each 0  . vitamin C (ASCORBIC ACID) 500 MG tablet Take 500 mg by mouth daily.     No current facility-administered medications for this visit.     Allergies:   Review of patient's allergies indicates no known allergies.   Social History:  The patient  reports that she quit smoking about 6 years ago. Her smoking use included Cigarettes. She smoked 0.50 packs per day. She has never used smokeless tobacco. She reports that she does not drink alcohol or use illicit drugs.   Family History:  The patient's family history includes Cancer in her mother; Heart disease (age of onset: 64) in her father.   ROS:  Please see the history of present illness.   She denies melena, hematochezia, hematemesis, hematuria.   All other systems reviewed and negative.    PHYSICAL EXAM: VS:  BP 152/70 mmHg  Pulse 50  Ht 5\' 6"  (1.676 m)  Wt 126 lb 12.8 oz (57.516 kg)  BMI 20.48 kg/m2 Well nourished, well developed, in  no acute distress HEENT: normal Neck: no JVDat 90 Cardiac:  normal S1, S2; RRR; no murmur Lungs:  clear to auscultation bilaterally, no wheezing, rhonchi or rales Abd: soft, nontender, no hepatomegaly Ext: trace bilateral ankle edema Skin: warm and dry Neuro:  CNs 2-12 intact, no focal abnormalities noted  EKG:  Sinus bradycardia, HR 50, nonspecific ST-T wave changes      ASSESSMENT AND PLAN:  1.  Paroxysmal atrial fibrillation: She is back in sinus rhythm today.  She is bradycardic.  CHADS2-VASc=5. She would definitely benefit from anticoagulation therapy. Currently, her fall risk does not appear to be prohibitive. I had a long discussion with the patient and her daughter regarding resuming anticoagulation therapy.    -  Start Eliquis 2.5 mg twice a day (age >80, weight <60 kg, Cr 1.5).    -  Check BMET and CBC today. Repeat in 6-8 weeks.    -  Arrange follow-up evaluation in the CVRR clinic.    -  Decrease metoprolol tartrate to 50 mg twice  a day. 2.  Essential hypertension, benign:  Blood pressure elevated today.  Continue to monitor. 3.  Hyperlipemia:  Continue statin. 4.  Pneumothorax:  Resolved. 5.  Leg pain, bilateral:  Resolved. 6.  Chronic obstructive pulmonary disease, unspecified COPD, unspecified chronic bronchitis type:  Follow-up with primary care. 7.  Chronic kidney disease, stage 3 (moderate):  Check BMET today.  Disposition:   FU with Dr. Acie Fredrickson 3 months.   Signed, Versie Starks, MHS 05/20/2014 11:47 AM    [facility] [address], [place], [place]  [address] Phone: [phone]; Fax: [phone]

## 2014-05-21 ENCOUNTER — Telehealth: Payer: Self-pay | Admitting: Family Medicine

## 2014-05-21 NOTE — Telephone Encounter (Signed)
Joann from [facility] called to say that they are requesting rehab for pt after a fall. They need a verbal order    Effective 05/21/14   Physical Therapy 2 times a week for 7 weeks 1 times a week for 2 weeks   Please contact Joann at  [facility] phone number [phone]

## 2014-05-22 ENCOUNTER — Telehealth: Payer: Self-pay | Admitting: *Deleted

## 2014-05-22 ENCOUNTER — Ambulatory Visit: Payer: Medicare Other | Admitting: Oncology

## 2014-05-22 NOTE — Telephone Encounter (Signed)
lmptcb to go over lab results 

## 2014-05-22 NOTE — Telephone Encounter (Signed)
I called Joann with [facility] [phone]) and she was given a verbal order for: physical Therapy 2 times a week for 7 weeks 1 times a week for 2 weeks as of 05/21/2014.

## 2014-05-22 NOTE — Telephone Encounter (Signed)
I don't think I have seen her in a few months. Ok to order but needs app.

## 2014-05-23 NOTE — Telephone Encounter (Signed)
both pt and daughter notified about lab results and results also sent into [facility] as well, which daughter has proxy to [facility] for pt.

## 2014-05-26 ENCOUNTER — Telehealth: Payer: Self-pay | Admitting: Family Medicine

## 2014-05-26 ENCOUNTER — Encounter: Payer: Self-pay | Admitting: Neurology

## 2014-05-26 ENCOUNTER — Ambulatory Visit (INDEPENDENT_AMBULATORY_CARE_PROVIDER_SITE_OTHER): Payer: Medicare Other | Admitting: Neurology

## 2014-05-26 VITALS — BP 112/66 | HR 68 | Temp 98.0°F | Resp 18 | Wt 123.8 lb

## 2014-05-26 DIAGNOSIS — F0280 Dementia in other diseases classified elsewhere without behavioral disturbance: Secondary | ICD-10-CM

## 2014-05-26 DIAGNOSIS — G309 Alzheimer's disease, unspecified: Secondary | ICD-10-CM

## 2014-05-26 NOTE — Telephone Encounter (Signed)
Can we figure out who ordered this and what it is for? If related to her dementia orders should go to neurologist. If not, please let me know.

## 2014-05-26 NOTE — Progress Notes (Signed)
NEUROLOGY FOLLOW UP OFFICE NOTE  Caroline Underwood [id number]  HISTORY OF PRESENT ILLNESS: Caroline Underwood is a 80 y.o. female with history of right breast cancer, hypertension, peripheral artery disease, chronic kidney disease, iron deficiency anemia, COPD, and a flutter follows up for Alzheimer's disease. She is accompanied by her daughter.  Recent hospital records, labs and scans reviewed.   UPDATE: She was in the hospital on 10/7-10/8 for inability to walk.  X-ray of the left hip revealed no acute fracture and CT of the abdomen and pelvis was negative for iliopsoas hemorrhage but did reveal spontaneous left pneumothorax, thought to be related to emphysema.  Hip pain thought to be related to arthritis or a prior fall that she does not remember. 03/18/14:  Na 141, K 3.3, glucose 94, BUN 19, Cr 1.6, TSH 0.56.  She has overall been doing well.  She is living with her daughter.  She is able to dress herself and use the toilet herself.  She is able to bathe herself but her daughter stands watch to make sure she doesn't fall.  She needs reminding to take her medications.  She sleeps well.  She takes melatonin.  Sometimes she moans in her sleep but she does not recall any dreams.  She does get up at night to go to the bathroom, which is concerning to her daughter since she is a fall risk.  She uses a walker.  Her mood is good.   She does not have any hallucinations, delusions, or change in personality.  Appetite is good.  She denies depression.  She watches a lot of TV but no longer plays crossword puzzles.  HISTORY: She began experiencing symptoms many years ago. She was having problems with memory and often repeating questions, as well as forgetting tasks and misplacing objects. At some point, several years ago, she was started on Namenda by her primary care physician, but discontinued it at the request of her son. There has been a progressive decline in her condition. Her decline has significantly  worsened over the past year, particularly following episodes of acute renal failure and hypertensive urgency.  She developed an episode of altered mental status, where she was walking around naked and then was laying on the couch for 2 days. She was also found to have a flutter and was started on Xarelto.  Her confusion has improved and she is functioning better, but definitely worse than before. She has good days and bad days.  She has always been able to pay her bills, up until last year.   She currently takes Aricept 10mg  daily.  She was unable to afford Namenda.  MMSE from 07/26/13 was 25/30.  MOCA from July 2014 was 15/30.  PAST MEDICAL HISTORY: Past Medical History  Diagnosis Date  . Status post AAA (abdominal aortic aneurysm) repair 01/02/2013  . PAD (peripheral artery disease) 01/02/2013  . Osteoporosis, unspecified 01/02/2013  . Hyperlipemia 01/02/2013  . Hx of atrial flutter, on Xarelto 01/02/2013  . Essential hypertension, benign 01/02/2013  . Chronic kidney disease 01/02/2013    stage 3/4  . Breast cancer 01/02/2013  . COPD (chronic obstructive pulmonary disease) 01/02/2013  . Dementia, on Namenda briefly in the past per review of records 01/02/2013  . GERD (gastroesophageal reflux disease) 01/02/2013  . Iron deficiency anemia 01/02/2013  . Unspecified hypothyroidism 01/02/2013    MEDICATIONS: Current Outpatient Prescriptions on File Prior to Visit  Medication Sig Dispense Refill  . amLODipine (NORVASC) 5 MG tablet Take 1 tablet (  5 mg total) by mouth daily. 90 tablet 3  . apixaban (ELIQUIS) 2.5 MG TABS tablet Take 1 tablet (2.5 mg total) by mouth 2 (two) times daily. 60 tablet 5  . atorvastatin (LIPITOR) 20 MG tablet Take 1 tablet (20 mg total) by mouth daily at 6 PM. 90 tablet 0  . cholecalciferol (VITAMIN D) 1000 UNITS tablet Take 1,000 Units by mouth once a week. On tuesday    . docusate sodium 100 MG CAPS Take 100 mg by mouth 2 (two) times daily. 10 capsule 0  . donepezil (ARICEPT)  10 MG tablet Take one tablet (10 mg) by mouth at bedtime. 90 tablet 3  . feeding supplement, ENSURE COMPLETE, (ENSURE COMPLETE) LIQD Take 237 mLs by mouth 2 (two) times daily between meals. 60 Bottle 0  . Fluticasone-Salmeterol (ADVAIR) 100-50 MCG/DOSE AEPB Inhale 1 puff into the lungs every 12 (twelve) hours.    Marland Kitchen HYDROcodone-acetaminophen (NORCO/VICODIN) 5-325 MG per tablet Take 1 tablet by mouth every 6 (six) hours as needed for moderate pain or severe pain. 20 tablet 0  . levothyroxine (LEVOTHROID) 25 MCG tablet Take 1.5 tablets (37.5 mcg total) by mouth daily before breakfast. 90 tablet 5  . Melatonin 3 MG TABS Take 3 mg by mouth at bedtime.    . metoprolol (LOPRESSOR) 50 MG tablet Take 1 tablet (50 mg total) by mouth 2 (two) times daily.    . polyethylene glycol (MIRALAX / GLYCOLAX) packet Take 17 g by mouth daily as needed for mild constipation. 14 each 0  . vitamin C (ASCORBIC ACID) 500 MG tablet Take 500 mg by mouth daily.     No current facility-administered medications on file prior to visit.    ALLERGIES: No Known Allergies  FAMILY HISTORY: Family History  Problem Relation Age of Onset  . Cancer Mother     lung  . Heart disease Father 30    SOCIAL HISTORY: History   Social History  . Marital Status: Widowed    Spouse Name: N/A    Number of Children: N/A  . Years of Education: N/A   Occupational History  . Not on file.   Social History Main Topics  . Smoking status: Former Smoker -- 0.50 packs/day    Types: Cigarettes    Quit date: 02/16/2008  . Smokeless tobacco: Never Used  . Alcohol Use: No  . Drug Use: No  . Sexual Activity: No   Other Topics Concern  . Not on file   Social History Narrative   Home Situation: living with daughter Caroline Underwood)      Spiritual Beliefs: none      Lifestyle: get around well in the house - uses a walker sometimes, has not had a history of any falls, has some mild dementia. She needs help with bathing. She needs some help with  dressing. She does not do her own cooking or cleaning. Does not drive. She did manage all of her finances until 10/2012.             REVIEW OF SYSTEMS: Constitutional: No fevers, chills, or sweats, no generalized fatigue, change in appetite Eyes: No visual changes, double vision, eye pain Ear, nose and throat: No hearing loss, ear pain, nasal congestion, sore throat Cardiovascular: No chest pain, palpitations Respiratory:  No shortness of breath at rest or with exertion, wheezes GastrointestinaI: No nausea, vomiting, diarrhea, abdominal pain, fecal incontinence Genitourinary:  No dysuria, urinary retention or frequency Musculoskeletal:  No neck pain, back pain Integumentary: No rash, pruritus, skin  lesions Neurological: as above Psychiatric: No depression, insomnia, anxiety Endocrine: No palpitations, fatigue, diaphoresis, mood swings, change in appetite, change in weight, increased thirst Hematologic/Lymphatic:  No anemia, purpura, petechiae. Allergic/Immunologic: no itchy/runny eyes, nasal congestion, recent allergic reactions, rashes  PHYSICAL EXAM: Filed Vitals:   05/26/14 1514  BP: 112/66  Pulse: 68  Temp: 98 F (36.7 C)  Resp: 18   General: No acute distress Eyes:  Fundi could not be visualized. Head:  Normocephalic/atraumatic Neck: supple, no paraspinal tenderness, full range of motion Heart:  Regular rate and rhythm Lungs:  Clear to auscultation bilaterally Back: No paraspinal tenderness Neurological Exam: alert and oriented to person, place, and time. Attention span and concentration intact, remote memory poor, remote memory intact, fund of knowledge intact.  Speech fluent and not dysarthric, language intact.   Montreal Cognitive Assessment  05/26/2014  Visuospatial/ Executive (0/5) 2  Naming (0/3) 2  Attention: Read list of digits (0/2) 2  Attention: Read list of letters (0/1) 1  Attention: Serial 7 subtraction starting at 100 (0/3) 3  Language: Repeat phrase  (0/2) 2  Language : Fluency (0/1) 0  Abstraction (0/2) 1  Delayed Recall (0/5) 0  Orientation (0/6) 3  Total 16  Adjusted Score (based on education) 16   CN II-XII intact. Fundi not visualized.  Bulk and tone normal, muscle strength 5/5 throughout.  Sensation to light touch  intact.  Deep tendon reflexes 2+ throughout.  Finger to nose intact.  Gait cautious with short strides..  IMPRESSION: Alzheimer's disease  PLAN: 1.  Continue Aricept 2.  Try to do crossword puzzles or brain teasers at least 30 minutes a day. 3.  Stop drinking or eating for 3 to 4 hours before going to bed.  Continue to drink fluids during the day to prevent dehydration. 4.  Follow up in 9 months.  Metta Clines, DO  CC:  Colin Benton, DO

## 2014-05-26 NOTE — Telephone Encounter (Signed)
I left a message for [facility] to return my call.

## 2014-05-26 NOTE — Telephone Encounter (Signed)
Malori called back and stated the PT was initially ordered from a physician at the hospital due to the multiple falls the pt has had and now she is home with her family and needs this.

## 2014-05-26 NOTE — Telephone Encounter (Signed)
Malori from [facility] called to ask for verbal orders to continue with her ot . 2  weeks 1 times a week, 4 weeks twice a week, 1 week once a week,  For upper ext home exercise program ADLS and home safety   [facility] phone number . [phone]

## 2014-05-26 NOTE — Patient Instructions (Addendum)
1.  Continue Aricept 2.  Try to do crossword puzzles or brain teasers at least 30 minutes a day. 3.  Stop drinking or eating for 3 to 4 hours before going to bed.  Continue to drink fluids during the day to prevent dehydration. 4.  Follow up in 9 months for [facility]

## 2014-05-26 NOTE — Telephone Encounter (Signed)
Ok , will eval at her appt.

## 2014-05-27 ENCOUNTER — Telehealth: Payer: Self-pay | Admitting: *Deleted

## 2014-05-27 NOTE — Telephone Encounter (Signed)
Malori called from [facility] and I advised her the PT was approved by Dr Maudie Mercury and she will be evaluated at her appt on 11/16.

## 2014-06-01 ENCOUNTER — Other Ambulatory Visit: Payer: Self-pay | Admitting: Family Medicine

## 2014-06-02 ENCOUNTER — Ambulatory Visit (INDEPENDENT_AMBULATORY_CARE_PROVIDER_SITE_OTHER): Payer: Medicare Other | Admitting: Family Medicine

## 2014-06-02 ENCOUNTER — Encounter: Payer: Self-pay | Admitting: Family Medicine

## 2014-06-02 VITALS — BP 108/72 | HR 68 | Temp 97.4°F | Ht 66.0 in | Wt 122.1 lb

## 2014-06-02 DIAGNOSIS — E785 Hyperlipidemia, unspecified: Secondary | ICD-10-CM

## 2014-06-02 DIAGNOSIS — F0391 Unspecified dementia with behavioral disturbance: Secondary | ICD-10-CM

## 2014-06-02 DIAGNOSIS — J449 Chronic obstructive pulmonary disease, unspecified: Secondary | ICD-10-CM

## 2014-06-02 DIAGNOSIS — C50919 Malignant neoplasm of unspecified site of unspecified female breast: Secondary | ICD-10-CM

## 2014-06-02 DIAGNOSIS — I10 Essential (primary) hypertension: Secondary | ICD-10-CM

## 2014-06-02 DIAGNOSIS — R609 Edema, unspecified: Secondary | ICD-10-CM

## 2014-06-02 DIAGNOSIS — I480 Paroxysmal atrial fibrillation: Secondary | ICD-10-CM

## 2014-06-02 DIAGNOSIS — K219 Gastro-esophageal reflux disease without esophagitis: Secondary | ICD-10-CM

## 2014-06-02 MED ORDER — FUROSEMIDE 40 MG PO TABS: 40.0000 mg | ORAL_TABLET | Freq: Every day | ORAL | Status: DC

## 2014-06-02 NOTE — Patient Instructions (Signed)
Follow up in 3 months

## 2014-06-02 NOTE — Progress Notes (Signed)
HPI:  Follow up:  Caroline Underwood is a pleasant 80 yo F with a complicated PMH sig for Alzheimers dementia, A. Flutter, HTN, AAA s/p repair, HLD, CKD, PAD, COPD, Hypothyroidism, GERD, iron def anemia and breast cancer here for follow up.  HTN/HLD/PAD/HX A. Flutter:  -followed by cards - labs checked recently -reviewed last cards notes -appreciate care -continues eliquis, norvasc, lasix - held in hospital but daughter reports she taking 40 mg daily - reports needs refill, metorpolol, lipitor  -denies: CP, SOB, DOE, palpitations, edema   Hypothyoroid:  -on synthroid 37.64mcg daily - stable, checked in last 3 months -denies: hot/cold intolerance, constipation  Dementia:  -on aricept, followed by Dr. Tomi Likens in neurology  -recent hospitalization from ? Fall, notes reviewed, had pneumothorax -doing better, getting PT at home - a little sore from PT  CKI:  -stable with baseline cr around 1.7, improved last check - cards checked this recently  COPD:  -used advair daily  -spontaneous pneumothorax with hospitalization in 04/23/2014  SKIN: -several lesions on arms and face -doesn't know if changing or new -does not want surgery or to see dermatology No itching or pain  Breast cancer:  -followed by onc, triple neg s/p surgical intervention  -daughter reports she has called the cancer center on numerous occassions, leaves messages to schedule appt and nobody returns her calls    ROS: See pertinent positives and negatives per HPI.  Past Medical History  Diagnosis Date  . Status post AAA (abdominal aortic aneurysm) repair 01/02/2013  . PAD (peripheral artery disease) 01/02/2013  . Osteoporosis, unspecified 01/02/2013  . Hyperlipemia 01/02/2013  . Hx of atrial flutter, on Xarelto 01/02/2013  . Essential hypertension, benign 01/02/2013  . Chronic kidney disease 01/02/2013    stage 3/4  . Breast cancer 01/02/2013  . COPD (chronic obstructive pulmonary disease) 01/02/2013  .  Dementia, on Namenda briefly in the past per review of records 01/02/2013  . GERD (gastroesophageal reflux disease) 01/02/2013  . Iron deficiency anemia 01/02/2013  . Unspecified hypothyroidism 01/02/2013    Past Surgical History  Procedure Laterality Date  . Abdominal aortic aneurysm repair    . Breast surgery      right lumpectomy    Family History  Problem Relation Age of Onset  . Cancer Mother     lung  . Heart disease Father 6    History   Social History  . Marital Status: Widowed    Spouse Name: N/A    Number of Children: N/A  . Years of Education: N/A   Social History Main Topics  . Smoking status: Former Smoker -- 0.50 packs/day    Types: Cigarettes    Quit date: 02/16/2008  . Smokeless tobacco: Never Used  . Alcohol Use: No  . Drug Use: No  . Sexual Activity: No   Other Topics Concern  . None   Social History Narrative   Home Situation: living with daughter [name])      Spiritual Beliefs: none      Lifestyle: get around well in the house - uses a walker sometimes, has not had a history of any falls, has some mild dementia. She needs help with bathing. She needs some help with dressing. She does not do her own cooking or cleaning. Does not drive. She did manage all of her finances until 10/2012.             Current outpatient prescriptions: amLODipine (NORVASC) 5 MG tablet, Take 1 tablet (5 mg total) by mouth  daily., Disp: 90 tablet, Rfl: 3;  apixaban (ELIQUIS) 2.5 MG TABS tablet, Take 1 tablet (2.5 mg total) by mouth 2 (two) times daily., Disp: 60 tablet, Rfl: 5;  atorvastatin (LIPITOR) 20 MG tablet, TAKE 1 TABLET BY MOUTH EVERY DAY AT 6PM, Disp: 90 tablet, Rfl: 0 cholecalciferol (VITAMIN D) 1000 UNITS tablet, Take 1,000 Units by mouth once a week. On tuesday, Disp: , Rfl: ;  donepezil (ARICEPT) 10 MG tablet, Take one tablet (10 mg) by mouth at bedtime., Disp: 90 tablet, Rfl: 3;  Fluticasone-Salmeterol (ADVAIR) 100-50 MCG/DOSE AEPB, Inhale 1 puff into the  lungs every 12 (twelve) hours., Disp: , Rfl:  levothyroxine (LEVOTHROID) 25 MCG tablet, Take 1.5 tablets (37.5 mcg total) by mouth daily before breakfast., Disp: 90 tablet, Rfl: 5;  Melatonin 3 MG TABS, Take 3 mg by mouth at bedtime., Disp: , Rfl: ;  metoprolol (LOPRESSOR) 50 MG tablet, Take 1 tablet (50 mg total) by mouth 2 (two) times daily., Disp: , Rfl: ;  vitamin C (ASCORBIC ACID) 500 MG tablet, Take 500 mg by mouth daily., Disp: , Rfl:  furosemide (LASIX) 40 MG tablet, Take 1 tablet (40 mg total) by mouth daily., Disp: 90 tablet, Rfl: 1  EXAM:  Filed Vitals:   06/02/14 1550  BP: 108/72  Pulse: 68  Temp: 97.4 F (36.3 C)    Body mass index is 19.72 kg/(m^2).  GENERAL: vitals reviewed and listed above, alert, oriented, appears well hydrated and in no acute distress  HEENT: atraumatic, conjunttiva clear, no obvious abnormalities on inspection of external nose and ears  NECK: no obvious masses on inspection  LUNGS: clear to auscultation bilaterally, no wheezes, rales or rhonchi, good air movement  CV: HRRR, no peripheral edema  MS: moves all extremities without noticeable abnormality  PSYCH: pleasant and cooperative, no obvious depression or anxiety  ASSESSMENT AND PLAN:  Discussed the following assessment and plan:  Essential hypertension, benign  Paroxysmal atrial fibrillation  Hyperlipemia  Breast cancer, unspecified laterality  Gastroesophageal reflux disease, esophagitis presence not specified  Dementia, with behavioral disturbance  Chronic obstructive pulmonary disease, unspecified COPD, unspecified chronic bronchitis type  Edema - Plan: furosemide (LASIX) 40 MG tablet  -reviewed and clarified recs from other providers -refilled her lasix as daughter reports she has been taking it and get swelling if does not take - she will check with cards -doing well otherwise -follow up in 3 months -Patient advised to return or notify a doctor immediately if symptoms  worsen or persist or new concerns arise.  Patient Instructions  Follow up in 3 months     Tameron Lama, [facility] R.

## 2014-06-02 NOTE — Progress Notes (Signed)
Pre visit review using our clinic review tool, if applicable. No additional management support is needed unless otherwise documented below in the visit note. 

## 2014-06-03 ENCOUNTER — Telehealth: Payer: Self-pay | Admitting: Cardiovascular Disease

## 2014-06-03 NOTE — Telephone Encounter (Signed)
New message     Daughter calling wants to discuss recent PCP visit from yesterday.    Should patient continue to take  Furosemide

## 2014-06-03 NOTE — Telephone Encounter (Signed)
Left message for patient's daughter to call the office.

## 2014-06-03 NOTE — Telephone Encounter (Signed)
Spoke with patient's daughter who called to ask if patient should continue to take Lasix.  She states the patient was taking it prior to hospitalization in October and when discharged was told not to take it.  Patient saw her PCP yesterday who advised her to take Lasix.  I advised daughter that Dr. Acie Fredrickson agrees and that patient does not have to take the Lasix in the absence of edema.  Patient's daughter verbalized understanding and agreement.

## 2014-06-08 ENCOUNTER — Other Ambulatory Visit: Payer: Self-pay | Admitting: Family Medicine

## 2014-06-20 ENCOUNTER — Other Ambulatory Visit: Payer: Self-pay | Admitting: Family Medicine

## 2014-06-24 DIAGNOSIS — M160 Bilateral primary osteoarthritis of hip: Secondary | ICD-10-CM | POA: Diagnosis not present

## 2014-06-24 DIAGNOSIS — I129 Hypertensive chronic kidney disease with stage 1 through stage 4 chronic kidney disease, or unspecified chronic kidney disease: Secondary | ICD-10-CM | POA: Diagnosis not present

## 2014-06-24 DIAGNOSIS — J439 Emphysema, unspecified: Secondary | ICD-10-CM | POA: Diagnosis not present

## 2014-06-24 DIAGNOSIS — N183 Chronic kidney disease, stage 3 (moderate): Secondary | ICD-10-CM

## 2014-06-24 DIAGNOSIS — M6281 Muscle weakness (generalized): Secondary | ICD-10-CM | POA: Diagnosis not present

## 2014-06-27 ENCOUNTER — Telehealth: Payer: Self-pay | Admitting: Family Medicine

## 2014-06-27 ENCOUNTER — Ambulatory Visit (INDEPENDENT_AMBULATORY_CARE_PROVIDER_SITE_OTHER): Payer: Medicare Other | Admitting: *Deleted

## 2014-06-27 DIAGNOSIS — Z23 Encounter for immunization: Secondary | ICD-10-CM

## 2014-06-27 NOTE — Telephone Encounter (Signed)
I left a detailed message with the message below on Caroline Underwood's cell number.

## 2014-06-27 NOTE — Telephone Encounter (Signed)
Patient's daughter states patient has a cold and would like to know if patient can get the PNA shot?? Patient daughter states it is okay to leave a detailed message on her voicemail.

## 2014-06-27 NOTE — Telephone Encounter (Signed)
Ok to get unless feeling really bad, fevers, etc.

## 2014-07-09 ENCOUNTER — Telehealth: Payer: Self-pay | Admitting: Family Medicine

## 2014-07-09 NOTE — Telephone Encounter (Signed)
I left a message for Caroline Underwood with Caroline Underwood to return my call.

## 2014-07-09 NOTE — Telephone Encounter (Signed)
Can we please have therapist do eval and determine if further therapy needed and if so fax order for this. Thanks.

## 2014-07-09 NOTE — Telephone Encounter (Signed)
Pt's therapy is due to end next week, and joann would like to add an additional visit this week and next week for family training. Pt has made great progress and she wants to ensure this continues.

## 2014-07-14 NOTE — Telephone Encounter (Signed)
I called Joann and left a detailed message with the verbal order for continued PT and to fax the order to my attn for Dr Maudie Mercury.

## 2014-07-14 NOTE — Telephone Encounter (Signed)
Arville Go states she is the therapist and needs to have a verbal order from Dr Maudie Mercury to type up the evaluation.  States the pt is already under their care and has done well and at one point the pts son was not available and they wanted to have him available during the visits also.

## 2014-07-14 NOTE — Telephone Encounter (Signed)
Ok - verbal order for eval for need for continued PT. Thanks.

## 2014-07-24 ENCOUNTER — Ambulatory Visit (INDEPENDENT_AMBULATORY_CARE_PROVIDER_SITE_OTHER): Payer: Medicare Other | Admitting: *Deleted

## 2014-07-24 ENCOUNTER — Other Ambulatory Visit (INDEPENDENT_AMBULATORY_CARE_PROVIDER_SITE_OTHER): Payer: Medicare Other | Admitting: *Deleted

## 2014-07-24 ENCOUNTER — Ambulatory Visit: Payer: Medicare Other | Admitting: Pharmacist

## 2014-07-24 DIAGNOSIS — N183 Chronic kidney disease, stage 3 unspecified: Secondary | ICD-10-CM

## 2014-07-24 DIAGNOSIS — I480 Paroxysmal atrial fibrillation: Secondary | ICD-10-CM

## 2014-07-24 LAB — CBC WITH DIFFERENTIAL/PLATELET
BASOS PCT: 0.4 % (ref 0.0–3.0)
Basophils Absolute: 0.1 10*3/uL (ref 0.0–0.1)
EOS ABS: 0.6 10*3/uL (ref 0.0–0.7)
Eosinophils Relative: 5.1 % — ABNORMAL HIGH (ref 0.0–5.0)
HCT: 39.8 % (ref 36.0–46.0)
Hemoglobin: 13.1 g/dL (ref 12.0–15.0)
LYMPHS PCT: 16.2 % (ref 12.0–46.0)
Lymphs Abs: 2 10*3/uL (ref 0.7–4.0)
MCHC: 33.1 g/dL (ref 30.0–36.0)
MCV: 92.1 fl (ref 78.0–100.0)
Monocytes Absolute: 0.8 10*3/uL (ref 0.1–1.0)
Monocytes Relative: 6.7 % (ref 3.0–12.0)
Neutro Abs: 8.8 10*3/uL — ABNORMAL HIGH (ref 1.4–7.7)
Neutrophils Relative %: 71.6 % (ref 43.0–77.0)
Platelets: 226 10*3/uL (ref 150.0–400.0)
RBC: 4.32 Mil/uL (ref 3.87–5.11)
RDW: 13.5 % (ref 11.5–15.5)
WBC: 12.2 10*3/uL — ABNORMAL HIGH (ref 4.0–10.5)

## 2014-07-24 LAB — BASIC METABOLIC PANEL
BUN: 20 mg/dL (ref 6–23)
CO2: 28 meq/L (ref 19–32)
Calcium: 9.3 mg/dL (ref 8.4–10.5)
Chloride: 100 mEq/L (ref 96–112)
Creatinine, Ser: 1.5 mg/dL — ABNORMAL HIGH (ref 0.4–1.2)
GFR: 35.19 mL/min — ABNORMAL LOW (ref >60.00)
GLUCOSE: 96 mg/dL (ref 70–99)
POTASSIUM: 3.9 meq/L (ref 3.5–5.1)
SODIUM: 138 meq/L (ref 135–145)

## 2014-07-24 NOTE — Progress Notes (Signed)
Pt was started on Eliquis 2.5mg   every 12 hours for atrial Fib on 05/20/2014.    Reviewed patients medication list.  Pt is not currently on any combined P-gp and strong CYP3A4 inhibitors/inducers (ketoconazole, traconazole, ritonavir, carbamazepine, phenytoin, rifampin, St. John's wort).  Reviewed labs.  SCr 1.5 , Weight. 55.9kg  Dose is appropriate based on labs Hgb and HCT are 13.1/39.8.   A full discussion of the nature of anticoagulants has been carried out.  A benefit/risk analysis has been presented to the patient, so that they understand the justification for choosing anticoagulation with Eliquis at this time.  The need for compliance is stressed.  Pt is aware to take the medication twice daily.  Side effects of potential bleeding are discussed, including unusual colored urine or stools, coughing up blood or coffee ground emesis, nose bleeds or serious fall or head trauma.  Discussed signs and symptoms of stroke. The patient should avoid any OTC items containing aspirin or ibuprofen.  Avoid alcohol consumption.   Call if any signs of abnormal bleeding.  Discussed financial obligations and pt states is not having any problems  in obtaining medication.  Next lab test test in 6 months. Daughter aware of lab results and continue current dosage.  Spoke with pt and she states has had no sign of symptom of bleeding or stroke. Has not missed any doses of Eliquis .

## 2014-07-25 ENCOUNTER — Telehealth: Payer: Self-pay | Admitting: *Deleted

## 2014-07-25 NOTE — Telephone Encounter (Signed)
s/w daughter and pt in regards to lab reuslts with verbal understanding, needs DPR to be filled out.

## 2014-08-22 ENCOUNTER — Ambulatory Visit (INDEPENDENT_AMBULATORY_CARE_PROVIDER_SITE_OTHER): Payer: Medicare Other | Admitting: Cardiovascular Disease

## 2014-08-22 ENCOUNTER — Encounter: Payer: Self-pay | Admitting: Cardiovascular Disease

## 2014-08-22 VITALS — BP 128/70 | HR 60 | Ht 66.0 in | Wt 129.4 lb

## 2014-08-22 DIAGNOSIS — I10 Essential (primary) hypertension: Secondary | ICD-10-CM

## 2014-08-22 DIAGNOSIS — I480 Paroxysmal atrial fibrillation: Secondary | ICD-10-CM

## 2014-08-22 NOTE — Progress Notes (Signed)
Cardiology Office Note   Date:  08/22/2014   ID:  Caroline Underwood, DOB [birth year], MRN [id number]  PCP:  Lucretia Kern., DO  Cardiologist:   Thayer Headings, MD   Chief Complaint  Patient presents with  . Follow-up    HTN   Problems List:  History of atrial flutter 2. Status post abdominal aortic aneurysm repair 3. Hyperlipidemia 4. COPD 5. Breast cancer - Dx May, 2014 6. Chronic kidney disease 7. Dementia 8. Hypertension   History of Present Illness: Caroline Underwood is a 81 y.o. female who presents for follow up of her  Atrial flutter. AAA repair, HTN, CKD . She fell at the end of last year.   Because of her risk of falling , the Xarelto was changed to Eliquis.     Past Medical History  Diagnosis Date  . Status post AAA (abdominal aortic aneurysm) repair 01/02/2013  . PAD (peripheral artery disease) 01/02/2013  . Osteoporosis, unspecified 01/02/2013  . Hyperlipemia 01/02/2013  . Hx of atrial flutter, on Xarelto 01/02/2013  . Essential hypertension, benign 01/02/2013  . Chronic kidney disease 01/02/2013    stage 3/4  . Breast cancer 01/02/2013  . COPD (chronic obstructive pulmonary disease) 01/02/2013  . Dementia, on Namenda briefly in the past per review of records 01/02/2013  . GERD (gastroesophageal reflux disease) 01/02/2013  . Iron deficiency anemia 01/02/2013  . Unspecified hypothyroidism 01/02/2013    Past Surgical History  Procedure Laterality Date  . Abdominal aortic aneurysm repair    . Breast surgery      right lumpectomy     Current Outpatient Prescriptions  Medication Sig Dispense Refill  . ADVAIR DISKUS 100-50 MCG/DOSE AEPB INHALE 1 PUFF INTO THE LUNGS EVERY 12 (TWELVE) HOURS. 60 each 5  . amLODipine (NORVASC) 5 MG tablet Take 1 tablet (5 mg total) by mouth daily. 90 tablet 3  . apixaban (ELIQUIS) 2.5 MG TABS tablet Take 1 tablet (2.5 mg total) by mouth 2 (two) times daily. 60 tablet 5  . atorvastatin (LIPITOR) 20 MG tablet TAKE 1 TABLET BY MOUTH  EVERY DAY AT 6PM 90 tablet 0  . cholecalciferol (VITAMIN D) 1000 UNITS tablet Take 1,000 Units by mouth once a week. On tuesday    . donepezil (ARICEPT) 10 MG tablet Take one tablet (10 mg) by mouth at bedtime. 90 tablet 3  . furosemide (LASIX) 40 MG tablet Take 1 tablet (40 mg total) by mouth daily. 90 tablet 1  . levothyroxine (LEVOTHROID) 25 MCG tablet Take 1.5 tablets (37.5 mcg total) by mouth daily before breakfast. 90 tablet 5  . Melatonin 3 MG TABS Take 3 mg by mouth at bedtime.    . metoprolol (LOPRESSOR) 50 MG tablet Take 1 tablet (50 mg total) by mouth 2 (two) times daily.    . vitamin C (ASCORBIC ACID) 500 MG tablet Take 500 mg by mouth daily.     No current facility-administered medications for this visit.    Allergies:   Review of patient's allergies indicates no known allergies.    Social History:  The patient  reports that she quit smoking about 6 years ago. Her smoking use included Cigarettes. She smoked 0.50 packs per day. She has never used smokeless tobacco. She reports that she does not drink alcohol or use illicit drugs.   Family History:  The patient's family history includes Cancer in her mother; Heart disease (age of onset: 67) in her father.    ROS:  Please see the history of  present illness.    Review of Systems: Constitutional:  denies fever, chills, diaphoresis, appetite change and fatigue.  HEENT: denies photophobia, eye pain, redness, hearing loss, ear pain, congestion, sore throat, rhinorrhea, sneezing, neck pain, neck stiffness and tinnitus.  Respiratory: denies SOB, DOE, cough, chest tightness, and wheezing.  Cardiovascular: denies chest pain, palpitations and leg swelling.  Gastrointestinal: denies nausea, vomiting, abdominal pain, diarrhea, constipation, blood in stool.  Genitourinary: denies dysuria, urgency, frequency, hematuria, flank pain and difficulty urinating.  Musculoskeletal: denies  myalgias, back pain, joint swelling, arthralgias and gait  problem.   Skin: denies pallor, rash and wound.  Neurological: denies dizziness, seizures, syncope, weakness, light-headedness, numbness and headaches.   Hematological: denies adenopathy, easy bruising, personal or family bleeding history.  Psychiatric/ Behavioral: denies suicidal ideation, mood changes, confusion, nervousness, sleep disturbance and agitation.       All other systems are reviewed and negative.    PHYSICAL EXAM: VS:  BP 178/80 mmHg  Pulse 60  Ht 5\' 6"  (1.676 m)  Wt 129 lb 6.4 oz (58.695 kg)  BMI 20.90 kg/m2 , BMI Body mass index is 20.9 kg/(m^2). GEN: Well nourished, well developed, in no acute distress HEENT: normal Neck: no JVD, carotid bruits, or masses Cardiac: RRR; no murmurs, rubs, or gallops,no edema  Respiratory:  clear to auscultation bilaterally, normal work of breathing GI: soft, nontender, nondistended, + BS MS: no deformity or atrophy Skin: warm and dry, no rash Neuro:  Strength and sensation are intact Psych: normal   EKG:  EKG is not ordered today.    Recent Labs: 03/18/2014: TSH 0.56 04/26/2014: ALT 12 07/24/2014: BUN 20; Creatinine 1.5*; Hemoglobin 13.1; Platelets 226.0; Potassium 3.9; Sodium 138    Lipid Panel    Component Value Date/Time   CHOL 158 11/19/2013 1324   TRIG 165.0* 11/19/2013 1324   HDL 56.80 11/19/2013 1324   CHOLHDL 3 11/19/2013 1324   VLDL 33.0 11/19/2013 1324   LDLCALC 68 11/19/2013 1324      Wt Readings from Last 3 Encounters:  08/22/14 129 lb 6.4 oz (58.695 kg)  06/02/14 122 lb 1.6 oz (55.384 kg)  05/26/14 123 lb 12.8 oz (56.155 kg)      Other studies Reviewed: Additional studies/ records that were reviewed today include: . Review of the above records demonstrates:    ASSESSMENT AND PLAN:  1.  History of atrial flutter-  She seems to be doing well.  Continue on current dose of Eliquis. 2. Status post abdominal aortic aneurysm repair - stable  3. Hyperlipidemia 4. COPD 5. Breast cancer - Dx May,  2014 6. Chronic kidney disease 7. Dementia 8. Hypertension -  Her blood pressures fairly well-controlled.   Current medicines are reviewed at length with the patient today.  The patient does not have concerns regarding medicines.  The following changes have been made:  no change   Disposition:   FU with me in 6 months.     Signed, Isbella Arline, Wonda Cheng, MD  08/22/2014 3:59 PM    [facility] [place], [place],   [address] Phone: [phone]; Fax: [phone]

## 2014-08-22 NOTE — Patient Instructions (Signed)
Your physician recommends that you continue on your current medications as directed. Please refer to the Current Medication list given to you today.  Your physician wants you to follow-up in: 6 months with Dr. Nahser.  You will receive a reminder letter in the mail two months in advance. If you don't receive a letter, please call our office to schedule the follow-up appointment.  

## 2014-08-23 ENCOUNTER — Other Ambulatory Visit: Payer: Self-pay | Admitting: Family Medicine

## 2014-09-11 ENCOUNTER — Ambulatory Visit (INDEPENDENT_AMBULATORY_CARE_PROVIDER_SITE_OTHER): Payer: Medicare Other | Admitting: Family Medicine

## 2014-09-11 ENCOUNTER — Encounter: Payer: Self-pay | Admitting: Family Medicine

## 2014-09-11 VITALS — BP 138/72 | HR 56 | Temp 97.5°F | Ht 66.0 in | Wt 127.9 lb

## 2014-09-11 DIAGNOSIS — I10 Essential (primary) hypertension: Secondary | ICD-10-CM

## 2014-09-11 DIAGNOSIS — I480 Paroxysmal atrial fibrillation: Secondary | ICD-10-CM

## 2014-09-11 DIAGNOSIS — E038 Other specified hypothyroidism: Secondary | ICD-10-CM

## 2014-09-11 DIAGNOSIS — N183 Chronic kidney disease, stage 3 unspecified: Secondary | ICD-10-CM

## 2014-09-11 DIAGNOSIS — E785 Hyperlipidemia, unspecified: Secondary | ICD-10-CM

## 2014-09-11 DIAGNOSIS — F0280 Dementia in other diseases classified elsewhere without behavioral disturbance: Secondary | ICD-10-CM

## 2014-09-11 DIAGNOSIS — G309 Alzheimer's disease, unspecified: Secondary | ICD-10-CM

## 2014-09-11 LAB — TSH: TSH: 2.21 u[IU]/mL (ref 0.35–4.50)

## 2014-09-11 NOTE — Progress Notes (Signed)
HPI:  Caroline Underwood is a pleasant 81 yo F with a complicated PMH sig for Alzheimers dementia, A. Flutter, HTN, AAA s/p repair, HLD, CKD, PAD, COPD, Hypothyroidism, GERD, iron def anemia and breast cancer here for follow up.  HTN/HLD/PAD/HX A. Flutter:  -followed by cards - labs checked recently -reviewed last cards notes -appreciate care -continues eliquis, norvasc, lasix, metoprolol, lipitor  -denies: CP, SOB, DOE, palpitations, edema   Hypothyoroid:  -on synthroid 37.53mcg daily - stable, checked in last 3 months -denies: hot/cold intolerance, constipation  Dementia:  -on aricept, followed by Dr. Tomi Likens in neurology   CKI:  -stable with baseline cr around 1.5, improved last check - cards checked this recently  COPD:  -used advair daily  -spontaneous pneumothorax with hospitalization in 04/23/2014  Breast cancer:  -followed by onc, triple neg s/p surgical intervention    ROS: See pertinent positives and negatives per HPI.  Past Medical History  Diagnosis Date  . Status post AAA (abdominal aortic aneurysm) repair 01/02/2013  . PAD (peripheral artery disease) 01/02/2013  . Osteoporosis, unspecified 01/02/2013  . Hyperlipemia 01/02/2013  . Hx of atrial flutter, on Xarelto 01/02/2013  . Essential hypertension, benign 01/02/2013  . Chronic kidney disease 01/02/2013    stage 3/4  . Breast cancer 01/02/2013  . COPD (chronic obstructive pulmonary disease) 01/02/2013  . Dementia, on Namenda briefly in the past per review of records 01/02/2013  . GERD (gastroesophageal reflux disease) 01/02/2013  . Iron deficiency anemia 01/02/2013  . Unspecified hypothyroidism 01/02/2013    Past Surgical History  Procedure Laterality Date  . Abdominal aortic aneurysm repair    . Breast surgery      right lumpectomy    Family History  Problem Relation Age of Onset  . Cancer Mother     lung  . Heart disease Father 29    History   Social History  . Marital Status: Widowed   Spouse Name: N/A  . Number of Children: N/A  . Years of Education: N/A   Social History Main Topics  . Smoking status: Former Smoker -- 0.50 packs/day    Types: Cigarettes    Quit date: 02/16/2008  . Smokeless tobacco: Never Used  . Alcohol Use: No  . Drug Use: No  . Sexual Activity: No   Other Topics Concern  . None   Social History Narrative   Home Situation: living with daughter [name])      Spiritual Beliefs: none      Lifestyle: get around well in the house - uses a walker sometimes, has not had a history of any falls, has some mild dementia. She needs help with bathing. She needs some help with dressing. She does not do her own cooking or cleaning. Does not drive. She did manage all of her finances until 10/2012.              Current outpatient prescriptions:  .  ADVAIR DISKUS 100-50 MCG/DOSE AEPB, INHALE 1 PUFF INTO THE LUNGS EVERY 12 (TWELVE) HOURS., Disp: 60 each, Rfl: 5 .  amLODipine (NORVASC) 5 MG tablet, Take 1 tablet (5 mg total) by mouth daily., Disp: 90 tablet, Rfl: 3 .  apixaban (ELIQUIS) 2.5 MG TABS tablet, Take 1 tablet (2.5 mg total) by mouth 2 (two) times daily., Disp: 60 tablet, Rfl: 5 .  atorvastatin (LIPITOR) 20 MG tablet, TAKE 1 TABLET BY MOUTH EVERY DAY AT 6PM, Disp: 90 tablet, Rfl: 0 .  cholecalciferol (VITAMIN D) 1000 UNITS tablet, Take 1,000 Units by mouth  once a week. On tuesday, Disp: , Rfl:  .  donepezil (ARICEPT) 10 MG tablet, Take one tablet (10 mg) by mouth at bedtime., Disp: 90 tablet, Rfl: 3 .  furosemide (LASIX) 40 MG tablet, Take 1 tablet (40 mg total) by mouth daily., Disp: 90 tablet, Rfl: 1 .  levothyroxine (LEVOTHROID) 25 MCG tablet, Take 1.5 tablets (37.5 mcg total) by mouth daily before breakfast., Disp: 90 tablet, Rfl: 5 .  Melatonin 3 MG TABS, Take 3 mg by mouth at bedtime., Disp: , Rfl:  .  metoprolol (LOPRESSOR) 50 MG tablet, Take 1 tablet (50 mg total) by mouth 2 (two) times daily., Disp: , Rfl:  .  vitamin C (ASCORBIC ACID) 500 MG  tablet, Take 500 mg by mouth daily., Disp: , Rfl:   EXAM:  Filed Vitals:   09/11/14 0849  BP: 138/72  Pulse: 56  Temp: 97.5 F (36.4 C)    Body mass index is 20.65 kg/(m^2).  GENERAL: vitals reviewed and listed above, alert, oriented, appears well hydrated and in no acute distress  HEENT: atraumatic, conjunttiva clear, no obvious abnormalities on inspection of external nose and ears  NECK: no obvious masses on inspection  LUNGS: clear to auscultation bilaterally, no wheezes, rales or rhonchi, good air movement  CV: HRRR, no peripheral edema  MS: moves all extremities without noticeable abnormality  PSYCH: pleasant and cooperative, no obvious depression or anxiety  ASSESSMENT AND PLAN:  Discussed the following assessment and plan:  Essential hypertension, benign  Other specified hypothyroidism - Plan: TSH  Alzheimer's disease  Paroxysmal atrial fibrillation  Hyperlipemia  Chronic kidney disease, stage 3 (moderate)  -FASTING lipids - pt and caregiver refused, not fasting and do not want to return for this -TSH, other labs by cardiologist reviewed -infuenza advised - refused -doing well - stable -Patient advised to return or notify a doctor immediately if symptoms worsen or persist or new concerns arise.  Patient Instructions  Before you leave: -Labs -morning appt in 4-6 months     Adit Riddles R.

## 2014-09-11 NOTE — Patient Instructions (Addendum)
Before you leave: -Labs -morning appt in 4-6 months

## 2014-09-11 NOTE — Progress Notes (Signed)
Pre visit review using our clinic review tool, if applicable. No additional management support is needed unless otherwise documented below in the visit note. 

## 2014-10-18 ENCOUNTER — Other Ambulatory Visit: Payer: Self-pay | Admitting: Family Medicine

## 2014-10-18 ENCOUNTER — Other Ambulatory Visit: Payer: Self-pay | Admitting: Physician Assistant

## 2014-11-13 ENCOUNTER — Other Ambulatory Visit: Payer: Self-pay | Admitting: Physician Assistant

## 2014-11-15 ENCOUNTER — Other Ambulatory Visit: Payer: Self-pay | Admitting: Physician Assistant

## 2014-11-23 ENCOUNTER — Other Ambulatory Visit: Payer: Self-pay | Admitting: Physician Assistant

## 2014-11-29 ENCOUNTER — Other Ambulatory Visit: Payer: Self-pay | Admitting: Family Medicine

## 2014-11-29 ENCOUNTER — Other Ambulatory Visit: Payer: Self-pay | Admitting: Physician Assistant

## 2014-12-13 ENCOUNTER — Other Ambulatory Visit: Payer: Self-pay | Admitting: Family Medicine

## 2015-01-22 ENCOUNTER — Ambulatory Visit (INDEPENDENT_AMBULATORY_CARE_PROVIDER_SITE_OTHER): Payer: Medicare Other | Admitting: *Deleted

## 2015-01-22 DIAGNOSIS — I4891 Unspecified atrial fibrillation: Secondary | ICD-10-CM | POA: Diagnosis not present

## 2015-01-22 LAB — CBC
HEMATOCRIT: 40.5 % (ref 36.0–46.0)
Hemoglobin: 13.6 g/dL (ref 12.0–15.0)
MCHC: 33.7 g/dL (ref 30.0–36.0)
MCV: 92.9 fl (ref 78.0–100.0)
Platelets: 177 10*3/uL (ref 150.0–400.0)
RBC: 4.36 Mil/uL (ref 3.87–5.11)
RDW: 12.9 % (ref 11.5–15.5)
WBC: 11.4 10*3/uL — ABNORMAL HIGH (ref 4.0–10.5)

## 2015-01-22 LAB — BASIC METABOLIC PANEL
BUN: 33 mg/dL — ABNORMAL HIGH (ref 6–23)
CHLORIDE: 101 meq/L (ref 96–112)
CO2: 29 mEq/L (ref 19–32)
Calcium: 9.5 mg/dL (ref 8.4–10.5)
Creatinine, Ser: 1.59 mg/dL — ABNORMAL HIGH (ref 0.40–1.20)
GFR: 32.36 mL/min — ABNORMAL LOW (ref >60.00)
GLUCOSE: 84 mg/dL (ref 70–99)
Potassium: 3.6 mEq/L (ref 3.5–5.1)
Sodium: 139 mEq/L (ref 135–145)

## 2015-01-22 NOTE — Progress Notes (Signed)
Pt was started on Eliquis 2.5mg  every 12 hours  for Atrial Fib on 05/30/2014.    Reviewed patients medication list.  Pt is not currently on any combined P-gp and strong CYP3A4 inhibitors/inducers (ketoconazole, traconazole, ritonavir, carbamazepine, phenytoin, rifampin, St. John's wort).  Reviewed labs.  SCr 1.59 , Weight 62.27kg, .  Dose is appropriate based on age  weight and labs.   Hgb 13.6 and HCT 40.5  A full discussion of the nature of anticoagulants has been carried out.  A benefit/risk analysis has been presented to the patient, so that they understand the justification for choosing anticoagulation with Eliquis at this time.  The need for compliance is stressed.  Pt is aware to take the medication twice daily.  Side effects of potential bleeding are discussed, including unusual colored urine or stools, coughing up blood or coffee ground emesis, nose bleeds or serious fall or head trauma.  Discussed signs and symptoms of stroke. The patient should avoid any OTC items containing aspirin or ibuprofen.  Avoid alcohol consumption.   Call if any signs of abnormal bleeding.  Discussed financial obligations and states is not having any problems in obtaining medication.  Next lab test test in 6 months.  Pt states has had no bleeding and no sign or symptom of stroke. Has not missed any doses of Eliquis and has had no new medications nor changes in her health Will do CBC and BMET today and will call with results  01/23/2015 Spoke with pt's daughter and instructed that she is on the correct dose of Eliquis based on her age ,weight, and labs  and to continue this same dose   Appt made for recheck in 6 months and pt'daughter  states understanding.

## 2015-01-24 ENCOUNTER — Other Ambulatory Visit: Payer: Self-pay | Admitting: Family Medicine

## 2015-01-27 ENCOUNTER — Ambulatory Visit: Payer: Medicare Other | Admitting: Family Medicine

## 2015-02-02 ENCOUNTER — Ambulatory Visit: Payer: Medicare Other | Admitting: Family Medicine

## 2015-02-12 ENCOUNTER — Other Ambulatory Visit: Payer: Self-pay | Admitting: Neurology

## 2015-02-17 ENCOUNTER — Encounter: Payer: Self-pay | Admitting: *Deleted

## 2015-02-24 ENCOUNTER — Ambulatory Visit (INDEPENDENT_AMBULATORY_CARE_PROVIDER_SITE_OTHER): Payer: Medicare Other | Admitting: Neurology

## 2015-02-24 ENCOUNTER — Encounter: Payer: Self-pay | Admitting: Neurology

## 2015-02-24 ENCOUNTER — Other Ambulatory Visit: Payer: Self-pay | Admitting: Family Medicine

## 2015-02-24 VITALS — BP 134/62 | HR 60 | Resp 18 | Ht 66.0 in | Wt 140.3 lb

## 2015-02-24 DIAGNOSIS — G309 Alzheimer's disease, unspecified: Secondary | ICD-10-CM

## 2015-02-24 DIAGNOSIS — F0280 Dementia in other diseases classified elsewhere without behavioral disturbance: Secondary | ICD-10-CM

## 2015-02-24 NOTE — Progress Notes (Signed)
NEUROLOGY FOLLOW UP OFFICE NOTE  Caroline Underwood [id number]  HISTORY OF PRESENT ILLNESS: Caroline Underwood is a 81 y.o. female with hypertension, peripheral artery disease, chronic kidney disease, iron deficiency anemia, COPD, atrial fibrillation and history of right breast cancer who follows up for Alzheimer's disease. She is accompanied by her daughter and son.    UPDATE: She is taking Aricept 10mg  daily.   She is still living with her daughter and son.  Her son is home with her 24 hours.  She is able to dress herself, use the toilet and bathe herself, however her daughter will stand by when she bathes.  She needs reminding to take her medications.  She sleeps well.  She takes melatonin.  Sometimes she moans in her sleep but she does not recall any dreams.  She uses a walker.  Her mood is good.   She does not have any hallucinations, delusions, or change in personality.  Appetite is good.  She denies depression.  She will watch TV or sit out on the porch with her son and talk.  Her daughter notes that short term memory is worse.  She has to check the calendar several times within a half hour to remind herself the date.  She only recognizes people she sees daily.   HISTORY: She began experiencing symptoms many years ago. She was having problems with memory and often repeating questions, as well as forgetting tasks and misplacing objects. At some point, several years ago, she was started on Namenda by her primary care physician, but discontinued it at the request of her son. There has been a progressive decline in her condition. Her decline has significantly worsened over the past year, particularly following episodes of acute renal failure and hypertensive urgency.  She developed an episode of altered mental status, where she was walking around naked and then was laying on the couch for 2 days. She was also found to have a flutter and was started on Xarelto.  Her confusion has improved and she is  functioning better, but definitely worse than before. She has good days and bad days.  She has always been able to pay her bills, up until last year.   She currently takes Aricept 10mg  daily.  She was unable to afford Namenda.  PAST MEDICAL HISTORY: Past Medical History  Diagnosis Date  . Status post AAA (abdominal aortic aneurysm) repair 01/02/2013  . PAD (peripheral artery disease) 01/02/2013  . Osteoporosis, unspecified 01/02/2013  . Hyperlipemia 01/02/2013  . Hx of atrial flutter, on Xarelto 01/02/2013  . Essential hypertension, benign 01/02/2013  . Chronic kidney disease 01/02/2013    stage 3/4  . Breast cancer 01/02/2013  . COPD (chronic obstructive pulmonary disease) 01/02/2013  . Dementia, on Namenda briefly in the past per review of records 01/02/2013  . GERD (gastroesophageal reflux disease) 01/02/2013  . Iron deficiency anemia 01/02/2013  . Unspecified hypothyroidism 01/02/2013    MEDICATIONS: Current Outpatient Prescriptions on File Prior to Visit  Medication Sig Dispense Refill  . ADVAIR DISKUS 100-50 MCG/DOSE AEPB INHALE 1 PUFF INTO THE LUNGS EVERY 12 (TWELVE) HOURS. 60 each 2  . amLODipine (NORVASC) 5 MG tablet Take 1 tablet (5 mg total) by mouth daily. 90 tablet 3  . cholecalciferol (VITAMIN D) 1000 UNITS tablet Take 1,000 Units by mouth once a week. On tuesday    . donepezil (ARICEPT) 10 MG tablet TAKE 1 TABLET BY MOUTH AT BEDTIME 90 tablet 3  . ELIQUIS 2.5 MG TABS tablet TAKE  1 TABLET (2.5 MG TOTAL) BY MOUTH 2 (TWO) TIMES DAILY. 60 tablet 5  . furosemide (LASIX) 40 MG tablet TAKE 1 TABLET BY MOUTH EVERY DAY 90 tablet 1  . furosemide (LASIX) 40 MG tablet TAKE 1 TABLET BY MOUTH EVERY DAY 90 tablet 0  . levothyroxine (SYNTHROID, LEVOTHROID) 25 MCG tablet TAKE 1&1/2 TABLETS BY MOUTH EVERY MORNING BEFORE BREAKFAST 90 tablet 5  . Melatonin 3 MG TABS Take 3 mg by mouth at bedtime.    . metoprolol (LOPRESSOR) 50 MG tablet Take 1 tablet (50 mg total) by mouth 2 (two) times daily.    .  vitamin C (ASCORBIC ACID) 500 MG tablet Take 500 mg by mouth daily.     No current facility-administered medications on file prior to visit.    ALLERGIES: No Known Allergies  FAMILY HISTORY: Family History  Problem Relation Age of Onset  . Cancer Mother     lung  . Heart disease Father 26    SOCIAL HISTORY: History   Social History  . Marital Status: Widowed    Spouse Name: N/A  . Number of Children: N/A  . Years of Education: N/A   Occupational History  . Not on file.   Social History Main Topics  . Smoking status: Former Smoker -- 0.50 packs/day    Types: Cigarettes    Quit date: 02/16/2008  . Smokeless tobacco: Never Used  . Alcohol Use: No  . Drug Use: No  . Sexual Activity: No   Other Topics Concern  . Not on file   Social History Narrative   Home Situation: living with daughter [name])      Spiritual Beliefs: none      Lifestyle: get around well in the house - uses a walker sometimes, has not had a history of any falls, has some mild dementia. She needs help with bathing. She needs some help with dressing. She does not do her own cooking or cleaning. Does not drive. She did manage all of her finances until 10/2012.             REVIEW OF SYSTEMS: Constitutional: No fevers, chills, or sweats, no generalized fatigue, change in appetite Eyes: No visual changes, double vision, eye pain Ear, nose and throat: No hearing loss, ear pain, nasal congestion, sore throat Cardiovascular: No chest pain, palpitations Respiratory:  No shortness of breath at rest or with exertion, wheezes GastrointestinaI: No nausea, vomiting, diarrhea, abdominal pain, fecal incontinence Genitourinary:  No dysuria, urinary retention or frequency Musculoskeletal:  No neck pain, back pain Integumentary: No rash, pruritus, skin lesions Neurological: as above Psychiatric: No depression, insomnia, anxiety Endocrine: No palpitations, fatigue, diaphoresis, mood swings, change in appetite,  change in weight, increased thirst Hematologic/Lymphatic:  No anemia, purpura, petechiae. Allergic/Immunologic: no itchy/runny eyes, nasal congestion, recent allergic reactions, rashes  PHYSICAL EXAM: Filed Vitals:   02/24/15 0918  BP: 134/62  Pulse: 60  Resp: 18   General: No acute distress.  Patient appears well-groomed.  Head:  Normocephalic/atraumatic Eyes:  Fundi not visualized on fundoscopic exam Neck: supple, no paraspinal tenderness, full range of motion Heart:  Regular rate and rhythm Lungs:  Clear to auscultation bilaterally Back: No paraspinal tenderness Neurological Exam: alert and oriented to person, place, and time. Attention span and concentration intact, remote memory poor, remote memory intact, fund of knowledge intact.  Speech fluent and not dysarthric, language intact.    Montreal Cognitive Assessment  02/24/2015 02/24/2015 05/26/2014  Visuospatial/ Executive (0/5) 2 2 2   Naming (0/3)  2 2 2   Attention: Read list of digits (0/2) 2 2 2   Attention: Read list of letters (0/1) 1 1 1   Attention: Serial 7 subtraction starting at 100 (0/3) 3 3 3   Language: Repeat phrase (0/2) 2 2 2   Language : Fluency (0/1) 0 0 0  Abstraction (0/2) 2 2 1   Delayed Recall (0/5) 0 0 0  Orientation (0/6) 0 0 3  Total 14 14 16   Adjusted Score (based on education) 14 14 16    Pupils pinpoint but round and equal.  CN II-XII intact. Fundi not visualized.  Bulk and tone normal, muscle strength 5/5 throughout.  Sensation to light touch  intact.  Deep tendon reflexes 2+ throughout.  Finger to nose intact.  Gait cautious with short strides.  IMPRESSION: Alzheimer's disease  PLAN: 1.  Continue Aricept 10mg  daily 2.  24 hour supervision 3.  Use of assistance at all times when ambulating (walker or accompanied by somebody) 4.  Maintain regular schedule (sleep hours and meals) 5.  Follow up in 9 months  30 minutes spent face to face with patient, over 50% spent discussing management.  Metta Clines,  DO  CC:  Colin Benton, DO

## 2015-02-24 NOTE — Progress Notes (Signed)
Cardiology Office Note   Date:  02/24/2015   ID:  Caroline Underwood, DOB January 15, 1922, MRN [id number]  PCP:  Lucretia Kern., DO  Cardiologist:   Thayer Headings, MD   Chief Complaint  Patient presents with  . Follow-up    atrial flutter   Problems List:  1.  History of atrial flutter 2. Status post abdominal aortic aneurysm repair 3. Hyperlipidemia 4. COPD 5. Breast cancer - Dx May, 2014 6. Chronic kidney disease 7. Dementia 8. Hypertension   History of Present Illness: Caroline Underwood is a 81 y.o. female who presents for follow up of her  Atrial flutter. AAA repair, HTN, CKD . She fell at the end of last year.   Because of her risk of falling , the Xarelto was changed to Eliquis.   February 25, 2015:   Doing well. BP has been normal. No CP or dyspnea,. Is eating well.  Has gained 11 lbs since her last visit.    Past Medical History  Diagnosis Date  . Status post AAA (abdominal aortic aneurysm) repair 01/02/2013  . PAD (peripheral artery disease) 01/02/2013  . Osteoporosis, unspecified 01/02/2013  . Hyperlipemia 01/02/2013  . Hx of atrial flutter, on Xarelto 01/02/2013  . Essential hypertension, benign 01/02/2013  . Chronic kidney disease 01/02/2013    stage 3/4  . Breast cancer 01/02/2013  . COPD (chronic obstructive pulmonary disease) 01/02/2013  . Dementia, on Namenda briefly in the past per review of records 01/02/2013  . GERD (gastroesophageal reflux disease) 01/02/2013  . Iron deficiency anemia 01/02/2013  . Unspecified hypothyroidism 01/02/2013    Past Surgical History  Procedure Laterality Date  . Abdominal aortic aneurysm repair    . Breast surgery      right lumpectomy     Current Outpatient Prescriptions  Medication Sig Dispense Refill  . ADVAIR DISKUS 100-50 MCG/DOSE AEPB INHALE 1 PUFF INTO THE LUNGS EVERY 12 (TWELVE) HOURS. 60 each 2  . amLODipine (NORVASC) 5 MG tablet Take 1 tablet (5 mg total) by mouth daily. 90 tablet 3  . atorvastatin (LIPITOR) 20  MG tablet TAKE 1 TABLET BY MOUTH EVERY DAY AT 6PM 90 tablet 0  . cholecalciferol (VITAMIN D) 1000 UNITS tablet Take 1,000 Units by mouth once a week. On tuesday    . donepezil (ARICEPT) 10 MG tablet TAKE 1 TABLET BY MOUTH AT BEDTIME 90 tablet 3  . ELIQUIS 2.5 MG TABS tablet TAKE 1 TABLET (2.5 MG TOTAL) BY MOUTH 2 (TWO) TIMES DAILY. 60 tablet 5  . furosemide (LASIX) 40 MG tablet TAKE 1 TABLET BY MOUTH EVERY DAY 90 tablet 1  . furosemide (LASIX) 40 MG tablet TAKE 1 TABLET BY MOUTH EVERY DAY 90 tablet 0  . levothyroxine (SYNTHROID, LEVOTHROID) 25 MCG tablet TAKE 1&1/2 TABLETS BY MOUTH EVERY MORNING BEFORE BREAKFAST 90 tablet 5  . Melatonin 3 MG TABS Take 3 mg by mouth at bedtime.    . metoprolol (LOPRESSOR) 50 MG tablet Take 1 tablet (50 mg total) by mouth 2 (two) times daily.    . vitamin C (ASCORBIC ACID) 500 MG tablet Take 500 mg by mouth daily.     No current facility-administered medications for this visit.    Allergies:   Review of patient's allergies indicates no known allergies.    Social History:  The patient  reports that she quit smoking about 7 years ago. Her smoking use included Cigarettes. She smoked 0.50 packs per day. She has never used smokeless tobacco. She reports that  she does not drink alcohol or use illicit drugs.   Family History:  The patient's family history includes Cancer in her mother; Heart disease (age of onset: 48) in her father.    ROS:  Please see the history of present illness.    Review of Systems: Constitutional:  denies fever, chills, diaphoresis, appetite change and fatigue.  HEENT: denies photophobia, eye pain, redness, hearing loss, ear pain, congestion, sore throat, rhinorrhea, sneezing, neck pain, neck stiffness and tinnitus.  Respiratory: denies SOB, DOE, cough, chest tightness, and wheezing.  Cardiovascular: denies chest pain, palpitations and leg swelling.  Gastrointestinal: denies nausea, vomiting, abdominal pain, diarrhea, constipation, blood  in stool.  Genitourinary: denies dysuria, urgency, frequency, hematuria, flank pain and difficulty urinating.  Musculoskeletal: denies  myalgias, back pain, joint swelling, arthralgias and gait problem.   Skin: denies pallor, rash and wound.  Neurological: denies dizziness, seizures, syncope, weakness, light-headedness, numbness and headaches.   Hematological: denies adenopathy, easy bruising, personal or family bleeding history.  Psychiatric/ Behavioral: denies suicidal ideation, mood changes, confusion, nervousness, sleep disturbance and agitation.       All other systems are reviewed and negative.    PHYSICAL EXAM: VS:  There were no vitals taken for this visit. , BMI There is no weight on file to calculate BMI. GEN: Well nourished, well developed, in no acute distress HEENT: normal Neck: no JVD, carotid bruits, or masses Cardiac: RRR; no murmurs, rubs, or gallops,no edema  Respiratory:  clear to auscultation bilaterally, normal work of breathing GI: soft, nontender, nondistended, + BS MS: no deformity or atrophy Skin: warm and dry, no rash Neuro:  Strength and sensation are intact Psych: normal   EKG:  EKG is not ordered today.    Recent Labs: 04/26/2014: ALT 12 09/11/2014: TSH 2.21 01/22/2015: BUN 33*; Creatinine, Ser 1.59*; Hemoglobin 13.6; Platelets 177.0; Potassium 3.6; Sodium 139    Lipid Panel    Component Value Date/Time   CHOL 158 11/19/2013 1324   TRIG 165.0* 11/19/2013 1324   HDL 56.80 11/19/2013 1324   CHOLHDL 3 11/19/2013 1324   VLDL 33.0 11/19/2013 1324   LDLCALC 68 11/19/2013 1324      Wt Readings from Last 3 Encounters:  02/24/15 63.64 kg (140 lb 4.8 oz)  09/11/14 58.015 kg (127 lb 14.4 oz)  08/22/14 58.695 kg (129 lb 6.4 oz)      Other studies Reviewed: Additional studies/ records that were reviewed today include: . Review of the above records demonstrates:    ASSESSMENT AND PLAN:  1.  History of atrial flutter-  She seems to be doing  well.  Continue on current dose of Eliquis.  She is maintaining normal sinus rhythm. 2. Status post abdominal aortic aneurysm repair - stable  3. Hyperlipidemia 4. COPD 5. Breast cancer - Dx May, 2014 6. Chronic kidney disease 7. Dementia 8. Hypertension -  Her blood pressures fairly well-controlled.  Continue current medications.   Current medicines are reviewed at length with the patient today.  The patient does not have concerns regarding medicines.  The following changes have been made:  no change   Disposition:   FU with me in one year   Signed, Alexiss Iturralde, Wonda Cheng, MD  02/24/2015 1:11 PM    [address], [place], [place]  [address] Phone: [phone]; Fax: [phone]

## 2015-02-24 NOTE — Patient Instructions (Signed)
Continue Aricept 10mg  daily 24 hour supervision Maintain regular schedule regarding sleeping hours and meals Use walker or holding on to somebody at all times when walking Follow up in 9 months

## 2015-02-25 ENCOUNTER — Ambulatory Visit (INDEPENDENT_AMBULATORY_CARE_PROVIDER_SITE_OTHER): Payer: Medicare Other | Admitting: Podiatry

## 2015-02-25 ENCOUNTER — Encounter: Payer: Self-pay | Admitting: Cardiovascular Disease

## 2015-02-25 ENCOUNTER — Ambulatory Visit (INDEPENDENT_AMBULATORY_CARE_PROVIDER_SITE_OTHER): Payer: Medicare Other | Admitting: Cardiovascular Disease

## 2015-02-25 ENCOUNTER — Encounter: Payer: Self-pay | Admitting: Podiatry

## 2015-02-25 VITALS — BP 170/70 | HR 54 | Ht 66.0 in | Wt 140.2 lb

## 2015-02-25 VITALS — BP 146/70 | HR 58 | Resp 16

## 2015-02-25 DIAGNOSIS — B351 Tinea unguium: Secondary | ICD-10-CM

## 2015-02-25 DIAGNOSIS — M79676 Pain in unspecified toe(s): Secondary | ICD-10-CM

## 2015-02-25 DIAGNOSIS — I480 Paroxysmal atrial fibrillation: Secondary | ICD-10-CM | POA: Diagnosis not present

## 2015-02-25 DIAGNOSIS — I739 Peripheral vascular disease, unspecified: Secondary | ICD-10-CM

## 2015-02-25 NOTE — Patient Instructions (Signed)
Medication Instructions:  Your physician recommends that you continue on your current medications as directed. Please refer to the Current Medication list given to you today.   Labwork: None Ordered   Testing/Procedures: None Ordered   Follow-Up: Your physician wants you to follow-up in: 1 year with Dr. Nahser.  You will receive a reminder letter in the mail two months in advance. If you don't receive a letter, please call our office to schedule the follow-up appointment.      

## 2015-02-25 NOTE — Progress Notes (Signed)
Patient ID: Caroline Underwood, female   DOB: 24-Dec-1921, 81 y.o.   MRN: [id number] Complaint:  Visit Type: Patient returns to my office for continued preventative foot care services. Complaint: Patient states" my nails have grown long and thick and become painful to walk and wear shoes" . The patient presents for preventative foot care services. No changes to ROS  Podiatric Exam: Vascular: dorsalis pedis and posterior tibial pulses are not  palpable bilateral. Capillary return is immediate. Cold feet noted.  Sensorium: Normal Semmes Weinstein monofilament test. Normal tactile sensation bilaterally. Nail Exam: Pt has thick disfigured discolored nails with subungual debris noted bilateral entire nail hallux through fifth toenails Ulcer Exam: There is no evidence of ulcer or pre-ulcerative changes or infection. Orthopedic Exam: Muscle tone and strength are WNL. No limitations in general ROM. No crepitus or effusions noted. Foot type and digits show no abnormalities. Bony prominences are unremarkable. Skin: No Porokeratosis. No infection or ulcers  Diagnosis:  Onychomycosis, , Pain in right toe, pain in left toes  Treatment & Plan Procedures and Treatment: Consent by patient was obtained for treatment procedures. The patient understood the discussion of treatment and procedures well. All questions were answered thoroughly reviewed. Debridement of mycotic and hypertrophic toenails, 1 through 5 bilateral and clearing of subungual debris. No ulceration, no infection noted.  Return Visit-Office Procedure: Patient instructed to return to the office for a follow up visit 3 months for continued evaluation and treatment.

## 2015-03-05 ENCOUNTER — Ambulatory Visit: Payer: Medicare Other | Admitting: Family Medicine

## 2015-03-07 ENCOUNTER — Other Ambulatory Visit: Payer: Self-pay | Admitting: Family Medicine

## 2015-03-17 ENCOUNTER — Other Ambulatory Visit: Payer: Self-pay | Admitting: Family Medicine

## 2015-03-31 ENCOUNTER — Encounter: Payer: Self-pay | Admitting: Family Medicine

## 2015-03-31 ENCOUNTER — Ambulatory Visit (INDEPENDENT_AMBULATORY_CARE_PROVIDER_SITE_OTHER): Payer: Medicare Other | Admitting: Family Medicine

## 2015-03-31 VITALS — BP 110/82 | HR 56 | Temp 98.1°F | Ht 66.0 in | Wt 142.5 lb

## 2015-03-31 DIAGNOSIS — N183 Chronic kidney disease, stage 3 unspecified: Secondary | ICD-10-CM

## 2015-03-31 DIAGNOSIS — I739 Peripheral vascular disease, unspecified: Secondary | ICD-10-CM | POA: Diagnosis not present

## 2015-03-31 DIAGNOSIS — F0280 Dementia in other diseases classified elsewhere without behavioral disturbance: Secondary | ICD-10-CM

## 2015-03-31 DIAGNOSIS — I10 Essential (primary) hypertension: Secondary | ICD-10-CM

## 2015-03-31 DIAGNOSIS — I480 Paroxysmal atrial fibrillation: Secondary | ICD-10-CM | POA: Diagnosis not present

## 2015-03-31 DIAGNOSIS — E039 Hypothyroidism, unspecified: Secondary | ICD-10-CM | POA: Diagnosis not present

## 2015-03-31 DIAGNOSIS — G309 Alzheimer's disease, unspecified: Secondary | ICD-10-CM

## 2015-03-31 DIAGNOSIS — E785 Hyperlipidemia, unspecified: Secondary | ICD-10-CM | POA: Diagnosis not present

## 2015-03-31 DIAGNOSIS — E038 Other specified hypothyroidism: Secondary | ICD-10-CM

## 2015-03-31 LAB — TSH: TSH: 2.6 u[IU]/mL (ref 0.35–4.50)

## 2015-03-31 NOTE — Progress Notes (Signed)
Pre visit review using our clinic review tool, if applicable. No additional management support is needed unless otherwise documented below in the visit note. 

## 2015-03-31 NOTE — Progress Notes (Signed)
HPI:  Caroline Underwood is a pleasant 81 yo F with a complicated PMH sig for Alzheimers dementia, A. Flutter, HTN, AAA s/p repair, HLD, CKD, PAD, COPD, Hypothyroidism, GERD, iron def anemia and breast cancer here for follow up.  HTN/HLD/PAD/HX A. Flutter/s/p aortic aneurysm repair:  -followed by cardiologist (Dr. Acie Fredrickson)- labs checked recently -reviewed last cards notes -appreciate care -continues eliquis, norvasc, lasix, metoprolol, lipitor  -denies: CP, SOB, DOE, palpitations, edema   Hypothyoroid:  -on synthroid 37.62mcg daily - stable last check -denies: hot/cold intolerance, constipation  Dementia:  -on aricept, followed by Dr. Tomi Likens in neurology - recent notes reviewed, appreciate recs -feels safe at home - has supervision, walking with assistance, needs parking decal  CKI/Anemia:  -stable with baseline cr around 1.5, stable last check - cards checked this recently  COPD:  -used advair daily  -spontaneous pneumothorax with hospitalization in 04/23/2014  Breast cancer:  -followed by onc, triple neg s/p surgical intervention    ROS: See pertinent positives and negatives per HPI.  Past Medical History  Diagnosis Date  . Status post AAA (abdominal aortic aneurysm) repair 01/02/2013  . PAD (peripheral artery disease) 01/02/2013  . Osteoporosis, unspecified 01/02/2013  . Hyperlipemia 01/02/2013  . Hx of atrial flutter, on Xarelto 01/02/2013  . Essential hypertension, benign 01/02/2013  . Chronic kidney disease 01/02/2013    stage 3/4  . Breast cancer 01/02/2013  . COPD (chronic obstructive pulmonary disease) 01/02/2013  . Dementia, on Namenda briefly in the past per review of records 01/02/2013  . GERD (gastroesophageal reflux disease) 01/02/2013  . Iron deficiency anemia 01/02/2013  . Unspecified hypothyroidism 01/02/2013    Past Surgical History  Procedure Laterality Date  . Abdominal aortic aneurysm repair    . Breast surgery      right lumpectomy    Family  History  Problem Relation Age of Onset  . Cancer Mother     lung  . Heart disease Father 39    Social History   Social History  . Marital Status: Widowed    Spouse Name: N/A  . Number of Children: N/A  . Years of Education: N/A   Social History Main Topics  . Smoking status: Former Smoker -- 0.50 packs/day    Types: Cigarettes    Quit date: 02/16/2008  . Smokeless tobacco: Never Used  . Alcohol Use: No  . Drug Use: No  . Sexual Activity: No   Other Topics Concern  . None   Social History Narrative   Home Situation: living with daughter [name])      Spiritual Beliefs: none      Lifestyle: get around well in the house - uses a walker sometimes, has not had a history of any falls, has some mild dementia. She needs help with bathing. She needs some help with dressing. She does not do her own cooking or cleaning. Does not drive. She did manage all of her finances until 10/2012.              Current outpatient prescriptions:  .  ADVAIR DISKUS 100-50 MCG/DOSE AEPB, INHALE 1 PUFF INTO THE LUNGS EVERY 12 (TWELVE) HOURS., Disp: 60 each, Rfl: 2 .  amLODipine (NORVASC) 5 MG tablet, Take 1 tablet (5 mg total) by mouth daily., Disp: 90 tablet, Rfl: 3 .  atorvastatin (LIPITOR) 20 MG tablet, TAKE 1 TABLET BY MOUTH EVERY DAY AT 6PM, Disp: 90 tablet, Rfl: 0 .  atorvastatin (LIPITOR) 20 MG tablet, TAKE 1 TABLET BY MOUTH EVERY DAY AT 6PM,  Disp: 90 tablet, Rfl: 0 .  cholecalciferol (VITAMIN D) 1000 UNITS tablet, Take 1,000 Units by mouth once a week. On tuesday, Disp: , Rfl:  .  donepezil (ARICEPT) 10 MG tablet, TAKE 1 TABLET BY MOUTH AT BEDTIME, Disp: 90 tablet, Rfl: 3 .  ELIQUIS 2.5 MG TABS tablet, TAKE 1 TABLET (2.5 MG TOTAL) BY MOUTH 2 (TWO) TIMES DAILY., Disp: 60 tablet, Rfl: 5 .  furosemide (LASIX) 40 MG tablet, TAKE 1 TABLET BY MOUTH EVERY DAY, Disp: 90 tablet, Rfl: 1 .  levothyroxine (SYNTHROID, LEVOTHROID) 25 MCG tablet, TAKE 1&1/2 TABLETS BY MOUTH EVERY MORNING BEFORE BREAKFAST,  Disp: 90 tablet, Rfl: 5 .  Melatonin 3 MG TABS, Take 3 mg by mouth at bedtime., Disp: , Rfl:  .  metoprolol (LOPRESSOR) 50 MG tablet, Take 1 tablet (50 mg total) by mouth 2 (two) times daily., Disp: , Rfl:  .  vitamin C (ASCORBIC ACID) 500 MG tablet, Take 500 mg by mouth daily., Disp: , Rfl:   EXAM:  Filed Vitals:   03/31/15 1341  BP: 110/82  Pulse: 56  Temp: 98.1 F (36.7 C)    Body mass index is 23.01 kg/(m^2).  GENERAL: vitals reviewed and listed above, alert, oriented, appears well hydrated and in no acute distress  HEENT: atraumatic, conjunttiva clear, no obvious abnormalities on inspection of external nose and ears  NECK: no obvious masses on inspection  LUNGS: clear to auscultation bilaterally, no wheezes, rales or rhonchi, good air movement  CV: HRRR, tr peripheral edema L > R  MS: moves all extremities without noticeable abnormality  PSYCH: pleasant and cooperative, no obvious depression or anxiety  ASSESSMENT AND PLAN:  Discussed the following assessment and plan:  Essential hypertension, benign  Paroxysmal atrial fibrillation  Hyperlipemia  PAD (peripheral artery disease)  Chronic kidney disease, stage 3 (moderate)  Other specified hypothyroidism  Alzheimer's disease  Hypothyroidism, unspecified hypothyroidism type - Plan: TSH  -TSH -cont current med -flu shot -follow up in 4-6 months -parking decal -Patient advised to return or notify a doctor immediately if symptoms worsen or persist or new concerns arise.  Patient Instructions  BEFORE YOU LEAVE: -flu shot -parking decal -labs -follow up in 4-6 months     Kiesha Ensey R.

## 2015-03-31 NOTE — Patient Instructions (Signed)
BEFORE YOU LEAVE: -flu shot -parking decal -labs -follow up in 4-6 months

## 2015-04-21 ENCOUNTER — Ambulatory Visit (INDEPENDENT_AMBULATORY_CARE_PROVIDER_SITE_OTHER): Payer: Medicare Other | Admitting: *Deleted

## 2015-04-21 DIAGNOSIS — Z23 Encounter for immunization: Secondary | ICD-10-CM

## 2015-04-25 ENCOUNTER — Other Ambulatory Visit: Payer: Self-pay | Admitting: Family Medicine

## 2015-05-14 ENCOUNTER — Other Ambulatory Visit: Payer: Self-pay | Admitting: Cardiovascular Disease

## 2015-05-14 ENCOUNTER — Other Ambulatory Visit: Payer: Self-pay | Admitting: Family Medicine

## 2015-05-18 ENCOUNTER — Other Ambulatory Visit: Payer: Self-pay | Admitting: Cardiovascular Disease

## 2015-05-19 ENCOUNTER — Telehealth: Payer: Self-pay

## 2015-05-19 NOTE — Telephone Encounter (Signed)
Patient's caregiver called stating they could not afford $400 for her Eliquis.  I provided 4 weeks of samples and paperwork for the patient assistance program.

## 2015-05-27 ENCOUNTER — Ambulatory Visit: Payer: Medicare Other | Admitting: Podiatry

## 2015-05-28 ENCOUNTER — Encounter: Payer: Self-pay | Admitting: Podiatry

## 2015-05-28 ENCOUNTER — Ambulatory Visit (INDEPENDENT_AMBULATORY_CARE_PROVIDER_SITE_OTHER): Payer: Medicare Other | Admitting: Podiatry

## 2015-05-28 DIAGNOSIS — B351 Tinea unguium: Secondary | ICD-10-CM | POA: Diagnosis not present

## 2015-05-28 DIAGNOSIS — I739 Peripheral vascular disease, unspecified: Secondary | ICD-10-CM

## 2015-05-28 DIAGNOSIS — M79676 Pain in unspecified toe(s): Secondary | ICD-10-CM

## 2015-05-28 NOTE — Progress Notes (Signed)
Patient ID: Caroline Underwood, female   DOB: 1921-11-14, 81 y.o.   MRN: [id number] Complaint:  Visit Type: Patient returns to my office for continued preventative foot care services. Complaint: Patient states" my nails have grown long and thick and become painful to walk and wear shoes" . The patient presents for preventative foot care services. No changes to ROS  Podiatric Exam: Vascular: dorsalis pedis and posterior tibial pulses are not  palpable bilateral. Capillary return is immediate. Cold feet noted.  Sensorium: Normal Semmes Weinstein monofilament test. Normal tactile sensation bilaterally. Nail Exam: Pt has thick disfigured discolored nails with subungual debris noted bilateral entire nail hallux through fifth toenails Ulcer Exam: There is no evidence of ulcer or pre-ulcerative changes or infection. Orthopedic Exam: Muscle tone and strength are WNL. No limitations in general ROM. No crepitus or effusions noted. Foot type and digits show no abnormalities. Bony prominences are unremarkable. Skin: No Porokeratosis. No infection or ulcers  Diagnosis:  Onychomycosis, , Pain in right toe, pain in left toes  Treatment & Plan Procedures and Treatment: Consent by patient was obtained for treatment procedures. The patient understood the discussion of treatment and procedures well. All questions were answered thoroughly reviewed. Debridement of mycotic and hypertrophic toenails, 1 through 5 bilateral and clearing of subungual debris. No ulceration, no infection noted.  Return Visit-Office Procedure: Patient instructed to return to the office for a follow up visit 3 months for continued evaluation and treatment.

## 2015-06-08 ENCOUNTER — Other Ambulatory Visit: Payer: Self-pay

## 2015-06-08 MED ORDER — APIXABAN 2.5 MG PO TABS: 2.5000 mg | ORAL_TABLET | Freq: Two times a day (BID) | ORAL | Status: DC

## 2015-06-16 ENCOUNTER — Other Ambulatory Visit: Payer: Self-pay | Admitting: Family Medicine

## 2015-07-22 ENCOUNTER — Ambulatory Visit (INDEPENDENT_AMBULATORY_CARE_PROVIDER_SITE_OTHER): Payer: Medicare Other | Admitting: Pharmacist

## 2015-07-22 VITALS — Wt 145.0 lb

## 2015-07-22 DIAGNOSIS — I4891 Unspecified atrial fibrillation: Secondary | ICD-10-CM | POA: Diagnosis not present

## 2015-07-22 LAB — CBC
HCT: 39.9 % (ref 36.0–46.0)
Hemoglobin: 13.4 g/dL (ref 12.0–15.0)
MCH: 30.9 pg (ref 26.0–34.0)
MCHC: 33.6 g/dL (ref 30.0–36.0)
MCV: 91.9 fL (ref 78.0–100.0)
MPV: 10.3 fL (ref 8.6–12.4)
Platelets: 180 10*3/uL (ref 150–400)
RBC: 4.34 MIL/uL (ref 3.87–5.11)
RDW: 13.3 % (ref 11.5–15.5)
WBC: 11 10*3/uL — ABNORMAL HIGH (ref 4.0–10.5)

## 2015-07-22 LAB — BASIC METABOLIC PANEL
BUN: 26 mg/dL — AB (ref 7–25)
CALCIUM: 9.4 mg/dL (ref 8.6–10.4)
CO2: 26 mmol/L (ref 20–31)
Chloride: 100 mmol/L (ref 98–110)
Creat: 1.76 mg/dL — ABNORMAL HIGH (ref 0.60–0.88)
GLUCOSE: 81 mg/dL (ref 65–99)
Potassium: 3.5 mmol/L (ref 3.5–5.3)
Sodium: 139 mmol/L (ref 135–146)

## 2015-07-23 NOTE — Progress Notes (Signed)
Pt was started on Eliquis 2.5mg  every 12 hours  for Atrial Fib on 05/30/2014.    Reviewed patients medication list.  Pt is not currently on any combined P-gp and strong CYP3A4 inhibitors/inducers (ketoconazole, traconazole, ritonavir, carbamazepine, phenytoin, rifampin, St. John's wort).  Reviewed labs.  SCr 1.76 , Weight 65kg, .  Dose is appropriate based on age  weight and labs.   Hgb 13.4 and HCT 39.9  A full discussion of the nature of anticoagulants has been carried out.  A benefit/risk analysis has been presented to the patient, so that they understand the justification for choosing anticoagulation with Eliquis at this time.  The need for compliance is stressed.  Pt is aware to take the medication twice daily.  Side effects of potential bleeding are discussed, including unusual colored urine or stools, coughing up blood or coffee ground emesis, nose bleeds or serious fall or head trauma.  Discussed signs and symptoms of stroke. The patient should avoid any OTC items containing aspirin or ibuprofen.  Avoid alcohol consumption.   Call if any signs of abnormal bleeding.  Discussed financial obligations and states is not having any problems in obtaining medication.  Next lab test test in 6 months.  Pt states has had no bleeding and no sign or symptom of stroke. Has not missed any doses of Eliquis and has had no new medications nor changes in her health.  She did have some issues with donut hole at the end of the year, but copay is now affordable with the start of the new year.  She is due to see Dr. Acie Fredrickson in August and will repeat blood work at that time.

## 2015-08-27 ENCOUNTER — Encounter: Payer: Self-pay | Admitting: Podiatry

## 2015-08-27 ENCOUNTER — Ambulatory Visit (INDEPENDENT_AMBULATORY_CARE_PROVIDER_SITE_OTHER): Payer: Medicare Other | Admitting: Podiatry

## 2015-08-27 ENCOUNTER — Ambulatory Visit: Payer: Medicare Other | Admitting: Podiatry

## 2015-08-27 DIAGNOSIS — M79676 Pain in unspecified toe(s): Secondary | ICD-10-CM

## 2015-08-27 DIAGNOSIS — B351 Tinea unguium: Secondary | ICD-10-CM | POA: Diagnosis not present

## 2015-08-27 DIAGNOSIS — I739 Peripheral vascular disease, unspecified: Secondary | ICD-10-CM

## 2015-08-27 NOTE — Progress Notes (Signed)
Patient ID: Caroline Underwood, female   DOB: 1922/05/18, 82 y.o.   MRN: [id number] Complaint:  Visit Type: Patient returns to my office for continued preventative foot care services. Complaint: Patient states" my nails have grown long and thick and become painful to walk and wear shoes" . The patient presents for preventative foot care services. No changes to ROS  Podiatric Exam: Vascular: dorsalis pedis and posterior tibial pulses are not  palpable bilateral. Capillary return is immediate. Cold feet noted.  Sensorium: Normal Semmes Weinstein monofilament test. Normal tactile sensation bilaterally. Nail Exam: Pt has thick disfigured discolored nails with subungual debris noted bilateral entire nail hallux through fifth toenails Ulcer Exam: There is no evidence of ulcer or pre-ulcerative changes or infection. Orthopedic Exam: Muscle tone and strength are WNL. No limitations in general ROM. No crepitus or effusions noted. Foot type and digits show no abnormalities. Bony prominences are unremarkable. Skin: No Porokeratosis. No infection or ulcers  Diagnosis:  Onychomycosis, , Pain in right toe, pain in left toes  Treatment & Plan Procedures and Treatment: Consent by patient was obtained for treatment procedures. The patient understood the discussion of treatment and procedures well. All questions were answered thoroughly reviewed. Debridement of mycotic and hypertrophic toenails, 1 through 5 bilateral and clearing of subungual debris. No ulceration, no infection noted.  Return Visit-Office Procedure: Patient instructed to return to the office for a follow up visit 3 months for continued evaluation and treatment.   Gardiner Barefoot DPM

## 2015-09-01 ENCOUNTER — Ambulatory Visit: Payer: Medicare Other | Admitting: Family Medicine

## 2015-09-01 ENCOUNTER — Encounter: Payer: Self-pay | Admitting: Family Medicine

## 2015-09-01 ENCOUNTER — Ambulatory Visit (INDEPENDENT_AMBULATORY_CARE_PROVIDER_SITE_OTHER): Payer: Medicare Other | Admitting: Family Medicine

## 2015-09-01 VITALS — BP 134/80 | HR 63 | Temp 97.8°F | Ht 66.0 in | Wt 147.5 lb

## 2015-09-01 DIAGNOSIS — J449 Chronic obstructive pulmonary disease, unspecified: Secondary | ICD-10-CM

## 2015-09-01 DIAGNOSIS — N183 Chronic kidney disease, stage 3 unspecified: Secondary | ICD-10-CM

## 2015-09-01 DIAGNOSIS — I480 Paroxysmal atrial fibrillation: Secondary | ICD-10-CM

## 2015-09-01 DIAGNOSIS — E785 Hyperlipidemia, unspecified: Secondary | ICD-10-CM | POA: Diagnosis not present

## 2015-09-01 DIAGNOSIS — I10 Essential (primary) hypertension: Secondary | ICD-10-CM | POA: Diagnosis not present

## 2015-09-01 DIAGNOSIS — E038 Other specified hypothyroidism: Secondary | ICD-10-CM

## 2015-09-01 DIAGNOSIS — F0280 Dementia in other diseases classified elsewhere without behavioral disturbance: Secondary | ICD-10-CM

## 2015-09-01 DIAGNOSIS — I739 Peripheral vascular disease, unspecified: Secondary | ICD-10-CM

## 2015-09-01 DIAGNOSIS — G309 Alzheimer's disease, unspecified: Secondary | ICD-10-CM

## 2015-09-01 NOTE — Progress Notes (Signed)
Pre visit review using our clinic review tool, if applicable. No additional management support is needed unless otherwise documented below in the visit note. 

## 2015-09-01 NOTE — Patient Instructions (Signed)
BEFORE YOU LEAVE: -schedule Annual Wellness Exam in 3-4 months - please come fasting but DRINK PLENTY OF WATER prior to visit and we will plan to do labs  We recommend the following healthy lifestyle measures: - eat a healthy whole foods diet consisting of regular small meals composed of vegetables, fruits, beans, nuts, seeds, healthy meats such as white chicken and fish and whole grains.  - avoid sweets, white starchy foods, fried foods, fast food, processed foods, sodas, red meet and other fattening foods.  - get a least 150-300 minutes of aerobic exercise per week.

## 2015-09-01 NOTE — Progress Notes (Signed)
HPI:  Caroline Underwood is a pleasant 82 yo F with a complicated PMH sig for Alzheimers dementia, A. Flutter, HTN, AAA s/p repair, HLD, CKD, PAD, COPD, Hypothyroidism, GERD, iron def anemia and breast cancer here for follow up.  HTN/HLD/PAD/HX A. Flutter/s/p aortic aneurysm repair:  -followed by cardiologist (Dr. Acie Fredrickson)- labs checked 1/17 -continues eliquis, norvasc, lasix, metoprolol, lipitor  -reports chronic LE edema stable R>L -denies: CP, SOB, DOE, palpitations  Hypothyoroid:  -on synthroid 37.68mcg daily - stable last check 03/2015 -denies: hot/cold intolerance, constipation  Dementia:  -on aricept, followed by Dr. Tomi Likens in neurology  -feels safe at home - has supervision, walking with assistance  CKI/Anemia:  -stable with baseline cr around 1.5, sl up last check recently with cardiologist -anemia stable recent labs  COPD:  -used advair daily  -spontaneous pneumothorax with hospitalization in 04/23/2014  Hx Breast cancer:  -remote -triple neg s/p surgical intervention   ROS: See pertinent positives and negatives per HPI.  Past Medical History  Diagnosis Date  . Status post AAA (abdominal aortic aneurysm) repair 01/02/2013  . PAD (peripheral artery disease) ([place]) 01/02/2013  . Osteoporosis, unspecified 01/02/2013  . Hyperlipemia 01/02/2013  . Hx of atrial flutter, on Xarelto 01/02/2013  . Essential hypertension, benign 01/02/2013  . Chronic kidney disease 01/02/2013    stage 3/4  . Breast cancer ([place]) 01/02/2013  . COPD (chronic obstructive pulmonary disease) ([place]) 01/02/2013  . Dementia, on Namenda briefly in the past per review of records 01/02/2013  . GERD (gastroesophageal reflux disease) 01/02/2013  . Iron deficiency anemia 01/02/2013  . Unspecified hypothyroidism 01/02/2013    Past Surgical History  Procedure Laterality Date  . Abdominal aortic aneurysm repair    . Breast surgery      right lumpectomy    Family History  Problem Relation Age of Onset   . Cancer Mother     lung  . Heart disease Father 72    Social History   Social History  . Marital Status: Widowed    Spouse Name: N/A  . Number of Children: N/A  . Years of Education: N/A   Social History Main Topics  . Smoking status: Former Smoker -- 0.50 packs/day    Types: Cigarettes    Quit date: 02/16/2008  . Smokeless tobacco: Never Used  . Alcohol Use: No  . Drug Use: No  . Sexual Activity: No   Other Topics Concern  . None   Social History Narrative   Home Situation: living with daughter [name])      Spiritual Beliefs: none      Lifestyle: get around well in the house - uses a walker sometimes, has not had a history of any falls, has some mild dementia. She needs help with bathing. She needs some help with dressing. She does not do her own cooking or cleaning. Does not drive. She did manage all of her finances until 10/2012.              Current outpatient prescriptions:  .  ADVAIR DISKUS 100-50 MCG/DOSE AEPB, INHALE 1 PUFF INTO THE LUNGS EVERY 12 (TWELVE) HOURS., Disp: 60 each, Rfl: 2 .  amLODipine (NORVASC) 5 MG tablet, TAKE 1 TABLET (5 MG TOTAL) BY MOUTH DAILY., Disp: 90 tablet, Rfl: 3 .  apixaban (ELIQUIS) 2.5 MG TABS tablet, Take 1 tablet (2.5 mg total) by mouth 2 (two) times daily., Disp: 180 tablet, Rfl: 3 .  atorvastatin (LIPITOR) 20 MG tablet, TAKE 1 TABLET BY MOUTH EVERY DAY AT 6PM, Disp:  90 tablet, Rfl: 1 .  cholecalciferol (VITAMIN D) 1000 UNITS tablet, Take 1,000 Units by mouth once a week. On tuesday, Disp: , Rfl:  .  donepezil (ARICEPT) 10 MG tablet, TAKE 1 TABLET BY MOUTH AT BEDTIME, Disp: 90 tablet, Rfl: 3 .  furosemide (LASIX) 40 MG tablet, TAKE 1 TABLET BY MOUTH EVERY DAY, Disp: 90 tablet, Rfl: 0 .  levothyroxine (SYNTHROID, LEVOTHROID) 25 MCG tablet, TAKE 1&1/2 TABLETS BY MOUTH EVERY MORNING BEFORE BREAKFAST, Disp: 90 tablet, Rfl: 5 .  Melatonin 3 MG TABS, Take 3 mg by mouth at bedtime., Disp: , Rfl:  .  metoprolol (LOPRESSOR) 50 MG tablet,  TAKE 1 TABLET (50 MG TOTAL) BY MOUTH 2 (TWO) TIMES DAILY., Disp: 180 tablet, Rfl: 3 .  vitamin C (ASCORBIC ACID) 500 MG tablet, Take 500 mg by mouth daily., Disp: , Rfl:   EXAM:  Filed Vitals:   09/01/15 1554  BP: 134/80  Pulse: 63  Temp: 97.8 F (36.6 C)    Body mass index is 23.82 kg/(m^2).  GENERAL: vitals reviewed and listed above, alert, oriented, appears well hydrated and in no acute distress  HEENT: atraumatic, conjunttiva clear, no obvious abnormalities on inspection of external nose and ears  NECK: no obvious masses on inspection  LUNGS: clear to auscultation bilaterally, no wheezes, rales or rhonchi, good air movement  CV: HRRR, no peripheral edema  MS: moves all extremities without noticeable abnormality  PSYCH: pleasant and cooperative, no obvious depression or anxiety  ASSESSMENT AND PLAN:  Discussed the following assessment and plan:  Essential hypertension, benign - continue curren tx  Hyperlipemia - opted to hold of on labs for now and do at wellness visit  Paroxysmal atrial fibrillation (HCC) -stable  Chronic obstructive pulmonary disease, unspecified COPD type ([place]) -stable  PAD (peripheral artery disease) ([place]) -stable  Other specified hypothyroidism -stable  Chronic kidney disease, stage 3 (moderate) -monitor on labs at wellness visit  Alzheimer's dementia without behavioral disturbance, unspecified timing of dementia onset -stable  -Patient advised to return or notify a doctor immediately if symptoms worsen or persist or new concerns arise.  Patient Instructions  BEFORE YOU LEAVE: -schedule Annual Wellness Exam in 3-4 months - please come fasting but DRINK PLENTY OF WATER prior to visit and we will plan to do labs  We recommend the following healthy lifestyle measures: - eat a healthy whole foods diet consisting of regular small meals composed of vegetables, fruits, beans, nuts, seeds, healthy meats such as white chicken and fish  and whole grains.  - avoid sweets, white starchy foods, fried foods, fast food, processed foods, sodas, red meet and other fattening foods.  - get a least 150-300 minutes of aerobic exercise per week.       [name]

## 2015-09-12 ENCOUNTER — Other Ambulatory Visit: Payer: Self-pay | Admitting: Family Medicine

## 2015-09-16 ENCOUNTER — Other Ambulatory Visit: Payer: Self-pay | Admitting: Family Medicine

## 2015-10-09 ENCOUNTER — Other Ambulatory Visit: Payer: Self-pay | Admitting: Family Medicine

## 2015-11-24 ENCOUNTER — Ambulatory Visit (INDEPENDENT_AMBULATORY_CARE_PROVIDER_SITE_OTHER): Payer: Medicare Other | Admitting: Neurology

## 2015-11-24 ENCOUNTER — Encounter: Payer: Self-pay | Admitting: Neurology

## 2015-11-24 VITALS — BP 112/58 | HR 64 | Wt 146.0 lb

## 2015-11-24 DIAGNOSIS — F0280 Dementia in other diseases classified elsewhere without behavioral disturbance: Secondary | ICD-10-CM

## 2015-11-24 DIAGNOSIS — G301 Alzheimer's disease with late onset: Secondary | ICD-10-CM

## 2015-11-24 NOTE — Patient Instructions (Signed)
1.  Stop Aricept.  Instead, we will start Namzaric, which is a combination of Aricept and Namenda.  Follow the directions of the titration pack.  Contact us for refills 2.  Please review information regarding support groups and websites.  Continue using the [employer]. 3.  Follow up in 6 months.

## 2015-11-24 NOTE — Progress Notes (Signed)
NEUROLOGY FOLLOW UP OFFICE NOTE  Caroline Underwood [id number]  HISTORY OF PRESENT ILLNESS: Caroline Underwood is a 82 y.o. female with hypertension, peripheral artery disease, chronic kidney disease, iron deficiency anemia, COPD, atrial fibrillation and history of right breast cancer who follows up for Alzheimer's disease. She is accompanied by her daughter and son.    UPDATE: She is taking Aricept 10mg  daily.    She is still living with her daughter and son.  Her son is home with her 24 hours.  She is able to dress herself, use the toilet and bathe herself, however her daughter will stand by when she bathes.  She sleeps well.  She takes melatonin.  Her mood is good.   She does not have any hallucinations, delusions, or change in personality.  Appetite is good.  She denies depression.  She will watch TV or sit out on the porch with her son and talk.  She has trouble remembering where to find the ice cream in the freezer.  She is sometimes worried that she cannot find her bedroom.  When she uses the toilet, she sometimes does not remember where the toilet paper is disposed and she will place it in the trash can or on the floor.  She tends to pick at her scabs.  She usually remains in the wheelchair but does use a walker when she goes out.  She has not had any recent falls.  She only recognizes people she sees daily. Her son has gotten more irritable with her, and this upsets her.  Her son will be moving out at the end of August and her daughter will be retiring to care for her.    HISTORY: She began experiencing symptoms many years ago. She was having problems with memory and often repeating questions, as well as forgetting tasks and misplacing objects. At some point, several years ago, she was started on Namenda by her primary care physician, but discontinued it at the request of her son. There has been a progressive decline in her condition. Her decline has significantly worsened over the past year,  particularly following episodes of acute renal failure and hypertensive urgency.  She developed an episode of altered mental status, where she was walking around naked and then was laying on the couch for 2 days. She was also found to have a flutter and was started on Xarelto.  Her confusion has improved and she is functioning better, but definitely worse than before. She has good days and bad days.  She has always been able to pay her bills, up until last year.   She currently takes Aricept 10mg  daily.  She was unable to afford Namenda in the past.   PAST MEDICAL HISTORY: Past Medical History  Diagnosis Date  . Status post AAA (abdominal aortic aneurysm) repair 01/02/2013  . PAD (peripheral artery disease) ([place]) 01/02/2013  . Osteoporosis, unspecified 01/02/2013  . Hyperlipemia 01/02/2013  . Hx of atrial flutter, on Xarelto 01/02/2013  . Essential hypertension, benign 01/02/2013  . Chronic kidney disease 01/02/2013    stage 3/4  . Breast cancer ([place]) 01/02/2013  . COPD (chronic obstructive pulmonary disease) ([place]) 01/02/2013  . Dementia, on Namenda briefly in the past per review of records 01/02/2013  . GERD (gastroesophageal reflux disease) 01/02/2013  . Iron deficiency anemia 01/02/2013  . Unspecified hypothyroidism 01/02/2013    MEDICATIONS: Current Outpatient Prescriptions on File Prior to Visit  Medication Sig Dispense Refill  . ADVAIR DISKUS 100-50 MCG/DOSE AEPB INHALE ONE  DOSE BY MOUTH EVERY 12 HOURS 60 each 3  . amLODipine (NORVASC) 5 MG tablet TAKE 1 TABLET (5 MG TOTAL) BY MOUTH DAILY. 90 tablet 3  . apixaban (ELIQUIS) 2.5 MG TABS tablet Take 1 tablet (2.5 mg total) by mouth 2 (two) times daily. 180 tablet 3  . atorvastatin (LIPITOR) 20 MG tablet TAKE 1 TABLET BY MOUTH EVERY DAY AT 6PM 90 tablet 1  . atorvastatin (LIPITOR) 20 MG tablet TAKE 1 TABLET BY MOUTH EVERY DAY AT 6PM 90 tablet 1  . cholecalciferol (VITAMIN D) 1000 UNITS tablet Take 1,000 Units by mouth once a week. On tuesday      . furosemide (LASIX) 40 MG tablet TAKE 1 TABLET BY MOUTH EVERY DAY 90 tablet 0  . levothyroxine (SYNTHROID, LEVOTHROID) 25 MCG tablet TAKE ONE AND ONE-HALF TABLETS BY MOUTH IN THE MORNING BEFORE BREAKFAST 90 tablet 1  . Melatonin 3 MG TABS Take 3 mg by mouth at bedtime.    . metoprolol (LOPRESSOR) 50 MG tablet TAKE 1 TABLET (50 MG TOTAL) BY MOUTH 2 (TWO) TIMES DAILY. 180 tablet 3  . vitamin C (ASCORBIC ACID) 500 MG tablet Take 500 mg by mouth daily.     No current facility-administered medications on file prior to visit.    ALLERGIES: No Known Allergies  FAMILY HISTORY: Family History  Problem Relation Age of Onset  . Cancer Mother     lung  . Heart disease Father 57    SOCIAL HISTORY: Social History   Social History  . Marital Status: Widowed    Spouse Name: N/A  . Number of Children: N/A  . Years of Education: N/A   Occupational History  . Not on file.   Social History Main Topics  . Smoking status: Former Smoker -- 0.50 packs/day    Types: Cigarettes    Quit date: 02/16/2008  . Smokeless tobacco: Never Used  . Alcohol Use: No  . Drug Use: No  . Sexual Activity: No   Other Topics Concern  . Not on file   Social History Narrative   Home Situation: living with daughter Caroline Underwood)      Spiritual Beliefs: none      Lifestyle: get around well in the house - uses a walker sometimes, has not had a history of any falls, has some mild dementia. She needs help with bathing. She needs some help with dressing. She does not do her own cooking or cleaning. Does not drive. She did manage all of her finances until 10/2012.             REVIEW OF SYSTEMS: Constitutional: No fevers, chills, or sweats, no generalized fatigue, change in appetite Eyes: No visual changes, double vision, eye pain Ear, nose and throat: No hearing loss, ear pain, nasal congestion, sore throat Cardiovascular: No chest pain, palpitations Respiratory:  No shortness of breath at rest or with exertion,  wheezes GastrointestinaI: No nausea, vomiting, diarrhea, abdominal pain, fecal incontinence Genitourinary:  No dysuria, urinary retention or frequency Musculoskeletal:  No neck pain, back pain Integumentary: No rash, pruritus, skin lesions Neurological: as above Psychiatric: No depression, insomnia, anxiety Endocrine: No palpitations, fatigue, diaphoresis, mood swings, change in appetite, change in weight, increased thirst Hematologic/Lymphatic:  No anemia, purpura, petechiae. Allergic/Immunologic: no itchy/runny eyes, nasal congestion, recent allergic reactions, rashes  PHYSICAL EXAM: Filed Vitals:   11/24/15 1340  BP: 112/58  Pulse: 64   General: No acute distress.  Patient appears well-groomed.  normal body habitus. Head:  Normocephalic/atraumatic Eyes:  Fundi examined but not visualized Neck: supple, no paraspinal tenderness, full range of motion Heart:  Regular rate and rhythm Lungs:  Clear to auscultation bilaterally Back: No paraspinal tenderness Neurological Exam: alert and oriented to person only. Attention span and concentration intact, remote memory poor, remote memory intact, fund of knowledge intact.  Speech fluent and not dysarthric, language intact.    Montreal Cognitive Assessment  11/24/2015 02/24/2015 02/24/2015 05/26/2014  Visuospatial/ Executive (0/5) 0 2 2 2   Naming (0/3) 2 2 2 2   Attention: Read list of digits (0/2) 2 2 2 2   Attention: Read list of letters (0/1) 1 1 1 1   Attention: Serial 7 subtraction starting at 100 (0/3) 2 3 3 3   Language: Repeat phrase (0/2) 2 2 2 2   Language : Fluency (0/1) 0 0 0 0  Abstraction (0/2) 0 2 2 1   Delayed Recall (0/5) 0 0 0 0  Orientation (0/6) 0 0 0 3  Total 9 14 14 16   Adjusted Score (based on education) 9 14 14 16    Pupils pinpoint but round and equal.  CN II-XII intact. Fundi not visualized.  Bulk and tone normal, muscle strength 5/5 throughout.  Sensation to light touch  intact.  Deep tendon reflexes 2+ throughout.  Finger  to nose intact.  Gait cautious with short strides.  IMPRESSION: Alzheimer's dementia, progressed.  PLAN: 1.  We will stop Aricept and start Namzaric titration.  It should be affordable. 2.  When her daughter Caroline Underwood and is alone with her, we can determine if further help is needed. 3.  Information on support groups and websites provided. 4.  Follow up in 6 months, about 2 months after her daughter has started caring for her full-time.  27 minutes spent face to face with patient, over 50% spent discussing management and prognosis.  Caroline Clines, DO  CC:  Caroline Benton, DO

## 2015-11-25 ENCOUNTER — Encounter: Payer: Self-pay | Admitting: Podiatry

## 2015-11-25 ENCOUNTER — Ambulatory Visit (INDEPENDENT_AMBULATORY_CARE_PROVIDER_SITE_OTHER): Payer: Medicare Other | Admitting: Podiatry

## 2015-11-25 DIAGNOSIS — B351 Tinea unguium: Secondary | ICD-10-CM | POA: Diagnosis not present

## 2015-11-25 DIAGNOSIS — M79676 Pain in unspecified toe(s): Secondary | ICD-10-CM

## 2015-11-25 DIAGNOSIS — I739 Peripheral vascular disease, unspecified: Secondary | ICD-10-CM

## 2015-11-25 NOTE — Progress Notes (Signed)
Patient ID: Caroline Underwood, female   DOB: 1922/05/18, 82 y.o.   MRN: [id number] Complaint:  Visit Type: Patient returns to my office for continued preventative foot care services. Complaint: Patient states" my nails have grown long and thick and become painful to walk and wear shoes" . The patient presents for preventative foot care services. No changes to ROS  Podiatric Exam: Vascular: dorsalis pedis and posterior tibial pulses are not  palpable bilateral. Capillary return is immediate. Cold feet noted.  Sensorium: Normal Semmes Weinstein monofilament test. Normal tactile sensation bilaterally. Nail Exam: Pt has thick disfigured discolored nails with subungual debris noted bilateral entire nail hallux through fifth toenails Ulcer Exam: There is no evidence of ulcer or pre-ulcerative changes or infection. Orthopedic Exam: Muscle tone and strength are WNL. No limitations in general ROM. No crepitus or effusions noted. Foot type and digits show no abnormalities. Bony prominences are unremarkable. Skin: No Porokeratosis. No infection or ulcers  Diagnosis:  Onychomycosis, , Pain in right toe, pain in left toes  Treatment & Plan Procedures and Treatment: Consent by patient was obtained for treatment procedures. The patient understood the discussion of treatment and procedures well. All questions were answered thoroughly reviewed. Debridement of mycotic and hypertrophic toenails, 1 through 5 bilateral and clearing of subungual debris. No ulceration, no infection noted.  Return Visit-Office Procedure: Patient instructed to return to the office for a follow up visit 3 months for continued evaluation and treatment.   Gardiner Barefoot DPM

## 2015-12-02 ENCOUNTER — Encounter: Payer: Medicare Other | Admitting: Family Medicine

## 2015-12-07 ENCOUNTER — Telehealth: Payer: Self-pay | Admitting: Neurology

## 2015-12-07 MED ORDER — MEMANTINE HCL-DONEPEZIL HCL ER 28-10 MG PO CP24: 28.0000 mg | ORAL_CAPSULE | Freq: Every day | ORAL | Status: DC

## 2015-12-07 NOTE — Telephone Encounter (Signed)
RX from [employer] sent in.

## 2015-12-07 NOTE — Telephone Encounter (Signed)
Patient daughter called and states tha t we gave her a sample of the [place] they would like [place] to call in a rx at [facility] [address] call back number is [phone]

## 2015-12-11 ENCOUNTER — Telehealth: Payer: Self-pay | Admitting: Neurology

## 2015-12-11 NOTE — Telephone Encounter (Signed)
Namzeric was approved.

## 2015-12-11 NOTE — Telephone Encounter (Signed)
[employer] left a message in regards to a prescription/Dawn CB# [phone]

## 2015-12-21 ENCOUNTER — Encounter: Payer: Self-pay | Admitting: Neurology

## 2015-12-22 NOTE — Telephone Encounter (Signed)
Please see message about medication cost. Please advise.

## 2016-01-04 ENCOUNTER — Ambulatory Visit (INDEPENDENT_AMBULATORY_CARE_PROVIDER_SITE_OTHER): Payer: Medicare Other | Admitting: Family Medicine

## 2016-01-04 ENCOUNTER — Encounter: Payer: Self-pay | Admitting: Family Medicine

## 2016-01-04 VITALS — BP 136/60 | HR 61 | Temp 98.2°F | Ht 66.0 in | Wt 147.5 lb

## 2016-01-04 DIAGNOSIS — L989 Disorder of the skin and subcutaneous tissue, unspecified: Secondary | ICD-10-CM

## 2016-01-04 NOTE — Progress Notes (Signed)
HPI:  Acute lesion for:  Skin Lesion: -there for at lest 2 months -she has been picking it, they think may have been there prior -now irritated -denies: swelling, fevers, malaise  ROS: See pertinent positives and negatives per HPI.  Past Medical History  Diagnosis Date  . Status post AAA (abdominal aortic aneurysm) repair 01/02/2013  . PAD (peripheral artery disease) ([place]) 01/02/2013  . Osteoporosis, unspecified 01/02/2013  . Hyperlipemia 01/02/2013  . Hx of atrial flutter, on Xarelto 01/02/2013  . Essential hypertension, benign 01/02/2013  . Chronic kidney disease 01/02/2013    stage 3/4  . Breast cancer ([place]) 01/02/2013  . COPD (chronic obstructive pulmonary disease) ([place]) 01/02/2013  . Dementia, on Namenda briefly in the past per review of records 01/02/2013  . GERD (gastroesophageal reflux disease) 01/02/2013  . Iron deficiency anemia 01/02/2013  . Unspecified hypothyroidism 01/02/2013    Past Surgical History  Procedure Laterality Date  . Abdominal aortic aneurysm repair    . Breast surgery      right lumpectomy    Family History  Problem Relation Age of Onset  . Cancer Mother     lung  . Heart disease Father 15    Social History   Social History  . Marital Status: Widowed    Spouse Name: N/A  . Number of Children: N/A  . Years of Education: N/A   Social History Main Topics  . Smoking status: Former Smoker -- 0.50 packs/day    Types: Cigarettes    Quit date: 02/16/2008  . Smokeless tobacco: Never Used  . Alcohol Use: No  . Drug Use: No  . Sexual Activity: No   Other Topics Concern  . None   Social History Narrative   Home Situation: living with daughter [name])      Spiritual Beliefs: none      Lifestyle: get around well in the house - uses a walker sometimes, has not had a history of any falls, has some mild dementia. She needs help with bathing. She needs some help with dressing. She does not do her own cooking or cleaning. Does not drive. She did  manage all of her finances until 10/2012.              Current outpatient prescriptions:  .  ADVAIR DISKUS 100-50 MCG/DOSE AEPB, INHALE ONE DOSE BY MOUTH EVERY 12 HOURS, Disp: 60 each, Rfl: 3 .  amLODipine (NORVASC) 5 MG tablet, TAKE 1 TABLET (5 MG TOTAL) BY MOUTH DAILY., Disp: 90 tablet, Rfl: 3 .  apixaban (ELIQUIS) 2.5 MG TABS tablet, Take 1 tablet (2.5 mg total) by mouth 2 (two) times daily., Disp: 180 tablet, Rfl: 3 .  atorvastatin (LIPITOR) 20 MG tablet, TAKE 1 TABLET BY MOUTH EVERY DAY AT 6PM, Disp: 90 tablet, Rfl: 1 .  cholecalciferol (VITAMIN D) 1000 UNITS tablet, Take 1,000 Units by mouth once a week. On tuesday, Disp: , Rfl:  .  donepezil (ARICEPT) 10 MG tablet, , Disp: , Rfl: 0 .  furosemide (LASIX) 40 MG tablet, TAKE 1 TABLET BY MOUTH EVERY DAY, Disp: 90 tablet, Rfl: 0 .  levothyroxine (SYNTHROID, LEVOTHROID) 25 MCG tablet, TAKE ONE AND ONE-HALF TABLETS BY MOUTH IN THE MORNING BEFORE BREAKFAST, Disp: 90 tablet, Rfl: 1 .  Melatonin 3 MG TABS, Take 3 mg by mouth at bedtime., Disp: , Rfl:  .  Memantine HCl-Donepezil HCl (NAMZARIC) 28-10 MG CP24, Take 28 mg by mouth at bedtime., Disp: 30 capsule, Rfl: 3 .  metoprolol (LOPRESSOR) 50 MG tablet,  TAKE 1 TABLET (50 MG TOTAL) BY MOUTH 2 (TWO) TIMES DAILY., Disp: 180 tablet, Rfl: 3 .  vitamin C (ASCORBIC ACID) 500 MG tablet, Take 500 mg by mouth daily., Disp: , Rfl:   EXAM:  Filed Vitals:   01/04/16 1355  BP: 136/60  Pulse: 61  Temp: 98.2 F (36.8 C)    Body mass index is 23.82 kg/(m^2).  GENERAL: vitals reviewed and listed above, alert, oriented, appears well hydrated and in no acute distress  HEENT: atraumatic, conjunttiva clear, no obvious abnormalities on inspection of external nose and ears  NECK: no obvious masses on inspection  SKIN: crusty ` 1cm in diameter scab on L temperal region with dried blood and some surrounding SKs  MS: moves all extremities without noticeable abnormality  PSYCH: pleasant and cooperative,  no obvious depression or anxiety  ASSESSMENT AND PLAN:  Discussed the following assessment and plan:  Skin lesion  -query traumatized, possibly mildly infected SK vs other  -discussed possibility of skin neoplasm -after discussion they prefer to minimize procedures or specislist visits if possible so will try no picking, topical abx and re-eval in 2-4 weeks - may be able to try cryo if looks like SK otherwise derm referral for removal/bxs -Patient advised to return or notify a doctor immediately if symptoms worsen or persist or new concerns arise.  Patient Instructions  Apply the antibiotic ointment twice daily.   DO NOT PICK.  Follow up to recheck in 2-4 weeks.     [name]

## 2016-01-04 NOTE — Patient Instructions (Signed)
Apply the antibiotic ointment twice daily.   DO NOT PICK.  Follow up to recheck in 2-4 weeks.

## 2016-01-04 NOTE — Progress Notes (Signed)
Pre visit review using our clinic review tool, if applicable. No additional management support is needed unless otherwise documented below in the visit note. 

## 2016-01-12 ENCOUNTER — Encounter: Payer: Self-pay | Admitting: Family Medicine

## 2016-01-14 ENCOUNTER — Other Ambulatory Visit: Payer: Self-pay | Admitting: Family Medicine

## 2016-01-15 ENCOUNTER — Encounter: Payer: Self-pay | Admitting: Family Medicine

## 2016-01-15 ENCOUNTER — Ambulatory Visit (INDEPENDENT_AMBULATORY_CARE_PROVIDER_SITE_OTHER): Payer: Medicare Other | Admitting: Family Medicine

## 2016-01-15 VITALS — BP 130/60 | HR 60 | Temp 97.5°F | Ht 66.0 in

## 2016-01-15 DIAGNOSIS — L989 Disorder of the skin and subcutaneous tissue, unspecified: Secondary | ICD-10-CM | POA: Diagnosis not present

## 2016-01-15 NOTE — Progress Notes (Signed)
Pre visit review using our clinic review tool, if applicable. No additional management support is needed unless otherwise documented below in the visit note. 

## 2016-01-15 NOTE — Progress Notes (Signed)
HPI:  Caroline Underwood is a pleasant 82 year old here for an acute visit for a skin lesion on her face. They have been applying an antibiotic ointment and every time they do so she complains of pain and she has been picking at this area. The Band-Aid seems to irritate the lesion also. Overall it appears to be healing with reduced redness and swelling. No facial swelling, fevers, malaise. 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  ROS: See pertinent positives and negatives per HPI.  Past Medical History  Diagnosis Date  . Status post AAA (abdominal aortic aneurysm) repair 01/02/2013  . PAD (peripheral artery disease) ([place]) 01/02/2013  . Osteoporosis, unspecified 01/02/2013  . Hyperlipemia 01/02/2013  . Hx of atrial flutter, on Xarelto 01/02/2013  . Essential hypertension, benign 01/02/2013  . Chronic kidney disease 01/02/2013    stage 3/4  . Breast cancer ([place]) 01/02/2013  . COPD (chronic obstructive pulmonary disease) ([place]) 01/02/2013  . Dementia, on Namenda briefly in the past per review of records 01/02/2013  . GERD (gastroesophageal reflux disease) 01/02/2013  . Iron deficiency anemia 01/02/2013  . Unspecified hypothyroidism 01/02/2013    Past Surgical History  Procedure Laterality Date  . Abdominal aortic aneurysm repair    . Breast surgery      right lumpectomy    Family History  Problem Relation Age of Onset  . Cancer Mother     lung  . Heart disease Father 52    Social History   Social History  . Marital Status: Widowed    Spouse Name: N/A  . Number of Children: N/A  . Years of Education: N/A   Social History Main Topics  . Smoking status: Former Smoker -- 0.50 packs/day    Types: Cigarettes    Quit date: 02/16/2008  . Smokeless tobacco: Never Used  . Alcohol Use: No  . Drug Use: No  . Sexual Activity: No   Other Topics Concern  . None   Social History Narrative   Home Situation: living with daughter Caroline Underwood)       Spiritual Beliefs: none      Lifestyle: get around well in the house - uses a walker sometimes, has not had a history of any falls, has some mild dementia. She needs help with bathing. She needs some help with dressing. She does not do her own cooking or cleaning. Does not drive. She did manage all of her finances until 10/2012.              Current outpatient prescriptions:  .  ADVAIR DISKUS 100-50 MCG/DOSE AEPB, INHALE ONE DOSE BY MOUTH EVERY 12 HOURS, Disp: 60 each, Rfl: 2 .  amLODipine (NORVASC) 5 MG tablet, TAKE 1 TABLET (5 MG TOTAL) BY MOUTH DAILY., Disp: 90 tablet, Rfl: 3 .  apixaban (ELIQUIS) 2.5 MG TABS tablet, Take 1 tablet (2.5 mg total) by mouth 2 (two) times daily., Disp: 180 tablet, Rfl: 3 .  atorvastatin (LIPITOR) 20 MG tablet, TAKE 1 TABLET BY MOUTH EVERY DAY AT 6PM, Disp: 90 tablet, Rfl: 1 .  cholecalciferol (VITAMIN D) 1000 UNITS tablet, Take 1,000 Units by mouth once a week. On tuesday, Disp: , Rfl:  .  donepezil (ARICEPT) 10 MG tablet, , Disp: , Rfl: 0 .  furosemide (LASIX) 40 MG tablet, TAKE 1 TABLET BY MOUTH EVERY DAY, Disp: 90 tablet, Rfl: 0 .  levothyroxine (SYNTHROID, LEVOTHROID) 25 MCG tablet, TAKE ONE AND ONE-HALF TABLETS BY MOUTH IN THE  MORNING BEFORE BREAKFAST, Disp: 90 tablet, Rfl: 1 .  Melatonin 3 MG TABS, Take 3 mg by mouth at bedtime., Disp: , Rfl:  .  Memantine HCl-Donepezil HCl (NAMZARIC) 28-10 MG CP24, Take 28 mg by mouth at bedtime., Disp: 30 capsule, Rfl: 3 .  metoprolol (LOPRESSOR) 50 MG tablet, TAKE 1 TABLET (50 MG TOTAL) BY MOUTH 2 (TWO) TIMES DAILY., Disp: 180 tablet, Rfl: 3 .  vitamin C (ASCORBIC ACID) 500 MG tablet, Take 500 mg by mouth daily., Disp: , Rfl:   EXAM:  Filed Vitals:   01/15/16 1348  BP: 130/60  Pulse: 60  Temp: 97.5 F (36.4 C)    There is no weight on file to calculate BMI.  GENERAL: vitals reviewed and listed above, alert, oriented, appears well hydrated and in no acute distress  HEENT: atraumatic, conjunttiva clear, no  obvious abnormalities on inspection of external nose and ears  NECK: no obvious masses on inspection  SKIN: several large SKs on the left temporal region, 1 erythematous crusty and scabbed lesion in this area, The redness and erythema is decreased compared to last visit, there is some mild irritation/erythema from the Band-Aid   MS: moves all extremities without noticeable abnormality  PSYCH: pleasant and cooperative, no obvious depression or anxiety  ASSESSMENT AND PLAN:  Discussed the following assessment and plan:  Skin lesion of face  -Is difficult to tell whether this is an SK that has been traumatized or if there is a skin cancer in this area  -They have opted for observation and trying to see if it will heal with avoiding picking and antibiotic ointment as they would like to avoid a biopsy if possible -It does appear to be improving, but it looks like she may have irritation from the Band-Aid  -Trial Silvadene once daily and gauze with paper tape versus no covering and follow-up with dermatologist or me in 2-4 weeks - sooner as needed; needs biopsy if persistent nonhealing lesion  -Patient advised to return or notify a doctor immediately if symptoms worsen or persist or new concerns arise.  There are no Patient Instructions on file for this visit.  [name], DO

## 2016-01-25 ENCOUNTER — Ambulatory Visit: Payer: Medicare Other | Admitting: Family Medicine

## 2016-01-27 ENCOUNTER — Other Ambulatory Visit: Payer: Self-pay | Admitting: Family Medicine

## 2016-02-17 ENCOUNTER — Other Ambulatory Visit: Payer: Self-pay | Admitting: Family Medicine

## 2016-03-16 ENCOUNTER — Ambulatory Visit (INDEPENDENT_AMBULATORY_CARE_PROVIDER_SITE_OTHER): Payer: Medicare Other | Admitting: Cardiovascular Disease

## 2016-03-16 ENCOUNTER — Other Ambulatory Visit: Payer: Self-pay | Admitting: Neurology

## 2016-03-16 ENCOUNTER — Encounter: Payer: Self-pay | Admitting: Cardiovascular Disease

## 2016-03-16 VITALS — BP 128/62 | HR 48 | Ht 66.0 in | Wt 145.1 lb

## 2016-03-16 DIAGNOSIS — I4891 Unspecified atrial fibrillation: Secondary | ICD-10-CM | POA: Diagnosis not present

## 2016-03-16 DIAGNOSIS — I10 Essential (primary) hypertension: Secondary | ICD-10-CM | POA: Diagnosis not present

## 2016-03-16 DIAGNOSIS — I480 Paroxysmal atrial fibrillation: Secondary | ICD-10-CM | POA: Diagnosis not present

## 2016-03-16 MED ORDER — METOPROLOL TARTRATE 25 MG PO TABS: 25.0000 mg | ORAL_TABLET | Freq: Two times a day (BID) | ORAL | Status: DC

## 2016-03-16 NOTE — Patient Instructions (Signed)
Medication Instructions:  Your physician has recommended you make the following change in your medication:  REDUCE Metoprolol to 25mg  twice daily   Labwork: None ordered  Testing/Procedures: None ordered  Follow-Up: Your physician wants you to follow-up in: 6 months with Dr.Nahser You will receive a reminder letter in the mail two months in advance. If you don't receive a letter, please call our office to schedule the follow-up appointment.   Any Other Special Instructions Will Be Listed Below (If Applicable).     If you need a refill on your cardiac medications before your next appointment, please call your pharmacy.

## 2016-03-16 NOTE — Progress Notes (Signed)
Cardiology Office Note   Date:  03/16/2016   ID:  Caroline Underwood, DOB 10/13/1921, MRN [id number]  PCP:  Lucretia Kern., DO  Cardiologist:   Mertie Moores, MD   Chief Complaint  Patient presents with  . Atrial Fibrillation   Problems List:  1.  History of atrial flutter 2. Status post abdominal aortic aneurysm repair 3. Hyperlipidemia 4. COPD 5. Breast cancer - Dx May, 2014 6. Chronic kidney disease 7. Dementia 8. Hypertension   History of Present Illness: Caroline Underwood is a 82 y.o. female who presents for follow up of her  Atrial flutter. AAA repair, HTN, CKD . She fell at the end of last year.   Because of her risk of falling , the Xarelto was changed to Eliquis.   February 25, 2015:   Doing well. BP has been normal. No CP or dyspnea,. Is eating well.  Has gained 11 lbs since her last visit.   Aug. 30, 2017:  Tomorrow is seen for follow up of her PAF  Seen with daughter , [name] . HR is slow - 48 No syncope or presyncope     Past Medical History:  Diagnosis Date  . Breast cancer ([place]) 01/02/2013  . Chronic kidney disease 01/02/2013   stage 3/4  . COPD (chronic obstructive pulmonary disease) ([place]) 01/02/2013  . Dementia, on Namenda briefly in the past per review of records 01/02/2013  . Essential hypertension, benign 01/02/2013  . GERD (gastroesophageal reflux disease) 01/02/2013  . Hx of atrial flutter, on Xarelto 01/02/2013  . Hyperlipemia 01/02/2013  . Iron deficiency anemia 01/02/2013  . Osteoporosis, unspecified 01/02/2013  . PAD (peripheral artery disease) ([place]) 01/02/2013  . Status post AAA (abdominal aortic aneurysm) repair 01/02/2013  . Unspecified hypothyroidism 01/02/2013    Past Surgical History:  Procedure Laterality Date  . ABDOMINAL AORTIC ANEURYSM REPAIR    . BREAST SURGERY     right lumpectomy     Current Outpatient Prescriptions  Medication Sig Dispense Refill  . ADVAIR DISKUS 100-50 MCG/DOSE AEPB INHALE ONE DOSE BY MOUTH EVERY 12  HOURS 60 each 2  . amLODipine (NORVASC) 5 MG tablet TAKE 1 TABLET (5 MG TOTAL) BY MOUTH DAILY. 90 tablet 3  . apixaban (ELIQUIS) 2.5 MG TABS tablet Take 1 tablet (2.5 mg total) by mouth 2 (two) times daily. 180 tablet 3  . atorvastatin (LIPITOR) 20 MG tablet TAKE ONE TABLET BY MOUTH ONCE DAILY AT  6PM 90 tablet 0  . donepezil (ARICEPT) 10 MG tablet Take 10 mg by mouth at bedtime.   0  . furosemide (LASIX) 40 MG tablet TAKE ONE TABLET BY MOUTH ONCE DAILY 90 tablet 1  . levothyroxine (SYNTHROID, LEVOTHROID) 25 MCG tablet TAKE ONE & ONE-HALF TABLETS BY MOUTH ONCE DAILY IN THE MORNING BEFORE BREAKFAST 90 tablet 0  . Melatonin 3 MG TABS Take 3 mg by mouth at bedtime.    . metoprolol (LOPRESSOR) 50 MG tablet TAKE 1 TABLET (50 MG TOTAL) BY MOUTH 2 (TWO) TIMES DAILY. 180 tablet 3  . vitamin C (ASCORBIC ACID) 500 MG tablet Take 500 mg by mouth daily.     No current facility-administered medications for this visit.     Allergies:   Review of patient's allergies indicates no known allergies.    Social History:  The patient  reports that she quit smoking about 8 years ago. Her smoking use included Cigarettes. She smoked 0.50 packs per day. She has never used smokeless tobacco. She reports that she does  not drink alcohol or use drugs.   Family History:  The patient's family history includes Cancer in her mother; Heart disease (age of onset: 63) in her father.    ROS:  Please see the history of present illness.    Review of Systems: Constitutional:  denies fever, chills, diaphoresis, appetite change and fatigue.  HEENT: denies photophobia, eye pain, redness, hearing loss, ear pain, congestion, sore throat, rhinorrhea, sneezing, neck pain, neck stiffness and tinnitus.  Respiratory: denies SOB, DOE, cough, chest tightness, and wheezing.  Cardiovascular: denies chest pain, palpitations and leg swelling.  Gastrointestinal: denies nausea, vomiting, abdominal pain, diarrhea, constipation, blood in stool.    Genitourinary: denies dysuria, urgency, frequency, hematuria, flank pain and difficulty urinating.  Musculoskeletal: denies  myalgias, back pain, joint swelling, arthralgias and gait problem.   Skin: denies pallor, rash and wound.  Neurological: denies dizziness, seizures, syncope, weakness, light-headedness, numbness and headaches.   Hematological: denies adenopathy, easy bruising, personal or family bleeding history.  Psychiatric/ Behavioral: denies suicidal ideation, mood changes, confusion, nervousness, sleep disturbance and agitation.       All other systems are reviewed and negative.    PHYSICAL EXAM: VS:  BP 128/62   Pulse (!) 48   Ht 5\' 6"  (1.676 m)   Wt 145 lb 1.9 oz (65.8 kg)   BMI 23.42 kg/m  , BMI Body mass index is 23.42 kg/m. GEN: Well nourished, well developed, in no acute distress  HEENT: normal  Neck: no JVD, carotid bruits, or masses Cardiac: RRR; no murmurs, rubs, or gallops,no edema  Respiratory:  clear to auscultation bilaterally, normal work of breathing GI: soft, nontender, nondistended, + BS MS: no deformity or atrophy  Skin: warm and dry, no rash Neuro:  Strength and sensation are intact Psych: normal   EKG:  EKG is ordered today.   Sinus brady at 48.  1st degree AV block      Recent Labs: 03/31/2015: TSH 2.60 07/22/2015: BUN 26; Creat 1.76; Hemoglobin 13.4; Platelets 180; Potassium 3.5; Sodium 139    Lipid Panel    Component Value Date/Time   CHOL 158 11/19/2013 1324   TRIG 165.0 (H) 11/19/2013 1324   HDL 56.80 11/19/2013 1324   CHOLHDL 3 11/19/2013 1324   VLDL 33.0 11/19/2013 1324   LDLCALC 68 11/19/2013 1324      Wt Readings from Last 3 Encounters:  03/16/16 145 lb 1.9 oz (65.8 kg)  01/04/16 147 lb 8 oz (66.9 kg)  11/24/15 146 lb (66.2 kg)      Other studies Reviewed: Additional studies/ records that were reviewed today include: . Review of the above records demonstrates:    ASSESSMENT AND PLAN:  1.  History of atrial  flutter-  She seems to be doing well.  Continue on current dose of Eliquis.  She is maintaining normal sinus rhythm. We will reduce her metoprolol dose to 25 mg a day because of bradycardia. 2. Status post abdominal aortic aneurysm repair - stable  3. Hyperlipidemia 4. COPD 5. Breast cancer - Dx May, 2014 6. Chronic kidney disease 7. Dementia 8. Hypertension -  Her blood pressures fairly well-controlled.  Continue current medications.   Current medicines are reviewed at length with the patient today.  The patient does not have concerns regarding medicines.  The following changes have been made:  no change   Disposition:   FU with me in 6 months Her daughter will send in some heart rate and blood pressure data via my chart portal.  Signed, Mertie Moores, MD  03/16/2016 8:13 AM    [facility] [address], [place], [place]  [address] Phone: [phone]; Fax: [phone]

## 2016-03-16 NOTE — Telephone Encounter (Signed)
Rx sent 

## 2016-03-22 ENCOUNTER — Encounter: Payer: Self-pay | Admitting: Cardiovascular Disease

## 2016-03-24 ENCOUNTER — Ambulatory Visit (INDEPENDENT_AMBULATORY_CARE_PROVIDER_SITE_OTHER): Payer: Medicare Other | Admitting: Podiatry

## 2016-03-24 ENCOUNTER — Encounter: Payer: Self-pay | Admitting: Podiatry

## 2016-03-24 DIAGNOSIS — M79676 Pain in unspecified toe(s): Secondary | ICD-10-CM | POA: Diagnosis not present

## 2016-03-24 DIAGNOSIS — I739 Peripheral vascular disease, unspecified: Secondary | ICD-10-CM

## 2016-03-24 DIAGNOSIS — B351 Tinea unguium: Secondary | ICD-10-CM | POA: Diagnosis not present

## 2016-03-24 NOTE — Progress Notes (Signed)
Patient ID: Caroline Underwood, female   DOB: Dec 13, 1921, 82 y.o.   MRN: [id number] Complaint:  Visit Type: Patient returns to my office for continued preventative foot care services. Complaint: Patient states" my nails have grown long and thick and become painful to walk and wear shoes" . The patient presents for preventative foot care services. No changes to ROS  Podiatric Exam: Vascular: dorsalis pedis and posterior tibial pulses are not  palpable bilateral. Capillary return is immediate. Cold feet noted.  Sensorium: Normal Semmes Weinstein monofilament test. Normal tactile sensation bilaterally. Nail Exam: Pt has thick disfigured discolored nails with subungual debris noted bilateral entire nail hallux through fifth toenails Ulcer Exam: There is no evidence of ulcer or pre-ulcerative changes or infection. Orthopedic Exam: Muscle tone and strength are WNL. No limitations in general ROM. No crepitus or effusions noted. Foot type and digits show no abnormalities. Bony prominences are unremarkable. Skin: No Porokeratosis. No infection or ulcers  Diagnosis:  Onychomycosis, , Pain in right toe, pain in left toes  Treatment & Plan Procedures and Treatment: Consent by patient was obtained for treatment procedures. The patient understood the discussion of treatment and procedures well. All questions were answered thoroughly reviewed. Debridement of mycotic and hypertrophic toenails, 1 through 5 bilateral and clearing of subungual debris. No ulceration, no infection noted.  Return Visit-Office Procedure: Patient instructed to return to the office for a follow up visit 3 months for continued evaluation and treatment.   Gardiner Barefoot DPM

## 2016-03-31 ENCOUNTER — Encounter: Payer: Self-pay | Admitting: Cardiovascular Disease

## 2016-04-04 ENCOUNTER — Other Ambulatory Visit: Payer: Self-pay | Admitting: Family Medicine

## 2016-04-13 ENCOUNTER — Other Ambulatory Visit: Payer: Self-pay | Admitting: Family Medicine

## 2016-04-16 ENCOUNTER — Other Ambulatory Visit: Payer: Self-pay | Admitting: Family Medicine

## 2016-04-18 ENCOUNTER — Encounter: Payer: Self-pay | Admitting: Family Medicine

## 2016-04-18 ENCOUNTER — Ambulatory Visit (INDEPENDENT_AMBULATORY_CARE_PROVIDER_SITE_OTHER): Payer: Medicare Other | Admitting: Family Medicine

## 2016-04-18 VITALS — BP 122/52 | HR 58 | Temp 97.7°F | Ht 66.0 in | Wt 143.9 lb

## 2016-04-18 DIAGNOSIS — R21 Rash and other nonspecific skin eruption: Secondary | ICD-10-CM | POA: Diagnosis not present

## 2016-04-18 DIAGNOSIS — K649 Unspecified hemorrhoids: Secondary | ICD-10-CM

## 2016-04-18 MED ORDER — HYDROCORTISONE 2.5 % RE CREA
1.0000 | TOPICAL_CREAM | Freq: Two times a day (BID) | RECTAL | 0 refills | Status: DC
Start: 2016-04-18 — End: 2016-06-17

## 2016-04-18 NOTE — Patient Instructions (Signed)
BEFORE YOU LEAVE: -follow up: as scheduled  Keep skin clean and dry and change incontinence underwear frequently.  Hemorrhoid cream 1-2 times daily.  Clotrimazole or monistat cream 1-2 times daily for 2 weeks and apply aquaphor on top to entire affected area.  Move frequently.

## 2016-04-18 NOTE — Progress Notes (Signed)
Pre visit review using our clinic review tool, if applicable. No additional management support is needed unless otherwise documented below in the visit note. 

## 2016-04-18 NOTE — Progress Notes (Signed)
HPI:  Acute visit for:  REctal pain: -for 2 days -no bleeding, change in bowels, swelling -hx hemorrhoids -hx occ incontinence and uses incontinence underwear -moves frequently -daughter did Desitin cream   ROS: See pertinent positives and negatives per HPI.  Past Medical History:  Diagnosis Date  . Breast cancer ([place]) 01/02/2013  . Chronic kidney disease 01/02/2013   stage 3/4  . COPD (chronic obstructive pulmonary disease) ([place]) 01/02/2013  . Dementia, on Namenda briefly in the past per review of records 01/02/2013  . Essential hypertension, benign 01/02/2013  . GERD (gastroesophageal reflux disease) 01/02/2013  . Hx of atrial flutter, on Xarelto 01/02/2013  . Hyperlipemia 01/02/2013  . Iron deficiency anemia 01/02/2013  . Osteoporosis, unspecified 01/02/2013  . PAD (peripheral artery disease) ([place]) 01/02/2013  . Status post AAA (abdominal aortic aneurysm) repair 01/02/2013  . Unspecified hypothyroidism 01/02/2013    Past Surgical History:  Procedure Laterality Date  . ABDOMINAL AORTIC ANEURYSM REPAIR    . BREAST SURGERY     right lumpectomy    Family History  Problem Relation Age of Onset  . Cancer Mother     lung  . Heart disease Father 29    Social History   Social History  . Marital status: Widowed    Spouse name: N/A  . Number of children: N/A  . Years of education: N/A   Social History Main Topics  . Smoking status: Former Smoker    Packs/day: 0.50    Types: Cigarettes    Quit date: 02/16/2008  . Smokeless tobacco: Never Used  . Alcohol use No  . Drug use: No  . Sexual activity: No   Other Topics Concern  . None   Social History Narrative   Home Situation: living with daughter [name])      Spiritual Beliefs: none      Lifestyle: get around well in the house - uses a walker sometimes, has not had a history of any falls, has some mild dementia. She needs help with bathing. She needs some help with dressing. She does not do her own cooking or cleaning.  Does not drive. She did manage all of her finances until 10/2012.              Current Outpatient Prescriptions:  .  ADVAIR DISKUS 100-50 MCG/DOSE AEPB, INHALE ONE DOSE BY MOUTH EVERY 12 HOURS, Disp: 60 each, Rfl: 2 .  amLODipine (NORVASC) 5 MG tablet, TAKE ONE TABLET BY MOUTH ONCE DAILY (Patient taking differently: TAKE ONE TABLET BY MOUTH TWICE A DAY), Disp: 90 tablet, Rfl: 1 .  apixaban (ELIQUIS) 2.5 MG TABS tablet, Take 1 tablet (2.5 mg total) by mouth 2 (two) times daily., Disp: 180 tablet, Rfl: 3 .  atorvastatin (LIPITOR) 20 MG tablet, TAKE ONE TABLET BY MOUTH ONCE DAILY AT  6PM, Disp: 90 tablet, Rfl: 0 .  donepezil (ARICEPT) 10 MG tablet, TAKE ONE TABLET BY MOUTH AT BEDTIME, Disp: 90 tablet, Rfl: 1 .  furosemide (LASIX) 40 MG tablet, TAKE ONE TABLET BY MOUTH ONCE DAILY, Disp: 90 tablet, Rfl: 1 .  levothyroxine (SYNTHROID, LEVOTHROID) 25 MCG tablet, TAKE ONE & ONE-HALF TABLETS BY MOUTH ONCE DAILY IN THE MORNING BEFORE BREAKFAST, Disp: 135 tablet, Rfl: 1 .  Melatonin 3 MG TABS, Take 3 mg by mouth at bedtime., Disp: , Rfl:  .  metoprolol (LOPRESSOR) 25 MG tablet, Take 1 tablet (25 mg total) by mouth 2 (two) times daily., Disp: , Rfl:  .  vitamin C (ASCORBIC ACID) 500  MG tablet, Take 500 mg by mouth daily., Disp: , Rfl:  .  hydrocortisone (ANUSOL-HC) 2.5 % rectal cream, Place 1 application rectally 2 (two) times daily., Disp: 30 g, Rfl: 0  EXAM:  Vitals:   04/18/16 1258  BP: (!) 122/52  Pulse: (!) 58  Temp: 97.7 F (36.5 C)    Body mass index is 23.23 kg/m.  GENERAL: vitals reviewed and listed above, alert, oriented, appears well hydrated and in no acute distress  HEENT: atraumatic, conjunttiva clear, no obvious abnormalities on inspection of external nose and ears  NECK: no obvious masses on inspection  LUNGS: clear to auscultation bilaterally, no wheezes, rales or rhonchi, good air movement  CV: HRRR, no peripheral edema  DRE/SKIN: sharply demarcated area of erythema in  in the gluteal cleft and external genitalia area with fine scale on periphery, several perianal skin tags and small ext hemorrhoid, small amount of stool perianal region  MS: moves all extremities without noticeable abnormality  PSYCH: pleasant and cooperative, no obvious depression or anxiety  ASSESSMENT AND PLAN:  Discussed the following assessment and plan:  Rash and nonspecific skin eruption  Hemorrhoids, unspecified hemorrhoid type  -see pt instructions -has medicare exam coming up -Patient advised to return or notify a doctor immediately if symptoms worsen or persist or new concerns arise.  Patient Instructions  BEFORE YOU LEAVE: -follow up: as scheduled  Keep skin clean and dry and change incontinence underwear frequently.  Hemorrhoid cream 1-2 times daily.  Clotrimazole or monistat cream 1-2 times daily for 2 weeks and apply aquaphor on top to entire affected area.  Move frequently.    [name], DO

## 2016-04-20 ENCOUNTER — Encounter: Payer: Self-pay | Admitting: Family Medicine

## 2016-04-22 ENCOUNTER — Telehealth: Payer: Self-pay | Admitting: Family Medicine

## 2016-04-22 ENCOUNTER — Ambulatory Visit (INDEPENDENT_AMBULATORY_CARE_PROVIDER_SITE_OTHER): Payer: Medicare Other | Admitting: Family Medicine

## 2016-04-22 ENCOUNTER — Encounter: Payer: Self-pay | Admitting: Family Medicine

## 2016-04-22 VITALS — BP 132/60 | HR 67 | Temp 98.0°F | Ht 66.0 in | Wt 143.0 lb

## 2016-04-22 DIAGNOSIS — K649 Unspecified hemorrhoids: Secondary | ICD-10-CM | POA: Diagnosis not present

## 2016-04-22 DIAGNOSIS — F0280 Dementia in other diseases classified elsewhere without behavioral disturbance: Secondary | ICD-10-CM | POA: Diagnosis not present

## 2016-04-22 DIAGNOSIS — B372 Candidiasis of skin and nail: Secondary | ICD-10-CM

## 2016-04-22 DIAGNOSIS — G309 Alzheimer's disease, unspecified: Secondary | ICD-10-CM | POA: Diagnosis not present

## 2016-04-22 LAB — POCT URINALYSIS DIPSTICK
BILIRUBIN UA: NEGATIVE
Glucose, UA: NEGATIVE
KETONES UA: NEGATIVE
Leukocytes, UA: NEGATIVE
Nitrite, UA: NEGATIVE
PH UA: 5
Protein, UA: NEGATIVE
SPEC GRAV UA: 1.02
Urobilinogen, UA: 0.2

## 2016-04-22 MED ORDER — FLUCONAZOLE 150 MG PO TABS: 150.0000 mg | ORAL_TABLET | Freq: Once | ORAL | 0 refills | Status: AC

## 2016-04-22 NOTE — Progress Notes (Signed)
Pre visit review using our clinic review tool, if applicable. No additional management support is needed unless otherwise documented below in the visit note. 

## 2016-04-22 NOTE — Progress Notes (Signed)
HPI:  Caroline Underwood is a pleasant 82 year old with dementia here with her caregiver and daughter for an acute visit for hemorrhoids. We saw her last week and she had a small hemorrhoid and a yeast skin rash and was treated with hemorrhoid cream and topical yeast treatment. I assumed that the daughter would be applying these products, however she reports this would make her nauseous and she has not been applying the treatments. She reports she did tell the patient how to apply them. However she also reports that the patient does not wipe well and does not know how to wipe properly given her degree of dementia, so we're uncertain if she has been applying the creams or if so has been applying them properly. Per daughter the patient continues to complain of discomfort in the rectal area. They did several sitz baths, but the patient defecated in the water and now the daughter is concerned she may have a UTI. Denies fevers, diarrhea, vomiting, malaise, (melena or hematochezia to daughter's knowledge however she does not examine bowel movements other than those done in the sitz bath) strengthening to daughter's knowledge  or significant change in mental status. Patient continues to eat well.   ROS: See pertinent positives and negatives per HPI.  Past Medical History:  Diagnosis Date  . Breast cancer ([place]) 01/02/2013  . Chronic kidney disease 01/02/2013   stage 3/4  . COPD (chronic obstructive pulmonary disease) ([place]) 01/02/2013  . Dementia, on Namenda briefly in the past per review of records 01/02/2013  . Essential hypertension, benign 01/02/2013  . GERD (gastroesophageal reflux disease) 01/02/2013  . Hx of atrial flutter, on Xarelto 01/02/2013  . Hyperlipemia 01/02/2013  . Iron deficiency anemia 01/02/2013  . Osteoporosis, unspecified 01/02/2013  . PAD (peripheral artery disease) ([place]) 01/02/2013  . Status post AAA (abdominal aortic aneurysm) repair 01/02/2013  . Unspecified hypothyroidism 01/02/2013    Past  Surgical History:  Procedure Laterality Date  . ABDOMINAL AORTIC ANEURYSM REPAIR    . BREAST SURGERY     right lumpectomy    Family History  Problem Relation Age of Onset  . Cancer Mother     lung  . Heart disease Father 51    Social History   Social History  . Marital status: Widowed    Spouse name: N/A  . Number of children: N/A  . Years of education: N/A   Social History Main Topics  . Smoking status: Former Smoker    Packs/day: 0.50    Types: Cigarettes    Quit date: 02/16/2008  . Smokeless tobacco: Never Used  . Alcohol use No  . Drug use: No  . Sexual activity: No   Other Topics Concern  . None   Social History Narrative   Home Situation: living with daughter [name])      Spiritual Beliefs: none      Lifestyle: get around well in the house - uses a walker sometimes, has not had a history of any falls, has some mild dementia. She needs help with bathing. She needs some help with dressing. She does not do her own cooking or cleaning. Does not drive. She did manage all of her finances until 10/2012.              Current Outpatient Prescriptions:  .  ADVAIR DISKUS 100-50 MCG/DOSE AEPB, INHALE ONE DOSE BY MOUTH EVERY 12 HOURS, Disp: 60 each, Rfl: 2 .  amLODipine (NORVASC) 5 MG tablet, TAKE ONE TABLET BY MOUTH ONCE DAILY (Patient taking differently:  TAKE ONE TABLET BY MOUTH TWICE A DAY), Disp: 90 tablet, Rfl: 1 .  apixaban (ELIQUIS) 2.5 MG TABS tablet, Take 1 tablet (2.5 mg total) by mouth 2 (two) times daily., Disp: 180 tablet, Rfl: 3 .  atorvastatin (LIPITOR) 20 MG tablet, TAKE ONE TABLET BY MOUTH ONCE DAILY AT  6PM, Disp: 90 tablet, Rfl: 0 .  donepezil (ARICEPT) 10 MG tablet, TAKE ONE TABLET BY MOUTH AT BEDTIME, Disp: 90 tablet, Rfl: 1 .  furosemide (LASIX) 40 MG tablet, TAKE ONE TABLET BY MOUTH ONCE DAILY, Disp: 90 tablet, Rfl: 1 .  hydrocortisone (ANUSOL-HC) 2.5 % rectal cream, Place 1 application rectally 2 (two) times daily., Disp: 30 g, Rfl: 0 .   levothyroxine (SYNTHROID, LEVOTHROID) 25 MCG tablet, TAKE ONE & ONE-HALF TABLETS BY MOUTH ONCE DAILY IN THE MORNING BEFORE BREAKFAST, Disp: 135 tablet, Rfl: 1 .  Melatonin 3 MG TABS, Take 3 mg by mouth at bedtime., Disp: , Rfl:  .  metoprolol (LOPRESSOR) 25 MG tablet, Take 1 tablet (25 mg total) by mouth 2 (two) times daily., Disp: , Rfl:  .  vitamin C (ASCORBIC ACID) 500 MG tablet, Take 500 mg by mouth daily., Disp: , Rfl:  .  fluconazole (DIFLUCAN) 150 MG tablet, Take 1 tablet (150 mg total) by mouth once., Disp: 2 tablet, Rfl: 0  EXAM:  Vitals:   04/22/16 1515  BP: 132/60  Pulse: 67  Temp: 98 F (36.7 C)    Body mass index is 23.08 kg/m.  GENERAL: vitals reviewed and listed above, alert, oriented, appears well hydrated and in no acute distress  HEENT: atraumatic, conjunttiva clear, no obvious abnormalities on inspection of external nose and ears  NECK: no obvious masses on inspection  LUNGS: clear to auscultation bilaterally, no wheezes, rales or rhonchi, good air movement  CV: HRRR, no peripheral edema  GU/RECTAL: Hemorrhoids, irritated but not thrombosed, intertrigo   MS: moves all extremities without noticeable abnormality  PSYCH: pleasant and cooperative, no obvious depression or anxiety  ASSESSMENT AND PLAN:  Discussed the following assessment and plan:  Hemorrhoids, unspecified hemorrhoid type - Plan: Ambulatory referral to Gastroenterology  Candidal intertrigo - Plan: POC Urinalysis Dipstick, Culture, Urine  Alzheimer's dementia without behavioral disturbance, unspecified timing of dementia onset  -Daughter very distraught about patient discomfort  -will do oral tablet for yeast and advised daughter to apply the hemorrhoid creams and yeast cream and showed her how to do this   -Advised to stick to place referral to GI to see what else can be done about hemorrhoids  -Patient advised to return or notify a doctor immediately if symptoms worsen or persist or new  concerns arise.  Patient Instructions  BEFORE YOU LEAVE: -urine studies -follow up: 1 week as scheduled, sooner as needed  Apply the hemorrhoid cream and yeast creams as we discussed twice daily  No sitz baths  Diflucan yeast pill one now and one in 3 days  -We placed a referral for you as discussed to the gastroenterologist. It usually takes about 1-2 weeks to process and schedule this referral. If you have not heard from [place] regarding this appointment in 2 weeks please contact our office.        [name], DO

## 2016-04-22 NOTE — Telephone Encounter (Signed)
I called the pts daughter and she stated she tried a sitz bath and this did not help her and she now feels the pt has a urinary tract infection due to the pt not having a set routine, states the pt "acts different than normal dementia"  Patient seems confused and she is complaining of severe pain in her bottom.  I scheduled the pt an appt for today at 3:30pm and the message was sent to Dr Maudie Mercury.

## 2016-04-22 NOTE — Patient Instructions (Addendum)
BEFORE YOU LEAVE: -urine studies -follow up: 1 week as scheduled, sooner as needed  Apply the hemorrhoid cream and yeast creams as we discussed twice daily  No sitz baths  Diflucan yeast pill one now and one in 3 days  -We placed a referral for you as discussed to the gastroenterologist. It usually takes about 1-2 weeks to process and schedule this referral. If you have not heard from [place] regarding this appointment in 2 weeks please contact our office.

## 2016-04-22 NOTE — Telephone Encounter (Signed)
Pt daughter would like jo ann to return her call

## 2016-04-24 LAB — URINE CULTURE

## 2016-04-25 ENCOUNTER — Other Ambulatory Visit: Payer: Self-pay | Admitting: *Deleted

## 2016-04-25 ENCOUNTER — Telehealth: Payer: Self-pay | Admitting: *Deleted

## 2016-04-25 ENCOUNTER — Encounter: Payer: Self-pay | Admitting: Family Medicine

## 2016-04-25 MED ORDER — TRAMADOL HCL 50 MG PO TABS: ORAL_TABLET | ORAL | 0 refills | Status: DC

## 2016-04-25 NOTE — Telephone Encounter (Signed)
Rx done and the pts daughter was notified via Mychart message.

## 2016-04-25 NOTE — Telephone Encounter (Signed)
Printed Rx for Tramadol was signed and faxed to [facility] at [phone].

## 2016-04-28 NOTE — Progress Notes (Signed)
Caroline Underwood is a pleasant 86 with a complicated PMH here for her preventive visit. She has Alzheimer's dementia and is seeing a neurologist and lives with her daughter whom is full time caregiver.Her daughter is weith her 24/7.  She sees her cardiologist for a hx of atrial flutter, PAD, Hx AAA repair, A. Fib, HLD and hypertension and is on Eliquis, several antihypertensives and a statin. She takes synthroid for hypothyroidism. She has CKD and mild anemia. She uses advair for COPD. She has a hx of osteoporosis and was reportedly treated for > then 5 years with bisphosphonate's. She recently has been battling with hemorrhoids and a yeast infection and is scheduled to see GI for for the hemorrhoids and rectal pain. Daughter reports this has improved a little and she feels it looks much better. Tramadol is helping with pain.She has a history of breast cancer, treated surgically. Daughter has declined further mammograms.  ROS: negative for report of fevers, unintentional weight loss, vision changes, vision loss, hearing loss or change, chest pain, sob, hemoptysis, melena, hematochezia, hematuria, genital discharge or lesions, falls, bleeding or bruising, loc, thoughts of suicide or self harm, memory loss  Review of Medical History: -PMH, PSH, Family History and current specialty and care providers reviewed and updated and listed below  - see scanned in document in chart and below   Review of functional ability and level of safety:  Any difficulty hearing? See scanned document  History of falling? See scanned document  Any trouble with IADLs - using a phone, using transportation, grocery shopping, preparing meals, doing housework, doing laundry, taking medications and managing money? See scanned document  Advance Directives? NO - daughter and pt would like more information and will had a discussion with trained RN today about advanced directives. See notes by Lucina Mellow, RN.  See summary  of recommendations in Patient Instructions below.  Past Medical History:  Diagnosis Date  . Breast cancer ([place]) 01/02/2013  . Chronic kidney disease 01/02/2013   stage 3/4  . COPD (chronic obstructive pulmonary disease) ([place]) 01/02/2013  . Dementia, on Namenda briefly in the past per review of records 01/02/2013  . Essential hypertension, benign 01/02/2013  . GERD (gastroesophageal reflux disease) 01/02/2013  . Hx of atrial flutter, on Xarelto 01/02/2013  . Hyperlipemia 01/02/2013  . Iron deficiency anemia 01/02/2013  . Osteoporosis, unspecified 01/02/2013  . PAD (peripheral artery disease) ([place]) 01/02/2013  . Status post AAA (abdominal aortic aneurysm) repair 01/02/2013  . Unspecified hypothyroidism 01/02/2013    Past Surgical History:  Procedure Laterality Date  . ABDOMINAL AORTIC ANEURYSM REPAIR    . BREAST SURGERY     right lumpectomy    Social History   Social History  . Marital status: Widowed    Spouse name: N/A  . Number of children: N/A  . Years of education: N/A   Occupational History  . Not on file.   Social History Main Topics  . Smoking status: Former Smoker    Packs/day: 0.50    Types: Cigarettes    Quit date: 02/16/2008  . Smokeless tobacco: Never Used  . Alcohol use No  . Drug use: No  . Sexual activity: No   Other Topics Concern  . Not on file   Social History Narrative   Home Situation: living with daughter [name])      Spiritual Beliefs: none      Lifestyle: get around well in the house - uses a walker sometimes, has not had a history of any  falls, has some mild dementia. She needs help with bathing. She needs some help with dressing. She does not do her own cooking or cleaning. Does not drive. She did manage all of her finances until 10/2012.             Family History  Problem Relation Age of Onset  . Cancer Mother     lung  . Heart disease Father 56    Current Outpatient Prescriptions on File Prior to Visit  Medication Sig Dispense Refill   . ADVAIR DISKUS 100-50 MCG/DOSE AEPB INHALE ONE DOSE BY MOUTH EVERY 12 HOURS 60 each 2  . amLODipine (NORVASC) 5 MG tablet TAKE ONE TABLET BY MOUTH ONCE DAILY (Patient taking differently: TAKE ONE TABLET BY MOUTH TWICE A DAY) 90 tablet 1  . apixaban (ELIQUIS) 2.5 MG TABS tablet Take 1 tablet (2.5 mg total) by mouth 2 (two) times daily. 180 tablet 3  . atorvastatin (LIPITOR) 20 MG tablet TAKE ONE TABLET BY MOUTH ONCE DAILY AT  6PM 90 tablet 0  . donepezil (ARICEPT) 10 MG tablet TAKE ONE TABLET BY MOUTH AT BEDTIME 90 tablet 1  . furosemide (LASIX) 40 MG tablet TAKE ONE TABLET BY MOUTH ONCE DAILY 90 tablet 1  . hydrocortisone (ANUSOL-HC) 2.5 % rectal cream Place 1 application rectally 2 (two) times daily. 30 g 0  . levothyroxine (SYNTHROID, LEVOTHROID) 25 MCG tablet TAKE ONE & ONE-HALF TABLETS BY MOUTH ONCE DAILY IN THE MORNING BEFORE BREAKFAST 135 tablet 1  . Melatonin 3 MG TABS Take 3 mg by mouth at bedtime.    . metoprolol (LOPRESSOR) 25 MG tablet Take 1 tablet (25 mg total) by mouth 2 (two) times daily.    . traMADol (ULTRAM) 50 MG tablet Take 1/2-1 tablet not more than every 12 hours as needed for pain 20 tablet 0  . vitamin C (ASCORBIC ACID) 500 MG tablet Take 500 mg by mouth daily.     No current facility-administered medications on file prior to visit.     Physical Exam Vitals:   04/29/16 1058  BP: (!) 140/52  Pulse: 80  Temp: 97.9 F (36.6 C)   Estimated body mass index is 23.03 kg/m as calculated from the following:   Height as of 04/22/16: 5\' 6"  (1.676 m).   Weight as of this encounter: 142 lb 11.2 oz (64.7 kg).  EKG (optional): deferred  GENERAL: vitals reviewed and listed below, alert, oriented, appears well hydrated and in no acute distress  HEENT: head atraumatic, PERRLA, visual acuity grossly intact, normal appearance of eyes, ears, nose and mouth. moist mucus membranes.  NECK: supple, no masses or lymphadenopathy  LUNGS: clear to auscultation bilaterally, no rales,  rhonchi or wheeze  CV: HRRR, no peripheral edema or cyanosis, normal pedal pulses  BREAST: normal appearance - no lesions or discharge, on palpation normal breast tissue without any suspicious masses - surgical scar R breast  ABDOMEN: bowel sounds normal, soft, non tender to palpation, no masses, no rebound or guarding  GU: she has some irritated and red skin in the labial folds, this actually is improving, speculum not performed per pt preference  RECTAL: improved appearance of hemorrhoids, still with skin tags, yeast dermatitis resolved posteriorly, very mild pressure sore sacrum  SKIN: no rash or abnormal lesions - sees above, multiple SKs, clogged pore face - debris removed  MS: normal gait, moves all extremities normally  NEURO: normal gait, speech and thought processing grossly intact, muscle tone grossly intact throughout  PSYCH: normal  affect, pleasant and cooperative Cognitive function grossly intact  Education and counseling regarding the above review of health provided with a plan for the following: -see scanned patient completed form for further details -fall prevention strategies discussed  -healthy lifestyle discussed -importance and resources for completing advanced directives discussed -see patient instructions below for any other recommendations provided  4)The following written screening schedule of preventive measures were reviewed with assessment and plan made per below, orders and patient instructions:      AAA screening done if applicable     Alcohol screening done     Obesity Screening and counseling done     STI screening (Hep C if born 36-65) offered and per pt wishes     Tobacco Screening done        Pneumococcal (PPSV23 -one dose after 64, one before if risk factors), influenza yearly and hepatitis B vaccines (if high risk - end stage renal disease, IV drugs, homosexual men, live in home for mentally retarded, hemophilia receiving  factors) ASSESSMENT/PLAN: done       Screening mammograph (yearly if >40) ASSESSMENT/PLAN: pt and daughter have declined further mammograms. They agreed to CBE today.      Screening Pap smear/pelvic exam (q2 years) ASSESSMENT/PLAN: n/a, declined      Colorectal cancer screening (FOBT yearly or flex sig q4y or colonoscopy q10y or barium enema q4y) ASSESSMENT/PLAN: declined further      Diabetes outpatient self-management training services ASSESSMENT/PLAN: utd or done      Bone mass measurements(covered q2y if indicated - estrogen def, osteoporosis, hyperparathyroid, vertebral abnormalities, osteoporosis or steroids) ASSESSMENT/PLAN: pt and daughter declined further testing, advised Vit D3 [phone] IU daily, adequate dietary calcium intake na dwt bearing exercise as tolerated      Screening for glaucoma(q1y if high risk - diabetes, FH, AA and > 50 or hispanic and > 65) ASSESSMENT/PLAN: utd or advised      Medical nutritional therapy for individuals with diabetes or renal disease ASSESSMENT/PLAN: see orders      Cardiovascular screening blood tests (lipids q5y) ASSESSMENT/PLAN: see orders and labs      Diabetes screening tests ASSESSMENT/PLAN: see orders and labs   1. Medicare annual wellness visit, subsequent -see notes, pt handouts, care teams and scanned documents from reviewed for preventive care/wellness visit -all preventive care measures reviewed -declined mammogram, further bone density tests -flu shot and labs today  2. Encounter for preventive health examination -USPSTF Recs reviewed -full physical exam today -fasting labs not done in some time, advised and offered -flu shot today  3. Essential hypertension, benign -cont current management with cardiologist, labs advised  4. Paroxysmal atrial fibrillation (HCC) -cont current management with cardiologist, labs advised  5. Hyperlipidemia, unspecified hyperlipidemia type -cont current management with  cardiologist, labs advised  6. PAD (peripheral artery disease) ([place]) -cont current management -labs advised  7. Age-related osteoporosis without current pathological fracture -she/daughter has opted against further testing for this given age, risks/benefits testing and tx reviewed with daughter and pt  8. Chronic obstructive pulmonary disease, unspecified COPD type (HCC) -stable  9. Gastroesophageal reflux disease, esophagitis presence not specified -stable  10. Alzheimer's dementia without behavioral disturbance, unspecified timing of dementia onset -managed by neurology, daughter has follow up with neurology planned -discussed caregiver respite options and support  11. Other specified hypothyroidism -labs advised  12. Stage 3 chronic kidney disease -labs advised  13. Rectal pain - had yeast infection and hemorrhoids on prior exams - referred to GI for further eval  to exclude other and for further management due to persist pain/discomfort despite tx for yeast, hemorrhoids and pain. Exam much improved. If rectal issues resolved not other GI/rectal issues per GI eval and persistent issues advised they call as would have her see gyn. Advised frequent turning, keeping pt dry.  [name], DO

## 2016-04-29 ENCOUNTER — Encounter: Payer: Self-pay | Admitting: Family Medicine

## 2016-04-29 ENCOUNTER — Ambulatory Visit (INDEPENDENT_AMBULATORY_CARE_PROVIDER_SITE_OTHER): Payer: Medicare Other | Admitting: Family Medicine

## 2016-04-29 VITALS — BP 140/52 | HR 80 | Temp 97.9°F | Wt 142.7 lb

## 2016-04-29 DIAGNOSIS — E038 Other specified hypothyroidism: Secondary | ICD-10-CM

## 2016-04-29 DIAGNOSIS — I10 Essential (primary) hypertension: Secondary | ICD-10-CM | POA: Diagnosis not present

## 2016-04-29 DIAGNOSIS — M810 Age-related osteoporosis without current pathological fracture: Secondary | ICD-10-CM

## 2016-04-29 DIAGNOSIS — E785 Hyperlipidemia, unspecified: Secondary | ICD-10-CM

## 2016-04-29 DIAGNOSIS — Z0000 Encounter for general adult medical examination without abnormal findings: Secondary | ICD-10-CM

## 2016-04-29 DIAGNOSIS — N183 Chronic kidney disease, stage 3 unspecified: Secondary | ICD-10-CM

## 2016-04-29 DIAGNOSIS — Z23 Encounter for immunization: Secondary | ICD-10-CM | POA: Diagnosis not present

## 2016-04-29 DIAGNOSIS — I480 Paroxysmal atrial fibrillation: Secondary | ICD-10-CM | POA: Diagnosis not present

## 2016-04-29 DIAGNOSIS — J449 Chronic obstructive pulmonary disease, unspecified: Secondary | ICD-10-CM

## 2016-04-29 DIAGNOSIS — K219 Gastro-esophageal reflux disease without esophagitis: Secondary | ICD-10-CM

## 2016-04-29 DIAGNOSIS — I739 Peripheral vascular disease, unspecified: Secondary | ICD-10-CM

## 2016-04-29 DIAGNOSIS — G309 Alzheimer's disease, unspecified: Secondary | ICD-10-CM

## 2016-04-29 DIAGNOSIS — F0280 Dementia in other diseases classified elsewhere without behavioral disturbance: Secondary | ICD-10-CM

## 2016-04-29 LAB — BASIC METABOLIC PANEL
BUN: 18 mg/dL (ref 6–23)
CALCIUM: 9.4 mg/dL (ref 8.4–10.5)
CO2: 30 mEq/L (ref 19–32)
Chloride: 102 mEq/L (ref 96–112)
Creatinine, Ser: 1.98 mg/dL — ABNORMAL HIGH (ref 0.40–1.20)
GFR: 25.05 mL/min — AB (ref >60.00)
GLUCOSE: 114 mg/dL — AB (ref 70–99)
Potassium: 3.9 mEq/L (ref 3.5–5.1)
SODIUM: 142 meq/L (ref 135–145)

## 2016-04-29 LAB — CBC
HCT: 41 % (ref 36.0–46.0)
HEMOGLOBIN: 13.8 g/dL (ref 12.0–15.0)
MCHC: 33.7 g/dL (ref 30.0–36.0)
MCV: 92.6 fl (ref 78.0–100.0)
PLATELETS: 224 10*3/uL (ref 150.0–400.0)
RBC: 4.43 Mil/uL (ref 3.87–5.11)
RDW: 12.9 % (ref 11.5–15.5)
WBC: 11.6 10*3/uL — ABNORMAL HIGH (ref 4.0–10.5)

## 2016-04-29 LAB — TSH: TSH: 3.2 u[IU]/mL (ref 0.35–4.50)

## 2016-04-29 LAB — CHOLESTEROL, TOTAL: Cholesterol: 171 mg/dL (ref 0–200)

## 2016-04-29 LAB — HDL CHOLESTEROL: HDL: 50.8 mg/dL (ref >39.00)

## 2016-04-29 NOTE — Patient Instructions (Addendum)
BEFORE YOU LEAVE: -follow up: 3-4 months -flu shot -labs -advanced directive information with Sharyn Lull  We have ordered labs or studies at this visit. It can take up to 1-2 weeks for results and processing. IF results require follow up or explanation, we will call you with instructions. Clinically stable results will be released to your [facility]. If you have not heard from [place] or cannot find your results in [facility] in 2 weeks please contact our office at [phone].  If you are not yet signed up for [facility], please consider signing up.  See gi as planned and follow up if any persistent concerns.  Vit D3 [id number] IU daily  Ensure 3 healthy meals per day that include protein.  Move often and do not sit or lie in one position for a long time.  Change incontinence underwear frequently.

## 2016-04-29 NOTE — Progress Notes (Signed)
Spent 28 minutes discussing the following issues regarding Advance Directives:  -Goals of care  -Code status: Difference between a full code, limited code (MOST form), and DNR -Difference between hospice and palliative care -Living Will  -Reason and significance for power of attorney -Reviewed each page in Advance Directive packet and answered questions by daughter and patient   Both patient and daughter were present during conversation. Patient stated she had Alzheimer's. Daughter is caregiver. No paperwork was completed during this conversation. Patient and daughter were given copy of Advance Directive packet to complete, have notarized, and return back to office. Daughter stated she will communicate via MyChart when they are ready to come back into office.

## 2016-04-29 NOTE — Progress Notes (Signed)
Pre visit review using our clinic review tool, if applicable. No additional management support is needed unless otherwise documented below in the visit note. 

## 2016-05-01 ENCOUNTER — Emergency Department (HOSPITAL_COMMUNITY): Payer: Medicare Other

## 2016-05-01 ENCOUNTER — Emergency Department (HOSPITAL_COMMUNITY)
Admission: EM | Admit: 2016-05-01 | Discharge: 2016-05-01 | Disposition: A | Discharge status: Home / Self Care | Payer: Medicare Other | Attending: Emergency Medicine | Admitting: Emergency Medicine

## 2016-05-01 ENCOUNTER — Encounter (HOSPITAL_COMMUNITY): Payer: Self-pay

## 2016-05-01 DIAGNOSIS — I129 Hypertensive chronic kidney disease with stage 1 through stage 4 chronic kidney disease, or unspecified chronic kidney disease: Secondary | ICD-10-CM | POA: Insufficient documentation

## 2016-05-01 DIAGNOSIS — K6289 Other specified diseases of anus and rectum: Secondary | ICD-10-CM | POA: Diagnosis not present

## 2016-05-01 DIAGNOSIS — Z79899 Other long term (current) drug therapy: Secondary | ICD-10-CM | POA: Diagnosis not present

## 2016-05-01 DIAGNOSIS — E039 Hypothyroidism, unspecified: Secondary | ICD-10-CM | POA: Insufficient documentation

## 2016-05-01 DIAGNOSIS — N184 Chronic kidney disease, stage 4 (severe): Secondary | ICD-10-CM | POA: Insufficient documentation

## 2016-05-01 DIAGNOSIS — J449 Chronic obstructive pulmonary disease, unspecified: Secondary | ICD-10-CM | POA: Insufficient documentation

## 2016-05-01 DIAGNOSIS — R1032 Left lower quadrant pain: Secondary | ICD-10-CM

## 2016-05-01 DIAGNOSIS — Z7901 Long term (current) use of anticoagulants: Secondary | ICD-10-CM | POA: Diagnosis not present

## 2016-05-01 DIAGNOSIS — G309 Alzheimer's disease, unspecified: Secondary | ICD-10-CM | POA: Diagnosis not present

## 2016-05-01 DIAGNOSIS — Z853 Personal history of malignant neoplasm of breast: Secondary | ICD-10-CM | POA: Insufficient documentation

## 2016-05-01 LAB — CBC WITH DIFFERENTIAL/PLATELET
Basophils Absolute: 0.1 10*3/uL (ref 0.0–0.1)
Basophils Relative: 1 %
EOS PCT: 2 %
Eosinophils Absolute: 0.3 10*3/uL (ref 0.0–0.7)
HEMATOCRIT: 41.8 % (ref 36.0–46.0)
Hemoglobin: 14.1 g/dL (ref 12.0–15.0)
LYMPHS ABS: 2.3 10*3/uL (ref 0.7–4.0)
LYMPHS PCT: 18 %
MCH: 31.1 pg (ref 26.0–34.0)
MCHC: 33.7 g/dL (ref 30.0–36.0)
MCV: 92.1 fL (ref 78.0–100.0)
MONO ABS: 0.8 10*3/uL (ref 0.1–1.0)
Monocytes Relative: 6 %
NEUTROS ABS: 9.4 10*3/uL — AB (ref 1.7–7.7)
Neutrophils Relative %: 73 %
PLATELETS: 208 10*3/uL (ref 150–400)
RBC: 4.54 MIL/uL (ref 3.87–5.11)
RDW: 12.3 % (ref 11.5–15.5)
WBC: 12.9 10*3/uL — ABNORMAL HIGH (ref 4.0–10.5)

## 2016-05-01 LAB — URINALYSIS, ROUTINE W REFLEX MICROSCOPIC
Bilirubin Urine: NEGATIVE
GLUCOSE, UA: NEGATIVE mg/dL
KETONES UR: NEGATIVE mg/dL
LEUKOCYTES UA: NEGATIVE
Nitrite: NEGATIVE
PH: 6.5 (ref 5.0–8.0)
PROTEIN: 100 mg/dL — AB
Specific Gravity, Urine: 1.01 (ref 1.005–1.030)

## 2016-05-01 LAB — BASIC METABOLIC PANEL
ANION GAP: 12 (ref 5–15)
BUN: 17 mg/dL (ref 6–20)
CHLORIDE: 101 mmol/L (ref 101–111)
CO2: 24 mmol/L (ref 22–32)
Calcium: 9.5 mg/dL (ref 8.9–10.3)
Creatinine, Ser: 1.89 mg/dL — ABNORMAL HIGH (ref 0.44–1.00)
GFR calc Af Amer: 25 mL/min — ABNORMAL LOW (ref >60)
GFR calc non Af Amer: 22 mL/min — ABNORMAL LOW (ref >60)
GLUCOSE: 137 mg/dL — AB (ref 65–99)
POTASSIUM: 3.8 mmol/L (ref 3.5–5.1)
Sodium: 137 mmol/L (ref 135–145)

## 2016-05-01 LAB — URINE MICROSCOPIC-ADD ON: Bacteria, UA: NONE SEEN

## 2016-05-01 MED ORDER — DOCUSATE SODIUM 100 MG PO CAPS: 100.0000 mg | ORAL_CAPSULE | Freq: Every day | ORAL | 0 refills | Status: DC | PRN

## 2016-05-01 MED ORDER — FENTANYL CITRATE (PF) 100 MCG/2ML IJ SOLN
50.0000 ug | Freq: Once | INTRAMUSCULAR | Status: AC
Start: 2016-05-01 — End: 2016-05-01
  Administered 2016-05-01: 50 ug via INTRAVENOUS
  Filled 2016-05-01: qty 2

## 2016-05-01 MED ORDER — IOPAMIDOL (ISOVUE-300) INJECTION 61%
INTRAVENOUS | Status: AC
  Filled 2016-05-01: qty 30

## 2016-05-01 MED ORDER — HYDROCODONE-ACETAMINOPHEN 5-325 MG PO TABS: 1.0000 | ORAL_TABLET | Freq: Three times a day (TID) | ORAL | 0 refills | Status: DC | PRN

## 2016-05-01 NOTE — ED Provider Notes (Signed)
[place] DEPT Provider Note   CSN: [id number] Arrival date & time: 05/01/16  [id number]     History   Chief Complaint Chief Complaint  Patient presents with  . Rectal Pain    HPI Caroline Underwood is a 82 y.o. female.  HPI   Pt with hx dementia, Afib on eliquis, HTN, HLD, CKD III, osteoporosis, breast CA tx with radiation, hemorrhoids, brought in by daughter for anal/rectal pain and decreased PO intake.  Rectal pain has been ongoing x 2 weeks, treated by PCP for hemorrhoids and skin yeast infection with improvement.  Pain has worsened, is unclear where the pain actually is given patient's dementia but she indicates it is at her anus or inside her rectum.  Pt has not eaten since yesterday, even refused morning coffee today, very out of the ordinary for her.  C/O constant rectal pain.  Has taken tramadol without relief.  Patient lives at home with 24 hour care by her daughter but is independent in her toileting - daughter unsure of urinary or bowel changes.  Daughter reports not other complaints or changes noted - no fevers, vomiting, cough, SOB, no other pain complaints.  Pt denies any other concerns, unable to remember about toileting.    Level V caveat for dementia.    Past Medical History:  Diagnosis Date  . Breast cancer ([place]) 01/02/2013  . Chronic kidney disease 01/02/2013   stage 3/4  . COPD (chronic obstructive pulmonary disease) ([place]) 01/02/2013  . Dementia, on Namenda briefly in the past per review of records 01/02/2013  . Essential hypertension, benign 01/02/2013  . GERD (gastroesophageal reflux disease) 01/02/2013  . Hx of atrial flutter, on Xarelto 01/02/2013  . Hyperlipemia 01/02/2013  . Iron deficiency anemia 01/02/2013  . Osteoporosis, unspecified 01/02/2013  . PAD (peripheral artery disease) ([place]) 01/02/2013  . Status post AAA (abdominal aortic aneurysm) repair 01/02/2013  . Unspecified hypothyroidism 01/02/2013    Patient Active Problem List   Diagnosis Date Noted  .  Alzheimer's disease 01/23/2013  . Breast cancer ([place]) 01/02/2013  . Essential hypertension, benign 01/02/2013  . Atrial fibrillation ([place]) 01/02/2013  . Hyperlipemia 01/02/2013  . Osteoporosis 01/02/2013  . PAD (peripheral artery disease) ([place]) 01/02/2013  . Chronic kidney disease 01/02/2013  . Hypothyroidism 01/02/2013  . GERD (gastroesophageal reflux disease) 01/02/2013  . COPD (chronic obstructive pulmonary disease) ([place]) 01/02/2013    Past Surgical History:  Procedure Laterality Date  . ABDOMINAL AORTIC ANEURYSM REPAIR    . BREAST SURGERY     right lumpectomy    OB History    No data available       Home Medications    Prior to Admission medications   Medication Sig Start Date End Date Taking? Authorizing Provider  ADVAIR DISKUS 100-50 MCG/DOSE AEPB INHALE ONE DOSE BY MOUTH EVERY 12 HOURS Patient taking differently: Inhale 1 puff by mouth twice daily 04/13/16  Yes Eulas Post, MD  amLODipine (NORVASC) 5 MG tablet TAKE ONE TABLET BY MOUTH ONCE DAILY Patient taking differently: Take 5 mg by mouth twice daily 04/04/16  Yes Lucretia Kern, DO  apixaban (ELIQUIS) 2.5 MG TABS tablet Take 1 tablet (2.5 mg total) by mouth 2 (two) times daily. 06/08/15  Yes Thayer Headings, MD  atorvastatin (LIPITOR) 20 MG tablet TAKE ONE TABLET BY MOUTH ONCE DAILY AT  6PM Patient taking differently: Take 20 mg by mouth daily at 1800 02/17/16  Yes Lucretia Kern, DO  donepezil (ARICEPT) 10 MG tablet TAKE ONE TABLET BY  MOUTH AT BEDTIME Patient taking differently: Take 10 mg by mouth at bedtime 03/16/16  Yes Adam Telford Nab, DO  furosemide (LASIX) 40 MG tablet TAKE ONE TABLET BY MOUTH ONCE DAILY Patient taking differently: Take 40 mg by mouth once daily 01/28/16  Yes Lucretia Kern, DO  levothyroxine (SYNTHROID, LEVOTHROID) 25 MCG tablet TAKE ONE & ONE-HALF TABLETS BY MOUTH ONCE DAILY IN THE MORNING BEFORE BREAKFAST Patient taking differently: Take 37.5 mcg by mouth once daily in the morning before  breakfast 04/18/16  Yes Lucretia Kern, DO  Melatonin 5 MG CAPS Take 5 mg by mouth at bedtime.    Yes Historical Provider, MD  metoprolol (LOPRESSOR) 25 MG tablet Take 1 tablet (25 mg total) by mouth 2 (two) times daily. 03/16/16  Yes Thayer Headings, MD  phenylephrine-shark liver oil-mineral oil-petrolatum (PREPARATION H) 0.25-3-14-71.9 % rectal ointment Place 1 application rectally 2 (two) times daily.   Yes Historical Provider, MD  traMADol (ULTRAM) 50 MG tablet Take 1/2-1 tablet not more than every 12 hours as needed for pain Patient taking differently: Take 50 mg by mouth daily as needed for moderate pain.  04/25/16  Yes Lucretia Kern, DO  vitamin C (ASCORBIC ACID) 500 MG tablet Take 500 mg by mouth daily.   Yes Historical Provider, MD  docusate sodium (COLACE) 100 MG capsule Take 1 capsule (100 mg total) by mouth daily as needed for mild constipation or moderate constipation. 05/01/16   Clayton Bibles, PA-C  HYDROcodone-acetaminophen (NORCO/VICODIN) 5-325 MG tablet Take 1 tablet by mouth 3 (three) times daily as needed for moderate pain or severe pain. 05/01/16   Clayton Bibles, PA-C  hydrocortisone (ANUSOL-HC) 2.5 % rectal cream Place 1 application rectally 2 (two) times daily. Patient not taking: Reported on 05/01/2016 04/18/16   Lucretia Kern, DO    Family History Family History  Problem Relation Age of Onset  . Cancer Mother     lung  . Heart disease Father 88    Social History Social History  Substance Use Topics  . Smoking status: Former Smoker    Packs/day: 0.50    Types: Cigarettes    Quit date: 02/16/2008  . Smokeless tobacco: Never Used  . Alcohol use No     Allergies   Review of patient's allergies indicates no known allergies.   Review of Systems Review of Systems  Unable to perform ROS: Dementia     Physical Exam Updated Vital Signs BP (!) 149/44   Pulse 63   Temp 97.7 F (36.5 C) (Oral)   Resp 24   SpO2 94%   Physical Exam  Constitutional: She appears  well-developed and well-nourished. No distress.  HENT:  Head: Normocephalic and atraumatic.  Neck: Neck supple.  Cardiovascular: Normal rate and regular rhythm.   Pulmonary/Chest: Effort normal and breath sounds normal. No respiratory distress. She has no wheezes. She has no rales.  Abdominal: Soft. She exhibits no distension. There is tenderness in the left lower quadrant. There is no rigidity, no rebound and no guarding.  Genitourinary: Rectal exam shows external hemorrhoid and internal hemorrhoid. Rectal exam shows no mass and anal tone normal.  Genitourinary Comments: No significant tenderness within the rectum.  No tender or thrombosed hemorrhoids.  Two external hemorrhoids are flat, pink.  Internal hemorrhoids soft.    Neurological: She is alert.  Skin: She is not diaphoretic.  Nursing note and vitals reviewed.    ED Treatments / Results  Labs (all labs ordered are listed, but only  abnormal results are displayed) Labs Reviewed  BASIC METABOLIC PANEL - Abnormal; Notable for the following:       Result Value   Glucose, Bld 137 (*)    Creatinine, Ser 1.89 (*)    GFR calc non Af Amer 22 (*)    GFR calc Af Amer 25 (*)    All other components within normal limits  CBC WITH DIFFERENTIAL/PLATELET - Abnormal; Notable for the following:    WBC 12.9 (*)    Neutro Abs 9.4 (*)    All other components within normal limits  URINALYSIS, ROUTINE W REFLEX MICROSCOPIC (NOT AT [facility]) - Abnormal; Notable for the following:    Hgb urine dipstick TRACE (*)    Protein, ur 100 (*)    All other components within normal limits  URINE MICROSCOPIC-ADD ON - Abnormal; Notable for the following:    Squamous Epithelial / LPF 0-5 (*)    All other components within normal limits    EKG  EKG Interpretation None       Radiology Ct Abdomen Pelvis Wo Contrast  Result Date: 05/01/2016 CLINICAL DATA:  Left lower quadrant abdominal pain.  Rectal pain. EXAM: CT ABDOMEN AND PELVIS WITHOUT CONTRAST  TECHNIQUE: Multidetector CT imaging of the abdomen and pelvis was performed following the standard protocol without IV contrast. COMPARISON:  04/23/2014 FINDINGS: Lower chest: 8 mm lingular nodule. This was not visualized in 2015 at this size, even allowing for distortion by large pneumothorax at that time Atherosclerosis of the coronary arteries. Lipomatous hypertrophy of the interatrial septum. Hepatobiliary: No focal liver abnormality.No evidence of biliary obstruction or stone. Pancreas: Stable appearance.  No acute finding Spleen: Unremarkable. Adrenals/Urinary Tract: Negative adrenals. Smooth right renal atrophy, chronic and favoring arterial compromise. Stable appearance of the left kidney, including nearly 6 cm exophytic cyst. Unremarkable bladder. Stomach/Bowel: Extensive sigmoid diverticulosis. No appendicitis. No obstruction. 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 Similar measurements of the excluded aortic aneurysm sac. Chronic high-density lymph nodes ventral to the pancreas, benign given stability. Reproductive:No pathologic findings. Other: No ascites or pneumoperitoneum. Musculoskeletal: No acute or aggressive finding. Lumbar facet arthropathy with grade 1 anterolisthesis at L4-5 and L5-S1. IMPRESSION: 1. No acute finding to explain pain. 2. 8 mm left lower lobe pulmonary nodule which may be new from 2015. Recommend full chest CT when clinically appropriate. 3. Chronic findings are stable and described above. Electronically Signed   By: Monte Fantasia M.D.   On: 05/01/2016 15:24    Procedures Procedures (including critical care time)  Medications Ordered in ED Medications  iopamidol (ISOVUE-300) 61 % injection (not administered)  fentaNYL (SUBLIMAZE) injection 50 mcg (50 mcg Intravenous Given 05/01/16 1252)     Initial Impression / Assessment and Plan / ED Course  I have reviewed the triage vital signs and the nursing notes.  Pertinent labs &  imaging results that were available during my care of the patient were reviewed by me and considered in my medical decision making (see chart for details).  Clinical Course    Afebrile nontoxic patient with multiple medical problems including dementia, on blood thinners, p/w anal or rectal pain and decreased PO intake.  Labs at baseline. UA does not appear infected.  CT abd/pelvis negative.  Pt has GI follow up in 5 days.  Offered increased pain control for home, advised pt and daughter of risks of narcotic use including increased likelihood of falls, constipation.  Will add stool softener as well.  Patient and daughter agree to plan.  D/C home.  Discussed result, findings, treatment, and follow up  with patient and daughter.  Pt given return precautions.  Pt verbalizes understanding and agrees with plan.       Final Clinical Impressions(s) / ED Diagnoses   Final diagnoses:  Rectal pain    New Prescriptions New Prescriptions   DOCUSATE SODIUM (COLACE) 100 MG CAPSULE    Take 1 capsule (100 mg total) by mouth daily as needed for mild constipation or moderate constipation.   HYDROCODONE-ACETAMINOPHEN (NORCO/VICODIN) 5-325 MG TABLET    Take 1 tablet by mouth 3 (three) times daily as needed for moderate pain or severe pain.     Clayton Bibles, PA-C 05/01/16 1614    Julianne Rice, MD 05/02/16 [phone]

## 2016-05-01 NOTE — ED Triage Notes (Signed)
Pt here from home with daughter reporting rectal pain for 2 weeks. She reports she has seen her PCP and is to follow up with GI on Friday. Pt reports taking a tramadol for pain this morning.

## 2016-05-01 NOTE — Discharge Instructions (Signed)
Read the information below.  Use the prescribed medication as directed.  Please discuss all new medications with your pharmacist.  Do not take additional tylenol while taking the prescribed pain medication to avoid overdose.  You may return to the Emergency Department at any time for worsening condition or any new symptoms that concern you.    If you develop high fevers, worsening abdominal or rectal pain, uncontrolled vomiting, or are unable to tolerate fluids by mouth, return to the ER for a recheck.

## 2016-05-05 ENCOUNTER — Ambulatory Visit: Payer: Medicare Other

## 2016-05-06 ENCOUNTER — Ambulatory Visit (INDEPENDENT_AMBULATORY_CARE_PROVIDER_SITE_OTHER): Payer: Medicare Other | Admitting: Physician Assistant

## 2016-05-06 ENCOUNTER — Encounter: Payer: Self-pay | Admitting: Physician Assistant

## 2016-05-06 VITALS — BP 132/54 | HR 72 | Ht 64.0 in | Wt 141.0 lb

## 2016-05-06 DIAGNOSIS — K602 Anal fissure, unspecified: Secondary | ICD-10-CM

## 2016-05-06 DIAGNOSIS — K649 Unspecified hemorrhoids: Secondary | ICD-10-CM | POA: Diagnosis not present

## 2016-05-06 MED ORDER — AMBULATORY NON FORMULARY MEDICATION: 1 refills | Status: DC

## 2016-05-06 MED ORDER — NITROGLYCERIN 2 % TD OINT: 0.5000 [in_us] | TOPICAL_OINTMENT | Freq: Two times a day (BID) | TRANSDERMAL | 0 refills | Status: DC

## 2016-05-06 NOTE — Progress Notes (Signed)
Chief Complaint: Rectal pain  HPI:  Mrs.  Caroline Underwood is an 82 year old Caucasian female, with past medical history of PAD, IDA, osteoporosis, hyperlipidemia, A-flutter on Xarelto, GERD, dementia, CKD, COPD, who was referred to me by Lucretia Kern, DO for a complaint of rectal pain.    Review of chart shows that patient was seen in the ED for this pain on 05/01/16. At that time it was described that the rectal pain and been going on for 2 weeks and had been treated by her PCP as hemorrhoids and a skin yeast infection with some improvement in the visual aspect since treatment, but the pain had worsened. Patient's labs showed a creatinine elevated at 1.89 and a white count of 12.9 which is normal for the patient, but were otherwise normal. CT of the abdomen and pelvis was normal. Patient was started on some narcotics for pain control and a stool softener was added.  Today, the patient presented to clinic accompanied by her daughter who is her sole caretaker. The daughter tells me that over the past 2 weeks her mother has constantly been complaining of a "sore bottom". So much so that the patient has been wearing her headphones when in the same room with her mother as she just "doesn't stop complaining about it". The patient does have dementia so her history is limited but tells me that this is an "aching pain", that is constant and is only better if she takes the weight off of her bottom. Her daughter tells me the patient does have daily bowel movements, but does this unassisted, so she is unsure if she is constipated or has any difficulty. The patient is unable to tell me. Patient was recently given hydrocodone for pain, but the patient's daughter tells me this just makes her drowsy.  The patient's daughter added Colace so as to avoid constipation. The patient's appetite has also been somewhat decreased over the past week. They have been to see the primary care provider who gave them some hydrocortisone cream  for suspected hemorrhoid pain. Apparently this helped the visual aspect of her bottom, but has not helped the pain at all.  Of note the patient's daughter tells me that she is "quite the complainer and sometimes has exaggerated pain". Apparently she complains about pain at her podiatrist appointments or even the smallest bump.  Patient denies fever, chills, blood in her stool, melena, weight loss, nausea, vomiting, heartburn, reflux or abdominal pain.   Past Medical History:  Diagnosis Date  . Breast cancer ([place]) 01/02/2013  . Chronic kidney disease 01/02/2013   stage 3/4  . COPD (chronic obstructive pulmonary disease) ([place]) 01/02/2013  . Dementia, on Namenda briefly in the past per review of records 01/02/2013  . Essential hypertension, benign 01/02/2013  . GERD (gastroesophageal reflux disease) 01/02/2013  . Hx of atrial flutter, on Xarelto 01/02/2013  . Hyperlipemia 01/02/2013  . Iron deficiency anemia 01/02/2013  . Osteoporosis, unspecified 01/02/2013  . PAD (peripheral artery disease) ([place]) 01/02/2013  . Status post AAA (abdominal aortic aneurysm) repair 01/02/2013  . Unspecified hypothyroidism 01/02/2013    Past Surgical History:  Procedure Laterality Date  . ABDOMINAL AORTIC ANEURYSM REPAIR    . BREAST SURGERY     right lumpectomy    Current Outpatient Prescriptions  Medication Sig Dispense Refill  . ADVAIR DISKUS 100-50 MCG/DOSE AEPB INHALE ONE DOSE BY MOUTH EVERY 12 HOURS (Patient taking differently: Inhale 1 puff by mouth twice daily) 60 each 2  . amLODipine (NORVASC) 5  MG tablet TAKE ONE TABLET BY MOUTH ONCE DAILY (Patient taking differently: Take 5 mg by mouth twice daily) 90 tablet 1  . apixaban (ELIQUIS) 2.5 MG TABS tablet Take 1 tablet (2.5 mg total) by mouth 2 (two) times daily. 180 tablet 3  . atorvastatin (LIPITOR) 20 MG tablet TAKE ONE TABLET BY MOUTH ONCE DAILY AT  6PM (Patient taking differently: Take 20 mg by mouth daily at 1800) 90 tablet 0  . docusate sodium (COLACE)  100 MG capsule Take 1 capsule (100 mg total) by mouth daily as needed for mild constipation or moderate constipation. 30 capsule 0  . donepezil (ARICEPT) 10 MG tablet TAKE ONE TABLET BY MOUTH AT BEDTIME (Patient taking differently: Take 10 mg by mouth at bedtime) 90 tablet 1  . furosemide (LASIX) 40 MG tablet TAKE ONE TABLET BY MOUTH ONCE DAILY (Patient taking differently: Take 40 mg by mouth once daily) 90 tablet 1  . HYDROcodone-acetaminophen (NORCO/VICODIN) 5-325 MG tablet Take 1 tablet by mouth 3 (three) times daily as needed for moderate pain or severe pain. 15 tablet 0  . hydrocortisone (ANUSOL-HC) 2.5 % rectal cream Place 1 application rectally 2 (two) times daily. 30 g 0  . levothyroxine (SYNTHROID, LEVOTHROID) 25 MCG tablet TAKE ONE & ONE-HALF TABLETS BY MOUTH ONCE DAILY IN THE MORNING BEFORE BREAKFAST (Patient taking differently: Take 37.5 mcg by mouth once daily in the morning before breakfast) 135 tablet 1  . Melatonin 5 MG CAPS Take 5 mg by mouth at bedtime.     . metoprolol (LOPRESSOR) 25 MG tablet Take 1 tablet (25 mg total) by mouth 2 (two) times daily.    . phenylephrine-shark liver oil-mineral oil-petrolatum (PREPARATION H) 0.25-3-14-71.9 % rectal ointment Place 1 application rectally 2 (two) times daily.    . traMADol (ULTRAM) 50 MG tablet Take 1/2-1 tablet not more than every 12 hours as needed for pain (Patient taking differently: Take 50 mg by mouth daily as needed for moderate pain. ) 20 tablet 0  . vitamin C (ASCORBIC ACID) 500 MG tablet Take 500 mg by mouth daily.    . AMBULATORY NON FORMULARY MEDICATION Medication Name: Nitroglycerine Ointment 0.125mg  Pt is to apply BID to outer and inner rim of rectum for 6-8 weeks. 30 g 1  . nitroGLYCERIN (NITROGLYN) 2 % ointment Apply 0.5 inches topically 2 (two) times daily. 30 g 0   No current facility-administered medications for this visit.     Allergies as of 05/06/2016  . (No Known Allergies)    Family History  Problem  Relation Age of Onset  . Cancer Mother     lung  . Heart disease Father 40    Social History   Social History  . Marital status: Widowed    Spouse name: N/A  . Number of children: N/A  . Years of education: N/A   Occupational History  . Not on file.   Social History Main Topics  . Smoking status: Former Smoker    Packs/day: 0.50    Types: Cigarettes    Quit date: 02/16/2008  . Smokeless tobacco: Never Used  . Alcohol use No  . Drug use: No  . Sexual activity: No   Other Topics Concern  . Not on file   Social History Narrative   Home Situation: living with daughter [name])      Spiritual Beliefs: none      Lifestyle: get around well in the house - uses a walker sometimes, has not had a history of any  falls, has some mild dementia. She needs help with bathing. She needs some help with dressing. She does not do her own cooking or cleaning. Does not drive. She did manage all of her finances until 10/2012.             Review of Systems:     Constitutional: No weight loss, fever, chills, weakness or fatigue HEENT: Eyes: No change in vision               Ears, Nose, Throat:  No change in hearing or congestion Skin: No rash or itching Cardiovascular: No chest pain, chest pressure or palpitations   Respiratory: No SOB or cough Gastrointestinal: See HPI and otherwise negative Genitourinary: No dysuria or change in urinary frequency Neurological: No headache, dizziness or syncope Musculoskeletal: No new muscle or joint pain Hematologic: No bleeding or bruising Psychiatric: Positive for dementia No history of depression or anxiety   Physical Exam:  Vital signs: BP (!) 132/54   Pulse 72   Ht 5\' 4"  (1.626 m)   Wt 141 lb (64 kg)   BMI 24.20 kg/m   General:   Pleasant Elderly Caucasian female appears to be in distress, only comfortable laying on her side on the exam table, Well developed, Well nourished, alert and cooperative Head:  Normocephalic and atraumatic. Eyes:    PEERL, EOMI. No icterus. Conjunctiva pink. Ears:  Normal auditory acuity. Neck:  Supple Throat: Oral cavity and pharynx without inflammation, swelling or lesion.  Lungs: Respirations even and unlabored. Lungs clear to auscultation bilaterally.   No wheezes, crackles, or rhonchi.  Heart: Normal S1, S2. No MRG. Regular rate and rhythm. No peripheral edema, cyanosis or pallor.  Abdomen:  Soft, nondistended, nontender. No rebound or guarding. Normal bowel sounds. No appreciable masses or hepatomegaly. Rectal:  External exam reveals multiple external hemorrhoids as well as some erythema and irritation along with an anal fissure at least 2 cm in length and quite wide, patient is painful to touch in this exact spot; internal exam reveals some hemorrhoids, no mass Msk:  Symmetrical without gross deformities. Peripheral pulses intact.  Extremities:  Without edema, no deformity or joint abnormality.  Neurologic:  Alert and  oriented x4;  grossly normal neurologically.  Skin:   Dry and intact without significant lesions or rashes. Psychiatric: Oriented to person, place and time. Patient is demented and her history is limited  RECENT LABS AND IMAGING: CBC    Component Value Date/Time   WBC 12.9 (H) 05/01/2016 1205   RBC 4.54 05/01/2016 1205   HGB 14.1 05/01/2016 1205   HGB 12.7 02/05/2013 1551   HCT 41.8 05/01/2016 1205   HCT 37.9 02/05/2013 1551   PLT 208 05/01/2016 1205   PLT 211 02/05/2013 1551   MCV 92.1 05/01/2016 1205   MCV 92.7 02/05/2013 1551   MCH 31.1 05/01/2016 1205   MCHC 33.7 05/01/2016 1205   RDW 12.3 05/01/2016 1205   RDW 13.1 02/05/2013 1551   LYMPHSABS 2.3 05/01/2016 1205   LYMPHSABS 2.9 02/05/2013 1551   MONOABS 0.8 05/01/2016 1205   MONOABS 0.9 02/05/2013 1551   EOSABS 0.3 05/01/2016 1205   EOSABS 0.5 02/05/2013 1551   BASOSABS 0.1 05/01/2016 1205   BASOSABS 0.1 02/05/2013 1551    CMP     Component Value Date/Time   NA 137 05/01/2016 1205   NA 141 02/05/2013 1551     K 3.8 05/01/2016 1205   K 4.3 02/05/2013 1551   CL 101 05/01/2016 1205  CO2 24 05/01/2016 1205   CO2 30 (H) 02/05/2013 1551   GLUCOSE 137 (H) 05/01/2016 1205   GLUCOSE 100 02/05/2013 1551   BUN 17 05/01/2016 1205   BUN 42.9 (H) 02/05/2013 1551   CREATININE 1.89 (H) 05/01/2016 1205   CREATININE 1.76 (H) 07/22/2015 0942   CREATININE 1.9 (H) 02/05/2013 1551   CALCIUM 9.5 05/01/2016 1205   CALCIUM 9.6 02/05/2013 1551   PROT 6.4 04/26/2014 0343   PROT 7.5 02/05/2013 1551   ALBUMIN 3.1 (L) 04/26/2014 0343   ALBUMIN 3.7 02/05/2013 1551   AST 25 04/26/2014 0343   AST 26 02/05/2013 1551   ALT 12 04/26/2014 0343   ALT 19 02/05/2013 1551   ALKPHOS 64 04/26/2014 0343   ALKPHOS 76 02/05/2013 1551   BILITOT 0.9 04/26/2014 0343   BILITOT 0.21 02/05/2013 1551   GFRNONAA 22 (L) 05/01/2016 1205   GFRAA 25 (L) 05/01/2016 1205   CT ABDOMEN AND PELVIS WITHOUT CONTRAST 05/01/16  TECHNIQUE: Multidetector CT imaging of the abdomen and pelvis was performed following the standard protocol without IV contrast.  COMPARISON:  04/23/2014  FINDINGS: Lower chest: 8 mm lingular nodule. This was not visualized in 2015 at this size, even allowing for distortion by large pneumothorax at that time  Atherosclerosis of the coronary arteries. Lipomatous hypertrophy of the interatrial septum.  Hepatobiliary: No focal liver abnormality.No evidence of biliary obstruction or stone.  Pancreas: Stable appearance.  No acute finding  Spleen: Unremarkable.  Adrenals/Urinary Tract: Negative adrenals. Smooth right renal atrophy, chronic and favoring arterial compromise. Stable appearance of the left kidney, including nearly 6 cm exophytic cyst. Unremarkable bladder.  Stomach/Bowel: Extensive sigmoid diverticulosis. No appendicitis. No obstruction.  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 Similar measurements of the excluded aortic aneurysm sac. Chronic  high-density lymph nodes ventral to the pancreas, benign given stability.  Reproductive:No pathologic findings.  Other: No ascites or pneumoperitoneum.  Musculoskeletal: No acute or aggressive finding. Lumbar facet arthropathy with grade 1 anterolisthesis at L4-5 and L5-S1.  IMPRESSION: 1. No acute finding to explain pain. 2. 8 mm left lower lobe pulmonary nodule which may be new from 2015. Recommend full chest CT when clinically appropriate. 3. Chronic findings are stable and described above.   Electronically Signed   By: Monte Fantasia M.D.   On: 05/01/2016 15:24    Assessment: 1. Anal Fissure: Seen at time of exam today, quite large and at least 2 cm long 2. Hemorrhoids: Internal and external observed on exam today, no inflammation, likely fissure is source of pain 3. Rectal pain: Thought related to anal fissure, patient's daughter does affirm the patient does have a very low pain tolerance and complains even when she is going to the podiatrist to get her nails done, I am sure that this fissure is giving her a lot of discomfort.  Plan: 1. Prescribed nitroglycerin ointment 0.125% to be applied to the rectum twice a day 6-8 weeks with a gloved finger. Did discuss this with the patient's daughter. 2. Recommended sitz baths 3 times daily, patient's daughter tells me this will be difficult as every time she tries to do this, the patient defecates. 3. Recommend they buy over-the-counter Recta-care with lidocaine cream, this may be applied every 2-4 hours as needed for rectal discomfort 4. Discussed that we need to avoid constipation, recommend she take a daily dose of MiraLAX, they can titrate this as necessary to avoid diarrhea. 5. We did spend a long time discussing that this can take quite  a long time to heal even possibly more than 3 months, patient and daughter are aware. 6. Patient to follow in clinic in the future with myself or Dr. Carlean Purl in 3-4 weeks to assess  progress  Ellouise Newer, PA-C [facility] 05/06/2016, 1:19 PM  Cc: Lucretia Kern, DO

## 2016-05-06 NOTE — Patient Instructions (Addendum)
If you are age 80 or older, your body mass index should be between 23-30. Your Body mass index is 24.2 kg/m. If this is out of the aforementioned range listed, please consider follow up with your Primary Care Provider.  If you are age 65 or younger, your body mass index should be between 19-25. Your Body mass index is 24.2 kg/m. If this is out of the aformentioned range listed, please consider follow up with your Primary Care Provider.   We have sent the following medications to your pharmacy for you to pick up at your convenience: Nitroglycerin Ointment.  Please purchase Rectacare cream with Lidocaine  over the counter. Apply every 2-4 hours as needed for pain.  Please use Miralax as directed. Coupon given.  Please make a follow up appointment with Anderson Malta, PA or Dr. Carlean Purl in 4-6 weeks for a follow up.   Scheduled 06/06/16 at 10:45am with Anderson Malta, [place]

## 2016-05-09 NOTE — Progress Notes (Signed)
Agree w/ Ms. Lemmon's managament

## 2016-05-17 ENCOUNTER — Encounter: Payer: Self-pay | Admitting: Cardiovascular Disease

## 2016-05-18 ENCOUNTER — Encounter: Payer: Self-pay | Admitting: Family Medicine

## 2016-05-19 ENCOUNTER — Other Ambulatory Visit: Payer: Self-pay | Admitting: *Deleted

## 2016-05-19 MED ORDER — AMLODIPINE BESYLATE 5 MG PO TABS: ORAL_TABLET | ORAL | 3 refills | Status: DC

## 2016-05-19 MED ORDER — METOPROLOL TARTRATE 25 MG PO TABS: 25.0000 mg | ORAL_TABLET | Freq: Two times a day (BID) | ORAL | 3 refills | Status: DC

## 2016-05-19 NOTE — Telephone Encounter (Signed)
Rxs done and the pts daughter was notified via Mychart message.

## 2016-05-28 ENCOUNTER — Other Ambulatory Visit: Payer: Self-pay | Admitting: Family Medicine

## 2016-05-30 ENCOUNTER — Ambulatory Visit (INDEPENDENT_AMBULATORY_CARE_PROVIDER_SITE_OTHER): Payer: Medicare Other | Admitting: Neurology

## 2016-05-30 ENCOUNTER — Encounter: Payer: Self-pay | Admitting: Neurology

## 2016-05-30 VITALS — BP 134/48 | HR 74 | Wt 141.0 lb

## 2016-05-30 DIAGNOSIS — F0280 Dementia in other diseases classified elsewhere without behavioral disturbance: Secondary | ICD-10-CM | POA: Diagnosis not present

## 2016-05-30 DIAGNOSIS — G301 Alzheimer's disease with late onset: Secondary | ICD-10-CM | POA: Diagnosis not present

## 2016-05-30 NOTE — Progress Notes (Signed)
NEUROLOGY FOLLOW UP OFFICE NOTE  Caroline Underwood [id number]  HISTORY OF PRESENT ILLNESS: Caroline Underwood is a 82 y.o. female with hypertension, peripheral artery disease, chronic kidney disease, iron deficiency anemia, COPD, atrial fibrillation and history of right breast cancer who follows up for Alzheimer's disease. She is accompanied by her daughter who supplements history.     UPDATE: She is taking Aricept 10mg  daily and Namenda XR 28mg  daily.    Her daughter has retired and cares for her mother 24 hours a day.  Her son has since moved out.  She now requires assistance when bathing.  She is not combative or agitated.  She does not wander at night.  Appetite is good.  She denies depression.   HISTORY: She began experiencing symptoms many years ago. She was having problems with memory and often repeating questions, as well as forgetting tasks and misplacing objects. At some point, several years ago, she was started on Namenda by her primary care physician, but discontinued it at the request of her son. There has been a progressive decline in her condition. Her decline has significantly worsened over the past year, particularly following episodes of acute renal failure and hypertensive urgency.  She developed an episode of altered mental status, where she was walking around naked and then was laying on the couch for 2 days. She was also found to have a flutter and was started on Xarelto.  Her confusion has improved and she is functioning better, but definitely worse than before. She has good days and bad days.  She no longer is able to pay her bills.   PAST MEDICAL HISTORY: Past Medical History:  Diagnosis Date  . Breast cancer ([place]) 01/02/2013  . Chronic kidney disease 01/02/2013   stage 3/4  . COPD (chronic obstructive pulmonary disease) ([place]) 01/02/2013  . Dementia, on Namenda briefly in the past per review of records 01/02/2013  . Essential hypertension, benign 01/02/2013  . GERD  (gastroesophageal reflux disease) 01/02/2013  . Hx of atrial flutter, on Xarelto 01/02/2013  . Hyperlipemia 01/02/2013  . Iron deficiency anemia 01/02/2013  . Osteoporosis, unspecified 01/02/2013  . PAD (peripheral artery disease) ([place]) 01/02/2013  . Status post AAA (abdominal aortic aneurysm) repair 01/02/2013  . Unspecified hypothyroidism 01/02/2013    MEDICATIONS: Current Outpatient Prescriptions on File Prior to Visit  Medication Sig Dispense Refill  . ADVAIR DISKUS 100-50 MCG/DOSE AEPB INHALE ONE DOSE BY MOUTH EVERY 12 HOURS (Patient taking differently: Inhale 1 puff by mouth twice daily) 60 each 2  . AMBULATORY NON FORMULARY MEDICATION Medication Name: Nitroglycerine Ointment 0.125mg  Pt is to apply BID to outer and inner rim of rectum for 6-8 weeks. 30 g 1  . amLODipine (NORVASC) 5 MG tablet Take 5 mg by mouth twice daily 180 tablet 3  . apixaban (ELIQUIS) 2.5 MG TABS tablet Take 1 tablet (2.5 mg total) by mouth 2 (two) times daily. 180 tablet 3  . atorvastatin (LIPITOR) 20 MG tablet TAKE ONE TABLET BY MOUTH IN THE EVENING AT 6 PM 90 tablet 0  . docusate sodium (COLACE) 100 MG capsule Take 1 capsule (100 mg total) by mouth daily as needed for mild constipation or moderate constipation. 30 capsule 0  . donepezil (ARICEPT) 10 MG tablet TAKE ONE TABLET BY MOUTH AT BEDTIME (Patient taking differently: Take 10 mg by mouth at bedtime) 90 tablet 1  . furosemide (LASIX) 40 MG tablet TAKE ONE TABLET BY MOUTH ONCE DAILY (Patient taking differently: Take 40 mg by mouth once  daily) 90 tablet 1  . HYDROcodone-acetaminophen (NORCO/VICODIN) 5-325 MG tablet Take 1 tablet by mouth 3 (three) times daily as needed for moderate pain or severe pain. 15 tablet 0  . hydrocortisone (ANUSOL-HC) 2.5 % rectal cream Place 1 application rectally 2 (two) times daily. 30 g 0  . levothyroxine (SYNTHROID, LEVOTHROID) 25 MCG tablet TAKE ONE & ONE-HALF TABLETS BY MOUTH ONCE DAILY IN THE MORNING BEFORE BREAKFAST (Patient taking  differently: Take 37.5 mcg by mouth once daily in the morning before breakfast) 135 tablet 1  . Melatonin 5 MG CAPS Take 5 mg by mouth at bedtime.     . metoprolol tartrate (LOPRESSOR) 25 MG tablet Take 1 tablet (25 mg total) by mouth 2 (two) times daily. 180 tablet 3  . nitroGLYCERIN (NITROGLYN) 2 % ointment Apply 0.5 inches topically 2 (two) times daily. 30 g 0  . phenylephrine-shark liver oil-mineral oil-petrolatum (PREPARATION H) 0.25-3-14-71.9 % rectal ointment Place 1 application rectally 2 (two) times daily.    . traMADol (ULTRAM) 50 MG tablet Take 1/2-1 tablet not more than every 12 hours as needed for pain (Patient taking differently: Take 50 mg by mouth daily as needed for moderate pain. ) 20 tablet 0  . vitamin C (ASCORBIC ACID) 500 MG tablet Take 500 mg by mouth daily.     No current facility-administered medications on file prior to visit.     ALLERGIES: No Known Allergies  FAMILY HISTORY: Family History  Problem Relation Age of Onset  . Cancer Mother     lung  . Heart disease Father 55    SOCIAL HISTORY: Social History   Social History  . Marital status: Widowed    Spouse name: N/A  . Number of children: N/A  . Years of education: N/A   Occupational History  . Not on file.   Social History Main Topics  . Smoking status: Former Smoker    Packs/day: 0.50    Types: Cigarettes    Quit date: 02/16/2008  . Smokeless tobacco: Never Used  . Alcohol use No  . Drug use: No  . Sexual activity: No   Other Topics Concern  . Not on file   Social History Narrative   Home Situation: living with daughter Caroline Underwood)      Spiritual Beliefs: none      Lifestyle: get around well in the house - uses a walker sometimes, has not had a history of any falls, has some mild dementia. She needs help with bathing. She needs some help with dressing. She does not do her own cooking or cleaning. Does not drive. She did manage all of her finances until 10/2012.             REVIEW OF  SYSTEMS: Constitutional: No fevers, chills, or sweats, no generalized fatigue, change in appetite Eyes: No visual changes, double vision, eye pain Ear, nose and throat: No hearing loss, ear pain, nasal congestion, sore throat Cardiovascular: No chest pain, palpitations Respiratory:  No shortness of breath at rest or with exertion, wheezes GastrointestinaI: No nausea, vomiting, diarrhea, abdominal pain, fecal incontinence Genitourinary:  No dysuria, urinary retention or frequency Musculoskeletal:  No neck pain, back pain Integumentary: No rash, pruritus, skin lesions Neurological: as above Psychiatric: No depression, insomnia, anxiety Endocrine: No palpitations, fatigue, diaphoresis, mood swings, change in appetite, change in weight, increased thirst Hematologic/Lymphatic:  No purpura, petechiae. Allergic/Immunologic: no itchy/runny eyes, nasal congestion, recent allergic reactions, rashes  PHYSICAL EXAM: Vitals:   05/30/16 1402  BP: (!) 134/48  Pulse: 74   General: No acute distress.  Patient appears well-groomed.  normal body habitus. Head:  Normocephalic/atraumatic Eyes:  Fundi examined but not visualized Neck: supple, no paraspinal tenderness, full range of motion Heart:  Regular rate and rhythm Lungs:  Clear to auscultation bilaterally Back: No paraspinal tenderness Neurological Exam: alert and oriented to person only. Attention span and concentration intact, remote memory poor, remote memory intact, fund of knowledge impaired.  Speech fluent and not dysarthric, language intact.    Pupils pinpoint but round and equal.  CN II-XII intact. Fundi not visualized.  Bulk and tone normal, muscle strength 5/5 throughout.  Sensation to light touch  intact.  Deep tendon reflexes 2+ throughout.  Finger to nose intact.  Gait cautious with short strides.  IMPRESSION: Alzheimer's dementia  PLAN: Aricept and Namenda Will get Home Health evaluation Follow up in 6 months.  27 minutes spent  face to face with patient, over 50% spent counseling.  Metta Clines, DO  CC:  Colin Benton, DO

## 2016-05-30 NOTE — Patient Instructions (Signed)
1.  Continue Aricept 10mg  daily and Namenda XR 28mg  daily 2.  Will get Home Health evaluation 3.  Follow up in 6 months.

## 2016-06-02 ENCOUNTER — Telehealth: Payer: Self-pay | Admitting: Family Medicine

## 2016-06-02 NOTE — Telephone Encounter (Signed)
Spoke with daughter, [name] (DPR confirmed) and she stated they have completed advance directive documentation. Plans to change mother's status to DNR. Dr. Maudie Mercury is aware of meeting and is scheduled to see patient at 10:15am on Tuesday, November 22nd to discuss advance directives and comprehension of decisions. Patient and daughter will arrive at 10:00am to meet with me for review prior to appointment.

## 2016-06-02 NOTE — Telephone Encounter (Signed)
Pts daughter has her part of the paperwork completed and would like to meet with you so that you can complete your part.

## 2016-06-06 ENCOUNTER — Encounter: Payer: Self-pay | Admitting: Physician Assistant

## 2016-06-06 ENCOUNTER — Ambulatory Visit (INDEPENDENT_AMBULATORY_CARE_PROVIDER_SITE_OTHER): Payer: Medicare Other | Admitting: Physician Assistant

## 2016-06-06 VITALS — BP 130/60 | HR 82 | Ht 64.0 in | Wt 141.0 lb

## 2016-06-06 DIAGNOSIS — K6289 Other specified diseases of anus and rectum: Secondary | ICD-10-CM | POA: Diagnosis not present

## 2016-06-06 DIAGNOSIS — K602 Anal fissure, unspecified: Secondary | ICD-10-CM | POA: Diagnosis not present

## 2016-06-06 NOTE — Patient Instructions (Signed)
Follow up as needed

## 2016-06-06 NOTE — Progress Notes (Signed)
Chief Complaint: Anal Fissure  HPI:  Caroline Underwood is a 82 -year-old Caucasian female with past medical history of PAD, IDA, osteoporosis, hyperlipidemia, a flutter on Xarelto, GERD, dementia, CAD, COPD, who returns to clinic today for follow-up of her anal fissure. She was last seen in clinic on 05/06/2016 by myself and at that time described 2 weeks of constantly sore bottom to the point where she just couldn't sit on it. Her daughter who is her primary caretaker was along and told me that she was very frustrated. At that time, the patient was prescribed nitroglycerin ointment 0.125% to be applied twice a day 6-8 weeks, sitz baths were recommended 3 times a day and recta-care was recommended every 2-4 hours as needed. We also discussed that she should avoid constipation by taking daily dose of MiraLAX titrated to one solid bowel movement per day.   Today, the patient returns to clinic accompanied by her daughter who is her sole caretaker. Her daughter tells me that she applied nitroglycerin ointment twice a day for 3 weeks and recta care twice a day for 3 weeks, a week ago she started using both of these creams every other day as the patient was greatly improved. She tells me that her mother is no longer complaining of rectal pain and is able to sit on her bottom. Her bowel movements have also started to be less hard to pass with the MiraLax. The patient and her daughter are very thankful today.   Patient denies fever, chills, blood in her stool or abdominal pain.  Past Medical History:  Diagnosis Date  . Breast cancer ([place]) 01/02/2013  . Chronic kidney disease 01/02/2013   stage 3/4  . COPD (chronic obstructive pulmonary disease) ([place]) 01/02/2013  . Dementia, on Namenda briefly in the past per review of records 01/02/2013  . Essential hypertension, benign 01/02/2013  . GERD (gastroesophageal reflux disease) 01/02/2013  . Hx of atrial flutter, on Xarelto 01/02/2013  . Hyperlipemia 01/02/2013  . Iron  deficiency anemia 01/02/2013  . Osteoporosis, unspecified 01/02/2013  . PAD (peripheral artery disease) ([place]) 01/02/2013  . Status post AAA (abdominal aortic aneurysm) repair 01/02/2013  . Unspecified hypothyroidism 01/02/2013    Past Surgical History:  Procedure Laterality Date  . ABDOMINAL AORTIC ANEURYSM REPAIR    . BREAST SURGERY     right lumpectomy    Current Outpatient Prescriptions  Medication Sig Dispense Refill  . ADVAIR DISKUS 100-50 MCG/DOSE AEPB INHALE ONE DOSE BY MOUTH EVERY 12 HOURS (Patient taking differently: Inhale 1 puff by mouth twice daily) 60 each 2  . AMBULATORY NON FORMULARY MEDICATION Medication Name: Nitroglycerine Ointment 0.125mg  Pt is to apply BID to outer and inner rim of rectum for 6-8 weeks. 30 g 1  . amLODipine (NORVASC) 5 MG tablet Take 5 mg by mouth twice daily 180 tablet 3  . apixaban (ELIQUIS) 2.5 MG TABS tablet Take 1 tablet (2.5 mg total) by mouth 2 (two) times daily. 180 tablet 3  . atorvastatin (LIPITOR) 20 MG tablet TAKE ONE TABLET BY MOUTH IN THE EVENING AT 6 PM 90 tablet 0  . docusate sodium (COLACE) 100 MG capsule Take 1 capsule (100 mg total) by mouth daily as needed for mild constipation or moderate constipation. 30 capsule 0  . donepezil (ARICEPT) 10 MG tablet TAKE ONE TABLET BY MOUTH AT BEDTIME (Patient taking differently: Take 10 mg by mouth at bedtime) 90 tablet 1  . furosemide (LASIX) 40 MG tablet TAKE ONE TABLET BY MOUTH ONCE DAILY (  Patient taking differently: Take 40 mg by mouth once daily) 90 tablet 1  . HYDROcodone-acetaminophen (NORCO/VICODIN) 5-325 MG tablet Take 1 tablet by mouth 3 (three) times daily as needed for moderate pain or severe pain. 15 tablet 0  . hydrocortisone (ANUSOL-HC) 2.5 % rectal cream Place 1 application rectally 2 (two) times daily. 30 g 0  . levothyroxine (SYNTHROID, LEVOTHROID) 25 MCG tablet TAKE ONE & ONE-HALF TABLETS BY MOUTH ONCE DAILY IN THE MORNING BEFORE BREAKFAST (Patient taking differently: Take 37.5 mcg  by mouth once daily in the morning before breakfast) 135 tablet 1  . Melatonin 5 MG CAPS Take 5 mg by mouth at bedtime.     . metoprolol tartrate (LOPRESSOR) 25 MG tablet Take 1 tablet (25 mg total) by mouth 2 (two) times daily. 180 tablet 3  . nitroGLYCERIN (NITROGLYN) 2 % ointment Apply 0.5 inches topically 2 (two) times daily. 30 g 0  . phenylephrine-shark liver oil-mineral oil-petrolatum (PREPARATION H) 0.25-3-14-71.9 % rectal ointment Place 1 application rectally 2 (two) times daily.    . traMADol (ULTRAM) 50 MG tablet Take 1/2-1 tablet not more than every 12 hours as needed for pain (Patient taking differently: Take 50 mg by mouth daily as needed for moderate pain. ) 20 tablet 0  . vitamin C (ASCORBIC ACID) 500 MG tablet Take 500 mg by mouth daily.     No current facility-administered medications for this visit.     Allergies as of 06/06/2016  . (No Known Allergies)    Family History  Problem Relation Age of Onset  . Cancer Mother     lung  . Heart disease Father 74    Social History   Social History  . Marital status: Widowed    Spouse name: N/A  . Number of children: N/A  . Years of education: N/A   Occupational History  . Not on file.   Social History Main Topics  . Smoking status: Former Smoker    Packs/day: 0.50    Types: Cigarettes    Quit date: 02/16/2008  . Smokeless tobacco: Never Used  . Alcohol use No  . Drug use: No  . Sexual activity: No   Other Topics Concern  . Not on file   Social History Narrative   Home Situation: living with daughter Caroline Underwood)      Spiritual Beliefs: none      Lifestyle: get around well in the house - uses a walker sometimes, has not had a history of any falls, has some mild dementia. She needs help with bathing. She needs some help with dressing. She does not do her own cooking or cleaning. Does not drive. She did manage all of her finances until 10/2012.             Review of Systems:    Constitutional: No fever or  chills  Cardiovascular: No chest pain Gastrointestinal: See HPI and otherwise negative Psychiatric: No history of depression or anxiety   Physical Exam:  Vital signs: BP 130/60   Pulse 82   Ht 5\' 4"  (1.626 m)   Wt 141 lb (64 kg)   BMI 24.20 kg/m    Constitutional:   Pleasant Caucasian female appears to be in NAD, Well developed, Well nourished, alert and cooperative Respiratory: Respirations even and unlabored. Lungs clear to auscultation bilaterally.   No wheezes, crackles, or rhonchi.  Cardiovascular: Normal S1, S2. No MRG. Regular rate and rhythm. No peripheral edema, cyanosis or pallor.  Gastrointestinal:  Soft, nondistended, nontender. No  rebound or guarding. Normal bowel sounds. No appreciable masses or hepatomegaly. Rectal:  External exam: Hemorrhoids 2, small anal fissure, much improved, still slightly tender to palpation  Assessment: 1. Anal Fissure: Much improved after a month of nitroglycerin ointment twice a day and recta-care when necessary, on exam today fissure is still present, though much improved, 2 external hemorrhoids 2. Rectal pain: Much improved with above  Plan: 1. Continue twice a day nitroglycerin ointment for another month, recta care when necessary 2. Patient to return to clinic as needed in the future.  Ellouise Newer, PA-C [facility] 06/06/2016, 10:45 AM  Cc: Lucretia Kern, DO

## 2016-06-06 NOTE — Progress Notes (Signed)
HPI:  Caroline Underwood is a pleasant 82 yo with extensive PMH sig for dementia, PAD, HTN, A. Fib, COPD, GERD, Hypothyroidism, HLD and hx of breast CA here to complete advanced directives. She met with our trained RN to review options and materials with her and her daughter. She confirms " if my heart stops working or my lungs stop working, just let me go!" Reviewed and confirmed her wishes and form DNR completed.  ROS: See pertinent positives and negatives per HPI.  Past Medical History:  Diagnosis Date  . Breast cancer ([place]) 01/02/2013  . Chronic kidney disease 01/02/2013   stage 3/4  . COPD (chronic obstructive pulmonary disease) ([place]) 01/02/2013  . Dementia, on Namenda briefly in the past per review of records 01/02/2013  . Essential hypertension, benign 01/02/2013  . GERD (gastroesophageal reflux disease) 01/02/2013  . Hx of atrial flutter, on Xarelto 01/02/2013  . Hyperlipemia 01/02/2013  . Iron deficiency anemia 01/02/2013  . Osteoporosis, unspecified 01/02/2013  . PAD (peripheral artery disease) ([place]) 01/02/2013  . Status post AAA (abdominal aortic aneurysm) repair 01/02/2013  . Unspecified hypothyroidism 01/02/2013    Past Surgical History:  Procedure Laterality Date  . ABDOMINAL AORTIC ANEURYSM REPAIR    . BREAST SURGERY     right lumpectomy    Family History  Problem Relation Age of Onset  . Cancer Mother     lung  . Heart disease Father 69    Social History   Social History  . Marital status: Widowed    Spouse name: N/A  . Number of children: 3  . Years of education: N/A   Occupational History  . retired    Social History Main Topics  . Smoking status: Former Smoker    Packs/day: 0.50    Types: Cigarettes    Quit date: 02/16/2008  . Smokeless tobacco: Never Used  . Alcohol use No  . Drug use: No  . Sexual activity: No   Other Topics Concern  . Not on file   Social History Narrative   Home Situation: living with daughter [name])      Spiritual Beliefs:  none      Lifestyle: get around well in the house - uses a walker sometimes, has not had a history of any falls, has some mild dementia. She needs help with bathing. She needs some help with dressing. She does not do her own cooking or cleaning. Does not drive. She did manage all of her finances until 10/2012.              Current Outpatient Prescriptions:  .  ADVAIR DISKUS 100-50 MCG/DOSE AEPB, INHALE ONE DOSE BY MOUTH EVERY 12 HOURS (Patient taking differently: Inhale 1 puff by mouth twice daily), Disp: 60 each, Rfl: 2 .  AMBULATORY NON FORMULARY MEDICATION, Medication Name: Nitroglycerine Ointment 0.173m Pt is to apply BID to outer and inner rim of rectum for 6-8 weeks., Disp: 30 g, Rfl: 1 .  amLODipine (NORVASC) 5 MG tablet, Take 5 mg by mouth twice daily, Disp: 180 tablet, Rfl: 3 .  apixaban (ELIQUIS) 2.5 MG TABS tablet, Take 1 tablet (2.5 mg total) by mouth 2 (two) times daily., Disp: 180 tablet, Rfl: 3 .  atorvastatin (LIPITOR) 20 MG tablet, TAKE ONE TABLET BY MOUTH IN THE EVENING AT 6 PM, Disp: 90 tablet, Rfl: 0 .  docusate sodium (COLACE) 100 MG capsule, Take 1 capsule (100 mg total) by mouth daily as needed for mild constipation or moderate constipation., Disp: 30 capsule, Rfl:  0 .  donepezil (ARICEPT) 10 MG tablet, TAKE ONE TABLET BY MOUTH AT BEDTIME (Patient taking differently: Take 10 mg by mouth at bedtime), Disp: 90 tablet, Rfl: 1 .  furosemide (LASIX) 40 MG tablet, TAKE ONE TABLET BY MOUTH ONCE DAILY (Patient taking differently: Take 40 mg by mouth once daily), Disp: 90 tablet, Rfl: 1 .  HYDROcodone-acetaminophen (NORCO/VICODIN) 5-325 MG tablet, Take 1 tablet by mouth 3 (three) times daily as needed for moderate pain or severe pain., Disp: 15 tablet, Rfl: 0 .  hydrocortisone (ANUSOL-HC) 2.5 % rectal cream, Place 1 application rectally 2 (two) times daily., Disp: 30 g, Rfl: 0 .  levothyroxine (SYNTHROID, LEVOTHROID) 25 MCG tablet, TAKE ONE & ONE-HALF TABLETS BY MOUTH ONCE DAILY IN  THE MORNING BEFORE BREAKFAST (Patient taking differently: Take 37.5 mcg by mouth once daily in the morning before breakfast), Disp: 135 tablet, Rfl: 1 .  Melatonin 5 MG CAPS, Take 5 mg by mouth at bedtime. , Disp: , Rfl:  .  metoprolol tartrate (LOPRESSOR) 25 MG tablet, Take 1 tablet (25 mg total) by mouth 2 (two) times daily., Disp: 180 tablet, Rfl: 3 .  nitroGLYCERIN (NITROGLYN) 2 % ointment, Apply 0.5 inches topically 2 (two) times daily., Disp: 30 g, Rfl: 0 .  phenylephrine-shark liver oil-mineral oil-petrolatum (PREPARATION H) 0.25-3-14-71.9 % rectal ointment, Place 1 application rectally 2 (two) times daily., Disp: , Rfl:  .  traMADol (ULTRAM) 50 MG tablet, Take 1/2-1 tablet not more than every 12 hours as needed for pain (Patient taking differently: Take 50 mg by mouth daily as needed for moderate pain. ), Disp: 20 tablet, Rfl: 0 .  vitamin C (ASCORBIC ACID) 500 MG tablet, Take 500 mg by mouth daily., Disp: , Rfl:   EXAM:  There were no vitals filed for this visit.  There is no height or weight on file to calculate BMI.  GENERAL: vitals reviewed and listed above, alert, oriented, appears well hydrated and in no acute distress  MS: moves all extremities without noticeable abnormality  PSYCH: pleasant and cooperative, no obvious depression or anxiety  ASSESSMENT AND PLAN:  Discussed the following assessment and plan:  No diagnosis found.  -DNR form completed -Patient advised to return or notify a doctor immediately if symptoms worsen or persist or new concerns arise.  There are no Patient Instructions on file for this visit.  [name], DO

## 2016-06-07 ENCOUNTER — Ambulatory Visit (INDEPENDENT_AMBULATORY_CARE_PROVIDER_SITE_OTHER): Payer: Medicare Other | Admitting: Family Medicine

## 2016-06-07 DIAGNOSIS — Z789 Other specified health status: Secondary | ICD-10-CM

## 2016-06-07 NOTE — Progress Notes (Signed)
Met with patient and daughter 15 minutes prior to their appointment with Dr. Maudie Mercury. Daughter provided original copy of Advance Directive packet. Signatures and initials were reviewed in packet, including notary stamp. Discussed with patient and daughter the understanding of each category and what they have signed/agreed upon. Patient nor daughter had questions at that time. Presented patient and daughter with blank DNR form and made them aware Dr. Maudie Mercury will be reviewing the form and will be having a face-to-face discussion with them before she will sign it. Both patient and daughter verbally stated understanding of instructions. Patient appeared stable and in no acute distress.   Advance Directive was scanned into the chart. Both Advance Directive and completed DNR form were given to patient and daughter.

## 2016-06-12 NOTE — Progress Notes (Signed)
Glad to see she is better. Agree with Ms. Mort Sawyers evaluation and management.

## 2016-06-15 ENCOUNTER — Other Ambulatory Visit: Payer: Self-pay | Admitting: Cardiovascular Disease

## 2016-06-17 ENCOUNTER — Emergency Department (HOSPITAL_COMMUNITY): Payer: Medicare Other

## 2016-06-17 ENCOUNTER — Inpatient Hospital Stay (HOSPITAL_COMMUNITY)
Admission: EM | Admit: 2016-06-17 | Discharge: 2016-06-21 | DRG: 481 | Disposition: A | Discharge status: Skilled Nursing Facility | Payer: Medicare Other | Attending: Internal Medicine | Admitting: Internal Medicine

## 2016-06-17 DIAGNOSIS — I482 Chronic atrial fibrillation: Secondary | ICD-10-CM | POA: Diagnosis not present

## 2016-06-17 DIAGNOSIS — S72142A Displaced intertrochanteric fracture of left femur, initial encounter for closed fracture: Principal | ICD-10-CM | POA: Diagnosis present

## 2016-06-17 DIAGNOSIS — D62 Acute posthemorrhagic anemia: Secondary | ICD-10-CM | POA: Diagnosis not present

## 2016-06-17 DIAGNOSIS — E876 Hypokalemia: Secondary | ICD-10-CM | POA: Diagnosis present

## 2016-06-17 DIAGNOSIS — F0280 Dementia in other diseases classified elsewhere without behavioral disturbance: Secondary | ICD-10-CM | POA: Diagnosis present

## 2016-06-17 DIAGNOSIS — Z8249 Family history of ischemic heart disease and other diseases of the circulatory system: Secondary | ICD-10-CM

## 2016-06-17 DIAGNOSIS — J438 Other emphysema: Secondary | ICD-10-CM

## 2016-06-17 DIAGNOSIS — M810 Age-related osteoporosis without current pathological fracture: Secondary | ICD-10-CM | POA: Diagnosis present

## 2016-06-17 DIAGNOSIS — K5900 Constipation, unspecified: Secondary | ICD-10-CM | POA: Diagnosis present

## 2016-06-17 DIAGNOSIS — I129 Hypertensive chronic kidney disease with stage 1 through stage 4 chronic kidney disease, or unspecified chronic kidney disease: Secondary | ICD-10-CM | POA: Diagnosis present

## 2016-06-17 DIAGNOSIS — E785 Hyperlipidemia, unspecified: Secondary | ICD-10-CM | POA: Diagnosis present

## 2016-06-17 DIAGNOSIS — E039 Hypothyroidism, unspecified: Secondary | ICD-10-CM | POA: Diagnosis present

## 2016-06-17 DIAGNOSIS — Y998 Other external cause status: Secondary | ICD-10-CM

## 2016-06-17 DIAGNOSIS — N184 Chronic kidney disease, stage 4 (severe): Secondary | ICD-10-CM | POA: Diagnosis present

## 2016-06-17 DIAGNOSIS — I4891 Unspecified atrial fibrillation: Secondary | ICD-10-CM | POA: Diagnosis present

## 2016-06-17 DIAGNOSIS — Z853 Personal history of malignant neoplasm of breast: Secondary | ICD-10-CM

## 2016-06-17 DIAGNOSIS — J449 Chronic obstructive pulmonary disease, unspecified: Secondary | ICD-10-CM | POA: Diagnosis present

## 2016-06-17 DIAGNOSIS — Z79899 Other long term (current) drug therapy: Secondary | ICD-10-CM

## 2016-06-17 DIAGNOSIS — Z801 Family history of malignant neoplasm of trachea, bronchus and lung: Secondary | ICD-10-CM

## 2016-06-17 DIAGNOSIS — K219 Gastro-esophageal reflux disease without esophagitis: Secondary | ICD-10-CM | POA: Diagnosis present

## 2016-06-17 DIAGNOSIS — I10 Essential (primary) hypertension: Secondary | ICD-10-CM

## 2016-06-17 DIAGNOSIS — Y9389 Activity, other specified: Secondary | ICD-10-CM

## 2016-06-17 DIAGNOSIS — W010XXA Fall on same level from slipping, tripping and stumbling without subsequent striking against object, initial encounter: Secondary | ICD-10-CM | POA: Diagnosis not present

## 2016-06-17 DIAGNOSIS — Z9181 History of falling: Secondary | ICD-10-CM | POA: Diagnosis not present

## 2016-06-17 DIAGNOSIS — D72829 Elevated white blood cell count, unspecified: Secondary | ICD-10-CM | POA: Diagnosis present

## 2016-06-17 DIAGNOSIS — Z23 Encounter for immunization: Secondary | ICD-10-CM | POA: Diagnosis present

## 2016-06-17 DIAGNOSIS — T148XXA Other injury of unspecified body region, initial encounter: Secondary | ICD-10-CM

## 2016-06-17 DIAGNOSIS — I480 Paroxysmal atrial fibrillation: Secondary | ICD-10-CM | POA: Diagnosis present

## 2016-06-17 DIAGNOSIS — Z87891 Personal history of nicotine dependence: Secondary | ICD-10-CM | POA: Diagnosis not present

## 2016-06-17 DIAGNOSIS — Z66 Do not resuscitate: Secondary | ICD-10-CM | POA: Diagnosis present

## 2016-06-17 DIAGNOSIS — Z7901 Long term (current) use of anticoagulants: Secondary | ICD-10-CM | POA: Diagnosis not present

## 2016-06-17 DIAGNOSIS — I739 Peripheral vascular disease, unspecified: Secondary | ICD-10-CM | POA: Diagnosis present

## 2016-06-17 DIAGNOSIS — Y92019 Unspecified place in single-family (private) house as the place of occurrence of the external cause: Secondary | ICD-10-CM | POA: Diagnosis not present

## 2016-06-17 DIAGNOSIS — M25552 Pain in left hip: Secondary | ICD-10-CM | POA: Diagnosis present

## 2016-06-17 DIAGNOSIS — Z9989 Dependence on other enabling machines and devices: Secondary | ICD-10-CM | POA: Diagnosis not present

## 2016-06-17 DIAGNOSIS — G309 Alzheimer's disease, unspecified: Secondary | ICD-10-CM | POA: Diagnosis present

## 2016-06-17 DIAGNOSIS — S72002A Fracture of unspecified part of neck of left femur, initial encounter for closed fracture: Secondary | ICD-10-CM

## 2016-06-17 DIAGNOSIS — Z8679 Personal history of other diseases of the circulatory system: Secondary | ICD-10-CM | POA: Diagnosis not present

## 2016-06-17 DIAGNOSIS — N189 Chronic kidney disease, unspecified: Secondary | ICD-10-CM | POA: Diagnosis present

## 2016-06-17 DIAGNOSIS — Z7951 Long term (current) use of inhaled steroids: Secondary | ICD-10-CM

## 2016-06-17 DIAGNOSIS — S72009A Fracture of unspecified part of neck of unspecified femur, initial encounter for closed fracture: Secondary | ICD-10-CM | POA: Diagnosis present

## 2016-06-17 LAB — PROTIME-INR
INR: 1.09
PROTHROMBIN TIME: 14.1 s (ref 11.4–15.2)

## 2016-06-17 LAB — COMPREHENSIVE METABOLIC PANEL
ALT: 14 U/L (ref 14–54)
ANION GAP: 11 (ref 5–15)
AST: 21 U/L (ref 15–41)
Albumin: 3.3 g/dL — ABNORMAL LOW (ref 3.5–5.0)
Alkaline Phosphatase: 102 U/L (ref 38–126)
BILIRUBIN TOTAL: 0.4 mg/dL (ref 0.3–1.2)
BUN: 15 mg/dL (ref 6–20)
CO2: 25 mmol/L (ref 22–32)
Calcium: 8.7 mg/dL — ABNORMAL LOW (ref 8.9–10.3)
Chloride: 103 mmol/L (ref 101–111)
Creatinine, Ser: 1.6 mg/dL — ABNORMAL HIGH (ref 0.44–1.00)
GFR, EST AFRICAN AMERICAN: 31 mL/min — AB (ref >60)
GFR, EST NON AFRICAN AMERICAN: 27 mL/min — AB (ref >60)
Glucose, Bld: 178 mg/dL — ABNORMAL HIGH (ref 65–99)
POTASSIUM: 3.2 mmol/L — AB (ref 3.5–5.1)
Sodium: 139 mmol/L (ref 135–145)
TOTAL PROTEIN: 6.4 g/dL — AB (ref 6.5–8.1)

## 2016-06-17 LAB — CBC WITH DIFFERENTIAL/PLATELET
Basophils Absolute: 0.1 10*3/uL (ref 0.0–0.1)
Basophils Relative: 0 %
EOS PCT: 2 %
Eosinophils Absolute: 0.3 10*3/uL (ref 0.0–0.7)
HCT: 37.8 % (ref 36.0–46.0)
Hemoglobin: 12.9 g/dL (ref 12.0–15.0)
LYMPHS PCT: 18 %
Lymphs Abs: 2.5 10*3/uL (ref 0.7–4.0)
MCH: 31.1 pg (ref 26.0–34.0)
MCHC: 34.1 g/dL (ref 30.0–36.0)
MCV: 91.1 fL (ref 78.0–100.0)
MONO ABS: 0.9 10*3/uL (ref 0.1–1.0)
MONOS PCT: 6 %
NEUTROS ABS: 10.1 10*3/uL — AB (ref 1.7–7.7)
Neutrophils Relative %: 74 %
Platelets: 218 10*3/uL (ref 150–400)
RBC: 4.15 MIL/uL (ref 3.87–5.11)
RDW: 12.1 % (ref 11.5–15.5)
WBC: 13.8 10*3/uL — ABNORMAL HIGH (ref 4.0–10.5)

## 2016-06-17 LAB — APTT: APTT: 37 s — AB (ref 24–36)

## 2016-06-17 MED ORDER — IBUPROFEN 400 MG PO TABS: 400.0000 mg | ORAL_TABLET | Freq: Four times a day (QID) | ORAL | 0 refills | Status: DC | PRN

## 2016-06-17 MED ORDER — MORPHINE SULFATE (PF) 4 MG/ML IV SOLN
2.0000 mg | Freq: Once | INTRAVENOUS | Status: AC
  Administered 2016-06-17: 2 mg via INTRAVENOUS
  Filled 2016-06-17: qty 1

## 2016-06-17 MED ORDER — MORPHINE SULFATE (PF) 4 MG/ML IV SOLN: 2.0000 mg | Freq: Once | INTRAVENOUS | Status: DC

## 2016-06-17 NOTE — ED Notes (Signed)
Per family pt with hx of dementia and was walking with her and tripped and fell. Pt is on Eliquis. Pt reports she can not move her Left leg.

## 2016-06-17 NOTE — Progress Notes (Signed)
Patient ID: Caroline Underwood, female   DOB: 1921/09/19, 82 y.o.   MRN: [id number] I have reviewed the patients x-rays.  She does have a left hip intertrochanteric fracture.  The usual treatment is surgery with a rod/hip screw thru small incisions.  She is on Xarelto, so will need to delay surgery until tomorrow vs Sunday pending evaluation and recommendation of the Medicine Service.

## 2016-06-17 NOTE — H&P (Signed)
[facility] History and Physical  Caroline Underwood [id number] DOB: [birth year] DOA: 06/17/2016  Referring physician: Dr. Zenia Resides PCP: Lucretia Kern., DO   Chief Complaint: Fall, L hip pain  HPI: Caroline Underwood is a 82 y.o. female with history of breast cancer, PVD/ AAA repair, HTN, dementia, CKD3/4 and COPD who presented to ED today after falling at home.  EMS called and brought to ED w L hip pain and unable to move the left leg.  Also hit her head.  Xrays showed L hip fracture.  CT head neg for bleed and CT C-spine neg for fracture.  Asked to see for admission. Pt takes Eliquis - ortho said they would need to hold off on surgery until Eliquis out of the system. Planning OR on Sunday per ED MD report.    Daughter provides history.  They are from [place], she is widowed, her husband died ~ 45, he was a Agricultural consultant in [place].  Pt has Alzheimer's dementia, daughter looks after her.  Walks with a walker.  Has COPD, quit smoking years ago.  Has afib/flutter on Xarelto.  Signer papers for DNR last week.   Takes colace once daily o/w will get constipated. Uses diapers at home.    Had lumpectomy for breast Ca 3 yrs ago in [place], this was before the AD set in.  Had AAA repair as well some yrs ago.  3 years ago fell and went to [facility] for rehab.    ROS  denies CP  no joint pain   no HA  no blurry vision  no rash  no diarrhea  no nausea/ vomiting  no dysuria  no difficulty voiding  no change in urine color    Past Medical History  Past Medical History:  Diagnosis Date  . Breast cancer ([place]) 01/02/2013  . Chronic kidney disease 01/02/2013   stage 3/4  . COPD (chronic obstructive pulmonary disease) ([place]) 01/02/2013  . Dementia, on Namenda briefly in the past per review of records 01/02/2013  . Essential hypertension, benign 01/02/2013  . GERD (gastroesophageal reflux disease) 01/02/2013  . Hx of atrial flutter, on Xarelto 01/02/2013  . Hyperlipemia 01/02/2013  .  Iron deficiency anemia 01/02/2013  . Osteoporosis, unspecified 01/02/2013  . PAD (peripheral artery disease) ([place]) 01/02/2013  . Status post AAA (abdominal aortic aneurysm) repair 01/02/2013  . Unspecified hypothyroidism 01/02/2013   Past Surgical History  Past Surgical History:  Procedure Laterality Date  . ABDOMINAL AORTIC ANEURYSM REPAIR    . BREAST SURGERY     right lumpectomy   Family History  Family History  Problem Relation Age of Onset  . Cancer Mother     lung  . Heart disease Father 74   Social History  reports that she quit smoking about 8 years ago. Her smoking use included Cigarettes. She smoked 0.50 packs per day. She has never used smokeless tobacco. She reports that she does not drink alcohol or use drugs. Allergies No Known Allergies Home medications Prior to Admission medications   Medication Sig Start Date End Date Taking? Authorizing Provider  ADVAIR DISKUS 100-50 MCG/DOSE AEPB INHALE ONE DOSE BY MOUTH EVERY 12 HOURS Patient taking differently: Inhale 1 puff by mouth twice daily 04/13/16  Yes Eulas Post, MD  amLODipine (NORVASC) 5 MG tablet Take 5 mg by mouth twice daily 05/19/16  Yes Lucretia Kern, DO  atorvastatin (LIPITOR) 20 MG tablet TAKE ONE TABLET BY MOUTH IN THE EVENING AT 6 PM Patient taking differently: TAKE ONE  TABLET BY MOUTH IN THE AFTERNOON 05/30/16  Yes Lucretia Kern, DO  docusate sodium (COLACE) 100 MG capsule Take 1 capsule (100 mg total) by mouth daily as needed for mild constipation or moderate constipation. Patient taking differently: Take 100 mg by mouth every morning.  05/01/16  Yes Clayton Bibles, PA-C  donepezil (ARICEPT) 10 MG tablet TAKE ONE TABLET BY MOUTH AT BEDTIME Patient taking differently: Take 10 mg by mouth at bedtime 03/16/16  Yes Adam Telford Nab, DO  ELIQUIS 2.5 MG TABS tablet TAKE ONE TABLET BY MOUTH TWICE DAILY 06/15/16  Yes Thayer Headings, MD  furosemide (LASIX) 40 MG tablet TAKE ONE TABLET BY MOUTH ONCE DAILY Patient taking  differently: Take 40 mg by mouth once daily 01/28/16  Yes Lucretia Kern, DO  levothyroxine (SYNTHROID, LEVOTHROID) 25 MCG tablet TAKE ONE & ONE-HALF TABLETS BY MOUTH ONCE DAILY IN THE MORNING BEFORE BREAKFAST Patient taking differently: Take 37.5 mcg by mouth once daily in the morning before breakfast 04/18/16  Yes Lucretia Kern, DO  Melatonin 5 MG CAPS Take 5 mg by mouth at bedtime.    Yes Historical Provider, MD  metoprolol tartrate (LOPRESSOR) 25 MG tablet Take 1 tablet (25 mg total) by mouth 2 (two) times daily. 05/19/16  Yes Lucretia Kern, DO  vitamin C (ASCORBIC ACID) 500 MG tablet Take 500 mg by mouth daily.   Yes Historical Provider, MD   Liver Function Tests  Recent Labs Lab 06/17/16 1935  AST 21  ALT 14  ALKPHOS 102  BILITOT 0.4  PROT 6.4*  ALBUMIN 3.3*   No results for input(s): LIPASE, AMYLASE in the last 168 hours. CBC  Recent Labs Lab 06/17/16 1935  WBC 13.8*  NEUTROABS 10.1*  HGB 12.9  HCT 37.8  MCV 91.1  PLT 99991111   Basic Metabolic Panel  Recent Labs Lab 06/17/16 1935  NA 139  K 3.2*  CL 103  CO2 25  GLUCOSE 178*  BUN 15  CREATININE 1.60*  CALCIUM 8.7*     Vitals:   06/17/16 2025 06/17/16 2030 06/17/16 2130 06/17/16 2200  BP:  152/72 143/59 137/75  Pulse: 75 70 81 82  Resp:   15 12  Temp:      TempSrc:      SpO2: 97% 97% 97% 97%   Exam: Gen elderly female, chron ill appearing, no distress, pleasant No rash, cyanosis or gangrene Sclera anicteric, throat clear  No jvd or bruits Chest clear bilat RRR no MRG Abd soft ntnd no mass or ascites +bs GU deferred MS no joint effusions; left leg shorter than R and turned out Ext no LE edema / no wounds or ulcers Neuro is alert, NF, gen'd weakness and some memory deficits  Na 139 Cr 1.60 Ca 8.7   Alb 3.3  LFT's ok  WBC 13k  Hb 12.9   EKG (independ reviewed) > NSR 80's, nonspecific changes CXR (independ reviewed) > no acute disease   Assessment: 1. Mechanical fall/ L hip fracture - per ortho,  plan OR Sunday 2. Aflutter - holding Eliquis, consult pharm 3. Dementia - walks w walker 4. Hist breast cancer 5. CKD stage 3/4 6. Hx AAA repair 7. DNR 8. HTN - cont norvasc, MTP, give half dose lasix  Plan - admit, reg diet, gentle IVF's, await plans per ortho     [place] D [facility] Pager [phone]   If 7PM-7AM, please contact night-coverage [email or web address] Password [email or web address] 06/17/2016, 10:50 PM

## 2016-06-17 NOTE — ED Notes (Signed)
Patient transported to X-ray 

## 2016-06-17 NOTE — ED Provider Notes (Signed)
[facility] DEPT Provider Note   CSN: [id number] Arrival date & time: 06/17/16  1911     History   Chief Complaint Chief Complaint  Patient presents with  . Fall    HPI  Blood pressure 158/70, pulse 75, temperature 97.3 F (36.3 C), temperature source Oral, resp. rate 17, SpO2 97 %.  Caroline Underwood is a 82 y.o. female with past medical history significant for chronic kidney disease, COPD, Alzheimer's dementia, a flutter, taking Eliquis, hyperlipidemia and PAD complaining of fall earlier in the evening. As per her daughter who supplies most of the history she states that the mother was looking outside the window, she is entertained by spine on the neighbors at 6 for favor pastimes, she turned away from the window and had a mechanical fall, unsure if she hit her head the fall was not witnessed but daughter states that she yelled immediately that she had fallen. She's been nonambulatory since the event. No complaints via patient. Level V caveat secondary to dementia. Does not have local orthopedist.  PCP: Rodena Goldmann Cardiology: Cathie Olden  Past Medical History:  Diagnosis Date  . Breast cancer ([place]) 01/02/2013  . Chronic kidney disease 01/02/2013   stage 3/4  . COPD (chronic obstructive pulmonary disease) ([place]) 01/02/2013  . Dementia, on Namenda briefly in the past per review of records 01/02/2013  . Essential hypertension, benign 01/02/2013  . GERD (gastroesophageal reflux disease) 01/02/2013  . Hx of atrial flutter, on Xarelto 01/02/2013  . Hyperlipemia 01/02/2013  . Iron deficiency anemia 01/02/2013  . Osteoporosis, unspecified 01/02/2013  . PAD (peripheral artery disease) ([place]) 01/02/2013  . Status post AAA (abdominal aortic aneurysm) repair 01/02/2013  . Unspecified hypothyroidism 01/02/2013    Patient Active Problem List   Diagnosis Date Noted  . Closed fracture of femur, intertrochanteric, left, initial encounter ([place]) 06/17/2016  . Alzheimer's disease 01/23/2013  . Breast  cancer ([place]) 01/02/2013  . Essential hypertension, benign 01/02/2013  . Atrial fibrillation ([place]) 01/02/2013  . Hyperlipemia 01/02/2013  . Osteoporosis 01/02/2013  . PAD (peripheral artery disease) ([place]) 01/02/2013  . Chronic kidney disease 01/02/2013  . Hypothyroidism 01/02/2013  . GERD (gastroesophageal reflux disease) 01/02/2013  . COPD (chronic obstructive pulmonary disease) ([place]) 01/02/2013    Past Surgical History:  Procedure Laterality Date  . ABDOMINAL AORTIC ANEURYSM REPAIR    . BREAST SURGERY     right lumpectomy    OB History    No data available       Home Medications    Prior to Admission medications   Medication Sig Start Date End Date Taking? Authorizing Provider  ADVAIR DISKUS 100-50 MCG/DOSE AEPB INHALE ONE DOSE BY MOUTH EVERY 12 HOURS Patient taking differently: Inhale 1 puff by mouth twice daily 04/13/16  Yes Eulas Post, MD  amLODipine (NORVASC) 5 MG tablet Take 5 mg by mouth twice daily 05/19/16  Yes Lucretia Kern, DO  atorvastatin (LIPITOR) 20 MG tablet TAKE ONE TABLET BY MOUTH IN THE EVENING AT 6 PM Patient taking differently: TAKE ONE TABLET BY MOUTH IN THE AFTERNOON 05/30/16  Yes Lucretia Kern, DO  docusate sodium (COLACE) 100 MG capsule Take 1 capsule (100 mg total) by mouth daily as needed for mild constipation or moderate constipation. Patient taking differently: Take 100 mg by mouth every morning.  05/01/16  Yes Clayton Bibles, PA-C  donepezil (ARICEPT) 10 MG tablet TAKE ONE TABLET BY MOUTH AT BEDTIME Patient taking differently: Take 10 mg by mouth at bedtime 03/16/16  Yes Adam R  Jaffe, DO  ELIQUIS 2.5 MG TABS tablet TAKE ONE TABLET BY MOUTH TWICE DAILY 06/15/16  Yes Thayer Headings, MD  furosemide (LASIX) 40 MG tablet TAKE ONE TABLET BY MOUTH ONCE DAILY Patient taking differently: Take 40 mg by mouth once daily 01/28/16  Yes Lucretia Kern, DO  levothyroxine (SYNTHROID, LEVOTHROID) 25 MCG tablet TAKE ONE & ONE-HALF TABLETS BY MOUTH ONCE DAILY IN THE  MORNING BEFORE BREAKFAST Patient taking differently: Take 37.5 mcg by mouth once daily in the morning before breakfast 04/18/16  Yes Lucretia Kern, DO  Melatonin 5 MG CAPS Take 5 mg by mouth at bedtime.    Yes Historical Provider, MD  metoprolol tartrate (LOPRESSOR) 25 MG tablet Take 1 tablet (25 mg total) by mouth 2 (two) times daily. 05/19/16  Yes Lucretia Kern, DO  vitamin C (ASCORBIC ACID) 500 MG tablet Take 500 mg by mouth daily.   Yes Historical Provider, MD    Family History Family History  Problem Relation Age of Onset  . Cancer Mother     lung  . Heart disease Father 31    Social History Social History  Substance Use Topics  . Smoking status: Former Smoker    Packs/day: 0.50    Types: Cigarettes    Quit date: 02/16/2008  . Smokeless tobacco: Never Used  . Alcohol use No     Allergies   Patient has no known allergies.   Review of Systems Review of Systems  Unable to perform ROS: Dementia     Physical Exam Updated Vital Signs BP 137/75   Pulse 82   Temp 97.3 F (36.3 C) (Oral)   Resp 12   SpO2 97%   Physical Exam  Constitutional: She is oriented to person, place, and time. She appears well-developed and well-nourished. No distress.  HENT:  Head: Normocephalic and atraumatic.  Mouth/Throat: Oropharynx is clear and moist.  Eyes: Conjunctivae and EOM are normal.  Pupils constricted but reactive bilaterally  Neck: Normal range of motion.  No midline C-spine  tenderness to palpation or step-offs appreciated. Patient has full range of motion without pain.  Grip strength, biceps, triceps 5/5 bilaterally;  can differentiate between pinprick and light touch bilaterally.   Cardiovascular: Normal rate, regular rhythm and intact distal pulses.   Pulmonary/Chest: Effort normal and breath sounds normal. No respiratory distress. She has no wheezes. She has no rales. She exhibits no tenderness.  Abdominal: Soft. There is no tenderness.  Musculoskeletal: Normal range of  motion. She exhibits tenderness.  Tender over the left hip, no ecchymoses. Cannot lift her leg up off the bed. Left leg is externally rotated and shortened, distally neurovascularly intact with excellent strength and sensation to toes.  Neurological: She is alert and oriented to person, place, and time.  Skin: Capillary refill takes less than 2 seconds. She is not diaphoretic.  Psychiatric: She has a normal mood and affect.  Nursing note and vitals reviewed.    ED Treatments / Results  Labs (all labs ordered are listed, but only abnormal results are displayed) Labs Reviewed  CBC WITH DIFFERENTIAL/PLATELET - Abnormal; Notable for the following:       Result Value   WBC 13.8 (*)    Neutro Abs 10.1 (*)    All other components within normal limits  COMPREHENSIVE METABOLIC PANEL - Abnormal; Notable for the following:    Potassium 3.2 (*)    Glucose, Bld 178 (*)    Creatinine, Ser 1.60 (*)    Calcium 8.7 (*)  Total Protein 6.4 (*)    Albumin 3.3 (*)    GFR calc non Af Amer 27 (*)    GFR calc Af Amer 31 (*)    All other components within normal limits  APTT - Abnormal; Notable for the following:    aPTT 37 (*)    All other components within normal limits  PROTIME-INR  URINALYSIS, ROUTINE W REFLEX MICROSCOPIC (NOT AT [facility])  TYPE AND SCREEN    EKG  EKG Interpretation None       Radiology Ct Head Wo Contrast  Result Date: 06/17/2016 CLINICAL DATA:  Pain after fall. EXAM: CT HEAD WITHOUT CONTRAST CT CERVICAL SPINE WITHOUT CONTRAST TECHNIQUE: Multidetector CT imaging of the head and cervical spine was performed following the standard protocol without intravenous contrast. Multiplanar CT image reconstructions of the cervical spine were also generated. COMPARISON:  None. FINDINGS: CT HEAD FINDINGS Brain: No subdural, epidural, or subarachnoid hemorrhage. Ventricles and sulci are normal for age. Moderate white matter changes are identified. No acute cortical ischemia or infarct. The  cerebellum, brainstem, and basal cisterns are normal. No mass, mass effect, or midline shift. Vascular: Calcified atherosclerosis seen in the intracranial carotid arteries. Skull: Normal. Negative for fracture or focal lesion. Sinuses/Orbits: No acute finding. Other: None. CT CERVICAL SPINE FINDINGS Alignment: Minimal anterolisthesis of C2 versus C3, C3 versus C4, and C4 versus C5. Skull base and vertebrae: No fractures. Soft tissues and spinal canal: No prevertebral fluid or swelling. No visible canal hematoma. Disc levels: Multilevel degenerative changes with anterior osteophytes and facet degenerative changes. Upper chest: Scarring in the left apex. Other: No other abnormalities. IMPRESSION: 1. No acute intracranial process. 2. No fracture or traumatic malalignment in the cervical spine. Anterior listhesis at a few levels in the upper cervical spine is thought to be due to facet degenerative changes. Electronically Signed   By: Dorise Bullion III M.D   On: 06/17/2016 20:41   Ct Cervical Spine Wo Contrast  Result Date: 06/17/2016 CLINICAL DATA:  Pain after fall. EXAM: CT HEAD WITHOUT CONTRAST CT CERVICAL SPINE WITHOUT CONTRAST TECHNIQUE: Multidetector CT imaging of the head and cervical spine was performed following the standard protocol without intravenous contrast. Multiplanar CT image reconstructions of the cervical spine were also generated. COMPARISON:  None. FINDINGS: CT HEAD FINDINGS Brain: No subdural, epidural, or subarachnoid hemorrhage. Ventricles and sulci are normal for age. Moderate white matter changes are identified. No acute cortical ischemia or infarct. The cerebellum, brainstem, and basal cisterns are normal. No mass, mass effect, or midline shift. Vascular: Calcified atherosclerosis seen in the intracranial carotid arteries. Skull: Normal. Negative for fracture or focal lesion. Sinuses/Orbits: No acute finding. Other: None. CT CERVICAL SPINE FINDINGS Alignment: Minimal anterolisthesis of  C2 versus C3, C3 versus C4, and C4 versus C5. Skull base and vertebrae: No fractures. Soft tissues and spinal canal: No prevertebral fluid or swelling. No visible canal hematoma. Disc levels: Multilevel degenerative changes with anterior osteophytes and facet degenerative changes. Upper chest: Scarring in the left apex. Other: No other abnormalities. IMPRESSION: 1. No acute intracranial process. 2. No fracture or traumatic malalignment in the cervical spine. Anterior listhesis at a few levels in the upper cervical spine is thought to be due to facet degenerative changes. Electronically Signed   By: Dorise Bullion III M.D   On: 06/17/2016 20:41   Dg Chest Port 1 View  Result Date: 06/17/2016 CLINICAL DATA:  Preop for hip fracture. EXAM: PORTABLE CHEST 1 VIEW COMPARISON:  04/26/2014 CXR FINDINGS: The heart  is normal in size. There is thoracic aortic atherosclerosis. The lungs are clear. Pulmonary vasculature is unremarkable. No acute osseous abnormality. No effusion or pneumothorax. IMPRESSION: No acute cardiopulmonary disease. Electronically Signed   By: Ashley Royalty M.D.   On: 06/17/2016 21:19   Dg Hip Unilat With Pelvis 2-3 Views Left  Result Date: 06/17/2016 CLINICAL DATA:  Pain after trip and fall. EXAM: DG HIP (WITH OR WITHOUT PELVIS) 2-3V LEFT COMPARISON:  CT from 05/01/2016 FINDINGS: There is an acute, closed, comminuted, post varus angulated intratrochanteric fracture of the left femur with avulsed lesser trochanter. No malalignment of the femoral head from acetabulum. The pubic rami appear intact. Partially visualized aorto bi-iliac stent graft. The bony pelvis appears intact. The contralateral hip is intact. IMPRESSION: Acute, closed, comminuted, varus angulated intratrochanteric fracture of the left femur with avulsed lesser trochanter. Electronically Signed   By: Ashley Royalty M.D.   On: 06/17/2016 20:21    Procedures Procedures (including critical care time)  Medications Ordered in  ED Medications  morphine 4 MG/ML injection 2 mg (not administered)  morphine 4 MG/ML injection 2 mg (2 mg Intravenous Given 06/17/16 2106)     Initial Impression / Assessment and Plan / ED Course  I have reviewed the triage vital signs and the nursing notes.  Pertinent labs & imaging results that were available during my care of the patient were reviewed by me and considered in my medical decision making (see chart for details).  Clinical Course     Vitals:   06/17/16 2025 06/17/16 2030 06/17/16 2130 06/17/16 2200  BP:  152/72 143/59 137/75  Pulse: 75 70 81 82  Resp:   15 12  Temp:      TempSrc:      SpO2: 97% 97% 97% 97%    Medications  morphine 4 MG/ML injection 2 mg (not administered)  morphine 4 MG/ML injection 2 mg (2 mg Intravenous Given 06/17/16 2106)    Caroline Underwood is 82 y.o. female presenting with Mechanical fall while ambulating with a walker she also has had head trauma. Patient takes Eliquis, she had her morning dose this morning. She is mentating at her baseline as per her daughter. The left leg is shortened and externally rotated, neurovascularly intact with a closed injury to the left hip. The x-ray shows an intertrochanteric fracture.  Case discussed with Dr. Ninfa Linden who recommends hospitalist admission, likely surgical correction on Sunday given the half-life of Eliquis.  Patient will be admitted to [facility] hospitalist Dr. Jonnie Finner.  Final Clinical Impressions(s) / ED Diagnoses   Final diagnoses:  Closed fracture of left hip, initial encounter [facility])  Chronic anticoagulation    New Prescriptions Current Discharge Medication List       Monico Blitz, PA-C 06/17/16 2300    Lacretia Leigh, MD 06/17/16 [phone]

## 2016-06-17 NOTE — ED Triage Notes (Signed)
Pt to ED via [facility] after reported falling at home.  Pt has shortening and outward rotation to left leg.  Pt also has hematoma to head and is currently on blood thinners.

## 2016-06-18 ENCOUNTER — Encounter (HOSPITAL_COMMUNITY): Payer: Self-pay

## 2016-06-18 DIAGNOSIS — S72142A Displaced intertrochanteric fracture of left femur, initial encounter for closed fracture: Principal | ICD-10-CM

## 2016-06-18 DIAGNOSIS — S72002A Fracture of unspecified part of neck of left femur, initial encounter for closed fracture: Secondary | ICD-10-CM | POA: Diagnosis present

## 2016-06-18 LAB — SURGICAL PCR SCREEN
MRSA, PCR: NEGATIVE
Staphylococcus aureus: NEGATIVE

## 2016-06-18 LAB — URINALYSIS, ROUTINE W REFLEX MICROSCOPIC
BILIRUBIN URINE: NEGATIVE
GLUCOSE, UA: NEGATIVE mg/dL
HGB URINE DIPSTICK: NEGATIVE
KETONES UR: NEGATIVE mg/dL
Leukocytes, UA: NEGATIVE
NITRITE: NEGATIVE
PH: 6 (ref 5.0–8.0)
Protein, ur: >300 mg/dL — AB
SPECIFIC GRAVITY, URINE: 1.02 (ref 1.005–1.030)

## 2016-06-18 LAB — URINE MICROSCOPIC-ADD ON: RBC / HPF: NONE SEEN RBC/hpf (ref 0–5)

## 2016-06-18 MED ORDER — HYDROCODONE-ACETAMINOPHEN 5-325 MG PO TABS
1.0000 | ORAL_TABLET | Freq: Four times a day (QID) | ORAL | Status: DC | PRN
  Administered 2016-06-18 (×3): 2 via ORAL
  Filled 2016-06-18 (×3): qty 2

## 2016-06-18 MED ORDER — AMLODIPINE BESYLATE 5 MG PO TABS
5.0000 mg | ORAL_TABLET | Freq: Two times a day (BID) | ORAL | Status: DC
Start: 2016-06-18 — End: 2016-06-21
  Administered 2016-06-18 – 2016-06-21 (×7): 5 mg via ORAL
  Filled 2016-06-18 (×7): qty 1

## 2016-06-18 MED ORDER — MORPHINE SULFATE (PF) 2 MG/ML IV SOLN: 1.0000 mg | INTRAVENOUS | Status: DC | PRN

## 2016-06-18 MED ORDER — MOMETASONE FURO-FORMOTEROL FUM 100-5 MCG/ACT IN AERO
2.0000 | INHALATION_SPRAY | Freq: Two times a day (BID) | RESPIRATORY_TRACT | Status: DC
  Administered 2016-06-18 – 2016-06-21 (×6): 2 via RESPIRATORY_TRACT
  Filled 2016-06-18: qty 8.8

## 2016-06-18 MED ORDER — PNEUMOCOCCAL VAC POLYVALENT 25 MCG/0.5ML IJ INJ
0.5000 mL | INJECTION | INTRAMUSCULAR | Status: AC
  Administered 2016-06-21: 0.5 mL via INTRAMUSCULAR
  Filled 2016-06-18: qty 0.5

## 2016-06-18 MED ORDER — POLYETHYLENE GLYCOL 3350 17 G PO PACK: 17.0000 g | PACK | Freq: Every day | ORAL | Status: DC | PRN

## 2016-06-18 MED ORDER — AMLODIPINE BESYLATE 5 MG PO TABS
ORAL_TABLET | ORAL | Status: AC
  Filled 2016-06-18: qty 1

## 2016-06-18 MED ORDER — POTASSIUM CHLORIDE CRYS ER 20 MEQ PO TBCR
40.0000 meq | EXTENDED_RELEASE_TABLET | Freq: Once | ORAL | Status: AC
  Administered 2016-06-18: 40 meq via ORAL
  Filled 2016-06-18: qty 2

## 2016-06-18 MED ORDER — ONDANSETRON HCL 4 MG/2ML IJ SOLN
4.0000 mg | Freq: Once | INTRAMUSCULAR | Status: AC
  Administered 2016-06-18: 4 mg via INTRAVENOUS

## 2016-06-18 MED ORDER — FUROSEMIDE 20 MG PO TABS
20.0000 mg | ORAL_TABLET | Freq: Every day | ORAL | Status: DC
  Administered 2016-06-18: 20 mg via ORAL
  Filled 2016-06-18: qty 1

## 2016-06-18 MED ORDER — CHLORHEXIDINE GLUCONATE 4 % EX LIQD: 60.0000 mL | Freq: Once | CUTANEOUS | Status: DC

## 2016-06-18 MED ORDER — CEFAZOLIN SODIUM-DEXTROSE 2-4 GM/100ML-% IV SOLN
2.0000 g | INTRAVENOUS | Status: AC
  Administered 2016-06-19: 2 g via INTRAVENOUS
  Filled 2016-06-18: qty 100

## 2016-06-18 MED ORDER — DONEPEZIL HCL 10 MG PO TABS
ORAL_TABLET | ORAL | Status: AC
  Filled 2016-06-18: qty 1

## 2016-06-18 MED ORDER — POVIDONE-IODINE 10 % EX SWAB: 2.0000 "application " | Freq: Once | CUTANEOUS | Status: DC

## 2016-06-18 MED ORDER — METOPROLOL TARTRATE 25 MG PO TABS
25.0000 mg | ORAL_TABLET | Freq: Two times a day (BID) | ORAL | Status: DC
Start: 2016-06-18 — End: 2016-06-21
  Administered 2016-06-18 – 2016-06-21 (×7): 25 mg via ORAL
  Filled 2016-06-18 (×6): qty 1

## 2016-06-18 MED ORDER — DOCUSATE SODIUM 100 MG PO CAPS
100.0000 mg | ORAL_CAPSULE | Freq: Every day | ORAL | Status: DC
  Administered 2016-06-18 – 2016-06-20 (×2): 100 mg via ORAL
  Filled 2016-06-18 (×2): qty 1

## 2016-06-18 MED ORDER — LEVOTHYROXINE SODIUM 75 MCG PO TABS
37.5000 ug | ORAL_TABLET | Freq: Every day | ORAL | Status: DC
  Administered 2016-06-18 – 2016-06-21 (×3): 37.5 ug via ORAL
  Filled 2016-06-18 (×3): qty 1

## 2016-06-18 MED ORDER — VITAMIN C 500 MG PO TABS
500.0000 mg | ORAL_TABLET | Freq: Every day | ORAL | Status: DC
  Administered 2016-06-18 – 2016-06-21 (×3): 500 mg via ORAL
  Filled 2016-06-18 (×3): qty 1

## 2016-06-18 MED ORDER — ONDANSETRON HCL 4 MG/2ML IJ SOLN
INTRAMUSCULAR | Status: AC
  Filled 2016-06-18: qty 2

## 2016-06-18 MED ORDER — DONEPEZIL HCL 10 MG PO TABS
10.0000 mg | ORAL_TABLET | Freq: Every day | ORAL | Status: DC
Start: 2016-06-18 — End: 2016-06-21
  Administered 2016-06-18 – 2016-06-20 (×4): 10 mg via ORAL
  Filled 2016-06-18 (×3): qty 1

## 2016-06-18 MED ORDER — ATORVASTATIN CALCIUM 20 MG PO TABS
20.0000 mg | ORAL_TABLET | Freq: Every day | ORAL | Status: DC
Start: 2016-06-18 — End: 2016-06-21
  Administered 2016-06-18 – 2016-06-20 (×3): 20 mg via ORAL
  Filled 2016-06-18 (×3): qty 1

## 2016-06-18 MED ORDER — FLEET ENEMA 7-19 GM/118ML RE ENEM: 1.0000 | ENEMA | Freq: Once | RECTAL | Status: DC | PRN

## 2016-06-18 MED ORDER — METOPROLOL TARTRATE 25 MG PO TABS
ORAL_TABLET | ORAL | Status: AC
  Filled 2016-06-18: qty 1

## 2016-06-18 NOTE — Clinical Social Work Placement (Signed)
   CLINICAL SOCIAL WORK PLACEMENT  NOTE  Date:  06/18/2016  Patient Details  Name: Macaiah Walsh MRN: [id number] Date of Birth: 1922-03-25  Clinical Social Work is seeking post-discharge placement for this patient at the [place] level of care (*CSW will initial, date and re-position this form in  chart as items are completed):      Patient/family provided with [place] Work Department's list of facilities offering this level of care within the geographic area requested by the patient (or if unable, by the patient's family).      Patient/family informed of their freedom to choose among providers that offer the needed level of care, that participate in Medicare, Medicaid or managed care program needed by the patient, have an available bed and are willing to accept the patient.      Patient/family informed of [place] [facility]'s ownership interest in [facility] and [facility], as well as of the fact that they are under no obligation to receive care at these facilities.  PASRR submitted to EDS on       PASRR number received on       Existing PASRR number confirmed on 06/18/16     FL2 transmitted to all facilities in geographic area requested by pt/family on 06/18/16     FL2 transmitted to all facilities within larger geographic area on       Patient informed that his/her managed care company has contracts with or will negotiate with certain facilities, including the following:            Patient/family informed of bed offers received.  Patient chooses bed at       Physician recommends and patient chooses bed at      Patient to be transferred to   on  .  Patient to be transferred to facility by       Patient family notified on   of transfer.  Name of family member notified:        PHYSICIAN Please sign DNR, Please prepare priority discharge summary, including medications, Please prepare prescriptions, Please sign FL2     Additional  Comment:    _______________________________________________ Alla German, LCSW 06/18/2016, 3:59 PM

## 2016-06-18 NOTE — Consult Note (Signed)
Reason for Consult:  Left hip fracture Referring Physician: EDP  Caroline Underwood is an 82 y.o. female.  HPI:   82 yo female with dementia who sustained a mechanical fall last evening.  Was brought to the [facility] and found to have a left hip fracture.  She lives with her daughter and does ambulate.  She reports significant left hip pain.  Ortho was consulted to evaluate and treat her left hip fracture and [facility] was consulted for medical admission and management.  The patient is on Eliquis and did get her medication yesterday.  Past Medical History:  Diagnosis Date  . Breast cancer ([place]) 01/02/2013  . Chronic kidney disease 01/02/2013   stage 3/4  . COPD (chronic obstructive pulmonary disease) ([place]) 01/02/2013  . Dementia, on Namenda briefly in the past per review of records 01/02/2013  . Essential hypertension, benign 01/02/2013  . GERD (gastroesophageal reflux disease) 01/02/2013  . Hx of atrial flutter, on Xarelto 01/02/2013  . Hyperlipemia 01/02/2013  . Iron deficiency anemia 01/02/2013  . Osteoporosis, unspecified 01/02/2013  . PAD (peripheral artery disease) ([place]) 01/02/2013  . Status post AAA (abdominal aortic aneurysm) repair 01/02/2013  . Unspecified hypothyroidism 01/02/2013    Past Surgical History:  Procedure Laterality Date  . ABDOMINAL AORTIC ANEURYSM REPAIR    . BREAST SURGERY     right lumpectomy    Family History  Problem Relation Age of Onset  . Cancer Mother     lung  . Heart disease Father 53    Social History:  reports that she quit smoking about 8 years ago. Her smoking use included Cigarettes. She smoked 0.50 packs per day. She has never used smokeless tobacco. She reports that she does not drink alcohol or use drugs.  Allergies: No Known Allergies  Medications: I have reviewed the patient's current medications.  Results for orders placed or performed during the hospital encounter of 06/17/16 (from the past 48 hour(s))  Type and screen [place]     Status: None   Collection Time: 06/17/16  7:30 PM  Result Value Ref Range   ABO/RH(D) O POS    Antibody Screen NEG    Sample Expiration 06/20/2016   CBC with Differential     Status: Abnormal   Collection Time: 06/17/16  7:35 PM  Result Value Ref Range   WBC 13.8 (H) 4.0 - 10.5 K/uL   RBC 4.15 3.87 - 5.11 MIL/uL   Hemoglobin 12.9 12.0 - 15.0 g/dL   HCT 37.8 36.0 - 46.0 %   MCV 91.1 78.0 - 100.0 fL   MCH 31.1 26.0 - 34.0 pg   MCHC 34.1 30.0 - 36.0 g/dL   RDW 12.1 11.5 - 15.5 %   Platelets 218 150 - 400 K/uL   Neutrophils Relative % 74 %   Neutro Abs 10.1 (H) 1.7 - 7.7 K/uL   Lymphocytes Relative 18 %   Lymphs Abs 2.5 0.7 - 4.0 K/uL   Monocytes Relative 6 %   Monocytes Absolute 0.9 0.1 - 1.0 K/uL   Eosinophils Relative 2 %   Eosinophils Absolute 0.3 0.0 - 0.7 K/uL   Basophils Relative 0 %   Basophils Absolute 0.1 0.0 - 0.1 K/uL  Comprehensive metabolic panel     Status: Abnormal   Collection Time: 06/17/16  7:35 PM  Result Value Ref Range   Sodium 139 135 - 145 mmol/L   Potassium 3.2 (L) 3.5 - 5.1 mmol/L   Chloride 103 101 -  111 mmol/L   CO2 25 22 - 32 mmol/L   Glucose, Bld 178 (H) 65 - 99 mg/dL   BUN 15 6 - 20 mg/dL   Creatinine, Ser 1.60 (H) 0.44 - 1.00 mg/dL   Calcium 8.7 (L) 8.9 - 10.3 mg/dL   Total Protein 6.4 (L) 6.5 - 8.1 g/dL   Albumin 3.3 (L) 3.5 - 5.0 g/dL   AST 21 15 - 41 U/L   ALT 14 14 - 54 U/L   Alkaline Phosphatase 102 38 - 126 U/L   Total Bilirubin 0.4 0.3 - 1.2 mg/dL   GFR calc non Af Amer 27 (L) >60 mL/min   GFR calc Af Amer 31 (L) >60 mL/min    Comment: (NOTE) The eGFR has been calculated using the CKD EPI equation. This calculation has not been validated in all clinical situations. eGFR's persistently <60 mL/min signify possible Chronic Kidney Disease.    Anion gap 11 5 - 15  Protime-INR     Status: None   Collection Time: 06/17/16  7:35 PM  Result Value Ref Range   Prothrombin Time 14.1 11.4 - 15.2 seconds   INR  1.09   APTT     Status: Abnormal   Collection Time: 06/17/16  7:35 PM  Result Value Ref Range   aPTT 37 (H) 24 - 36 seconds    Comment:        IF BASELINE aPTT IS ELEVATED, SUGGEST PATIENT RISK ASSESSMENT BE USED TO DETERMINE APPROPRIATE ANTICOAGULANT THERAPY.     Ct Head Wo Contrast  Result Date: 06/17/2016 CLINICAL DATA:  Pain after fall. EXAM: CT HEAD WITHOUT CONTRAST CT CERVICAL SPINE WITHOUT CONTRAST TECHNIQUE: Multidetector CT imaging of the head and cervical spine was performed following the standard protocol without intravenous contrast. Multiplanar CT image reconstructions of the cervical spine were also generated. COMPARISON:  None. FINDINGS: CT HEAD FINDINGS Brain: No subdural, epidural, or subarachnoid hemorrhage. Ventricles and sulci are normal for age. Moderate white matter changes are identified. No acute cortical ischemia or infarct. The cerebellum, brainstem, and basal cisterns are normal. No mass, mass effect, or midline shift. Vascular: Calcified atherosclerosis seen in the intracranial carotid arteries. Skull: Normal. Negative for fracture or focal lesion. Sinuses/Orbits: No acute finding. Other: None. CT CERVICAL SPINE FINDINGS Alignment: Minimal anterolisthesis of C2 versus C3, C3 versus C4, and C4 versus C5. Skull base and vertebrae: No fractures. Soft tissues and spinal canal: No prevertebral fluid or swelling. No visible canal hematoma. Disc levels: Multilevel degenerative changes with anterior osteophytes and facet degenerative changes. Upper chest: Scarring in the left apex. Other: No other abnormalities. IMPRESSION: 1. No acute intracranial process. 2. No fracture or traumatic malalignment in the cervical spine. Anterior listhesis at a few levels in the upper cervical spine is thought to be due to facet degenerative changes. Electronically Signed   By: Dorise Bullion III M.D   On: 06/17/2016 20:41   Ct Cervical Spine Wo Contrast  Result Date: 06/17/2016 CLINICAL DATA:   Pain after fall. EXAM: CT HEAD WITHOUT CONTRAST CT CERVICAL SPINE WITHOUT CONTRAST TECHNIQUE: Multidetector CT imaging of the head and cervical spine was performed following the standard protocol without intravenous contrast. Multiplanar CT image reconstructions of the cervical spine were also generated. COMPARISON:  None. FINDINGS: CT HEAD FINDINGS Brain: No subdural, epidural, or subarachnoid hemorrhage. Ventricles and sulci are normal for age. Moderate white matter changes are identified. No acute cortical ischemia or infarct. The cerebellum, brainstem, and basal cisterns are normal. No mass, mass  effect, or midline shift. Vascular: Calcified atherosclerosis seen in the intracranial carotid arteries. Skull: Normal. Negative for fracture or focal lesion. Sinuses/Orbits: No acute finding. Other: None. CT CERVICAL SPINE FINDINGS Alignment: Minimal anterolisthesis of C2 versus C3, C3 versus C4, and C4 versus C5. Skull base and vertebrae: No fractures. Soft tissues and spinal canal: No prevertebral fluid or swelling. No visible canal hematoma. Disc levels: Multilevel degenerative changes with anterior osteophytes and facet degenerative changes. Upper chest: Scarring in the left apex. Other: No other abnormalities. IMPRESSION: 1. No acute intracranial process. 2. No fracture or traumatic malalignment in the cervical spine. Anterior listhesis at a few levels in the upper cervical spine is thought to be due to facet degenerative changes. Electronically Signed   By: Dorise Bullion III M.D   On: 06/17/2016 20:41   Dg Chest Port 1 View  Result Date: 06/17/2016 CLINICAL DATA:  Preop for hip fracture. EXAM: PORTABLE CHEST 1 VIEW COMPARISON:  04/26/2014 CXR FINDINGS: The heart is normal in size. There is thoracic aortic atherosclerosis. The lungs are clear. Pulmonary vasculature is unremarkable. No acute osseous abnormality. No effusion or pneumothorax. IMPRESSION: No acute cardiopulmonary disease. Electronically Signed    By: Ashley Royalty M.D.   On: 06/17/2016 21:19   Dg Hip Unilat With Pelvis 2-3 Views Left  Result Date: 06/17/2016 CLINICAL DATA:  Pain after trip and fall. EXAM: DG HIP (WITH OR WITHOUT PELVIS) 2-3V LEFT COMPARISON:  CT from 05/01/2016 FINDINGS: There is an acute, closed, comminuted, post varus angulated intratrochanteric fracture of the left femur with avulsed lesser trochanter. No malalignment of the femoral head from acetabulum. The pubic rami appear intact. Partially visualized aorto bi-iliac stent graft. The bony pelvis appears intact. The contralateral hip is intact. IMPRESSION: Acute, closed, comminuted, varus angulated intratrochanteric fracture of the left femur with avulsed lesser trochanter. Electronically Signed   By: Ashley Royalty M.D.   On: 06/17/2016 20:21    ROS Blood pressure (!) 152/63, pulse 86, temperature 98.2 F (36.8 C), temperature source Oral, resp. rate 19, height _0  (1.626 m), weight 146 lb 9.7 oz (66.5 kg), SpO2 98 %. Physical Exam  Constitutional: She appears well-developed and well-nourished.  HENT:  Head: Normocephalic and atraumatic.  Eyes: Pupils are equal, round, and reactive to light.  Cardiovascular: Normal rate.   Respiratory: Effort normal.  GI: Soft.  Musculoskeletal:       Left hip: She exhibits decreased range of motion, decreased strength, tenderness and bony tenderness.  Neurological: She is alert.  Skin: Skin is warm.  Psychiatric: She has a normal mood and affect.    Assessment/Plan: Displaced left hip intertrochanteric fracture 1)  I spoke with the patient at the bedside and her daughter on the phone. Have recommended surgical repair of her broken left hip.  Will plan on surgery tomorrow am 12/3 given the fact she has been on Eliquis.  Bedrest today.  A long discussion of the risks and benefits of surgery was had.  Certainly moderate to high risk given her age, but she does ambulate and is experiencing significant pain.  The goals will be  increased mobility, decreased paini, and improving quality of life.  Mcarthur Rossetti 06/18/2016, 8:05 AM

## 2016-06-18 NOTE — Progress Notes (Signed)
Received patient from ED. Patient AOx2, VS stable, O2Sat at 98% on RA, pain at 2/10 at left hip, applied cold therapy.  Patient oriented to bedroom, bed controls and call light.  Patient resting on bed comfortably. Will monitor.

## 2016-06-18 NOTE — ED Notes (Signed)
Prior to transport upstairs pt complained of some mild nausea. Elmyra Ricks PA notified and gave verbal order for zofran. Administered by this RN

## 2016-06-18 NOTE — Progress Notes (Signed)
PROGRESS NOTE  Caroline Underwood [id number] DOB: 23-Jan-1922 DOA: 06/17/2016 PCP: Lucretia Kern., DO  HPI/Recap of past 24 hours:  Laying in bed, no acute complaints, oriented to person only, calm and pleasant  Assessment/Plan: Principal Problem:   Closed fracture of femur, intertrochanteric, left, initial encounter [facility]) Active Problems:   Essential hypertension, benign   Atrial fibrillation ([place])   Chronic kidney disease   COPD (chronic obstructive pulmonary disease) ([place])   Closed left hip fracture ([place])   1. Mechanical fall/ Displaced left hip intertrochanteric fracture - per ortho, plan OR Sunday, eliquis held 2. Hypokalemia; replace k, check mag 3. H/o paroxysmal afib/Aflutter - sinus rhythm on lopressor.  holding Eliquis, consult pharm 4. Copd , h/o pneumothorax. Stable, no sob, no wheezing 5. Hypothyroidism: on synthroid 6. Dementia -  on aricept,  Prior to this hospitalization, she walks w walker, oriented to person only at baseline, watch for delirium , daughter states it happened during prior hospitalization 7. Hist breast cancer 8. CKD stage 3/4, cr at baseline 9. Hx AAA repair 10. HTN - cont norvasc, lopressor, hold lasix, no edema, lung clear, patient does not have appetite  11. Body mass index is 25.16 kg/m.   Code Status: DNR  Family Communication: patient and daughter in room  Disposition Plan: pending, likely will need SNF   Consultants:  orthopedics  Procedures:  Left hip on 12/3   Antibiotics:  perioperative   Objective: BP (!) 152/63 (BP Location: Left Arm)   Pulse 86   Temp 98.2 F (36.8 C) (Oral)   Resp 19   Ht 5\' 4" (1.626 m)   Wt 66.5 kg (146 lb 9.7 oz)   SpO2 93%   BMI 25.16 kg/m   Intake/Output Summary (Last 24 hours) at 06/18/16 0901 Last data filed at 06/18/16 0458  Gross per 24 hour  Intake              240 ml  Output              120 ml  Net              12 0 ml   Filed Weights   06/18/16 0050  Weight: 66.5  kg (146 lb 9.7 oz)    Exam:   General:  NAD, calm and pleasant, seems younger than stated age  Cardiovascular: RRR  Respiratory: CTABL  Abdomen: Soft/ND/NT, positive BS  Musculoskeletal: tenderness left hip, left leg shortened and externally rotated   Neuro: alert, oriented to person only, memory impaired  Data Reviewed: Basic Metabolic Panel:  Recent Labs Lab 06/17/16 1935  NA 139  K 3.2*  CL 103  CO2 25  GLUCOSE 178*  BUN 15  CREATININE 1.60*  CALCIUM 8.7*   Liver Function Tests:  Recent Labs Lab 06/17/16 1935  AST 21  ALT 14  ALKPHOS 102  BILITOT 0.4  PROT 6.4*  ALBUMIN 3.3*   No results for input(s): LIPASE, AMYLASE in the last 168 hours. No results for input(s): AMMONIA in the last 168 hours. CBC:  Recent Labs Lab 06/17/16 1935  WBC 13.8*  NEUTROABS 10.1*  HGB 12.9  HCT 37.8  MCV 91.1  PLT 218   Cardiac Enzymes:   No results for input(s): CKTOTAL, CKMB, CKMBINDEX, TROPONINI in the last 168 hours. BNP (last 3 results) No results for input(s): BNP in the last 8760 hours.  ProBNP (last 3 results) No results for input(s): PROBNP in the last 8760 hours.  CBG: No results for  input(s): GLUCAP in the last 168 hours.  No results found for this or any previous visit (from the past 240 hour(s)).   Studies: Ct Head Wo Contrast  Result Date: 06/17/2016 CLINICAL DATA:  Pain after fall. EXAM: CT HEAD WITHOUT CONTRAST CT CERVICAL SPINE WITHOUT CONTRAST TECHNIQUE: Multidetector CT imaging of the head and cervical spine was performed following the standard protocol without intravenous contrast. Multiplanar CT image reconstructions of the cervical spine were also generated. COMPARISON:  None. FINDINGS: CT HEAD FINDINGS Brain: No subdural, epidural, or subarachnoid hemorrhage. Ventricles and sulci are normal for age. Moderate white matter changes are identified. No acute cortical ischemia or infarct. The cerebellum, brainstem, and basal cisterns are normal.  No mass, mass effect, or midline shift. Vascular: Calcified atherosclerosis seen in the intracranial carotid arteries. Skull: Normal. Negative for fracture or focal lesion. Sinuses/Orbits: No acute finding. Other: None. CT CERVICAL SPINE FINDINGS Alignment: Minimal anterolisthesis of C2 versus C3, C3 versus C4, and C4 versus C5. Skull base and vertebrae: No fractures. Soft tissues and spinal canal: No prevertebral fluid or swelling. No visible canal hematoma. Disc levels: Multilevel degenerative changes with anterior osteophytes and facet degenerative changes. Upper chest: Scarring in the left apex. Other: No other abnormalities. IMPRESSION: 1. No acute intracranial process. 2. No fracture or traumatic malalignment in the cervical spine. Anterior listhesis at a few levels in the upper cervical spine is thought to be due to facet degenerative changes. Electronically Signed   By: Dorise Bullion III M.D   On: 06/17/2016 20:41   Ct Cervical Spine Wo Contrast  Result Date: 06/17/2016 CLINICAL DATA:  Pain after fall. EXAM: CT HEAD WITHOUT CONTRAST CT CERVICAL SPINE WITHOUT CONTRAST TECHNIQUE: Multidetector CT imaging of the head and cervical spine was performed following the standard protocol without intravenous contrast. Multiplanar CT image reconstructions of the cervical spine were also generated. COMPARISON:  None. FINDINGS: CT HEAD FINDINGS Brain: No subdural, epidural, or subarachnoid hemorrhage. Ventricles and sulci are normal for age. Moderate white matter changes are identified. No acute cortical ischemia or infarct. The cerebellum, brainstem, and basal cisterns are normal. No mass, mass effect, or midline shift. Vascular: Calcified atherosclerosis seen in the intracranial carotid arteries. Skull: Normal. Negative for fracture or focal lesion. Sinuses/Orbits: No acute finding. Other: None. CT CERVICAL SPINE FINDINGS Alignment: Minimal anterolisthesis of C2 versus C3, C3 versus C4, and C4 versus C5. Skull  base and vertebrae: No fractures. Soft tissues and spinal canal: No prevertebral fluid or swelling. No visible canal hematoma. Disc levels: Multilevel degenerative changes with anterior osteophytes and facet degenerative changes. Upper chest: Scarring in the left apex. Other: No other abnormalities. IMPRESSION: 1. No acute intracranial process. 2. No fracture or traumatic malalignment in the cervical spine. Anterior listhesis at a few levels in the upper cervical spine is thought to be due to facet degenerative changes. Electronically Signed   By: Dorise Bullion III M.D   On: 06/17/2016 20:41   Dg Chest Port 1 View  Result Date: 06/17/2016 CLINICAL DATA:  Preop for hip fracture. EXAM: PORTABLE CHEST 1 VIEW COMPARISON:  04/26/2014 CXR FINDINGS: The heart is normal in size. There is thoracic aortic atherosclerosis. The lungs are clear. Pulmonary vasculature is unremarkable. No acute osseous abnormality. No effusion or pneumothorax. IMPRESSION: No acute cardiopulmonary disease. Electronically Signed   By: Ashley Royalty M.D.   On: 06/17/2016 21:19   Dg Hip Unilat With Pelvis 2-3 Views Left  Result Date: 06/17/2016 CLINICAL DATA:  Pain after trip and fall.  EXAM: DG HIP (WITH OR WITHOUT PELVIS) 2-3V LEFT COMPARISON:  CT from 05/01/2016 FINDINGS: There is an acute, closed, comminuted, post varus angulated intratrochanteric fracture of the left femur with avulsed lesser trochanter. No malalignment of the femoral head from acetabulum. The pubic rami appear intact. Partially visualized aorto bi-iliac stent graft. The bony pelvis appears intact. The contralateral hip is intact. IMPRESSION: Acute, closed, comminuted, varus angulated intratrochanteric fracture of the left femur with avulsed lesser trochanter. Electronically Signed   By: Ashley Royalty M.D.   On: 06/17/2016 20:21    Scheduled Meds: . amLODipine      . amLODipine  5 mg Oral BID  . atorvastatin  20 mg Oral q1800  . docusate sodium  100 mg Oral Daily  .  donepezil      . donepezil  10 mg Oral QHS  . furosemide  20 mg Oral Daily  . levothyroxine  37.5 mcg Oral QAC breakfast  . metoprolol tartrate      . metoprolol tartrate  25 mg Oral BID  . mometasone-formoterol  2 puff Inhalation BID  . ondansetron      . vitamin C  500 mg Oral Daily    Continuous Infusions:   Time spent: 25 mins  Allex Lapoint MD, PhD  [facility] Pager [phone]. If 7PM-7AM, please contact night-coverage at [email or web address], password [facility] 06/18/2016, 9:01 AM  LOS: 1 day

## 2016-06-18 NOTE — NC FL2 (Signed)
[place] MEDICAID FL2 LEVEL OF CARE SCREENING TOOL     IDENTIFICATION  Patient Name: Caroline Underwood Birthdate: [birth year] Sex: female Admission Date (Current Location): 06/17/2016  [facility] and [place] Number:  Herbalist and Address:  [facility] [place] [address], [place], [place] [address]      Provider Number: [id number]  Attending Physician Name and Address:  Florencia Reasons, MD  Relative Name and Phone Number:       Current Level of Care: Hospital Recommended Level of Care: [place] Prior Approval Number:    Date Approved/Denied: 04/25/14 PASRR Number: [id number]  Discharge Plan: SNF    Current Diagnoses: Patient Active Problem List   Diagnosis Date Noted  . Closed left hip fracture ([place]) 06/18/2016  . Closed fracture of femur, intertrochanteric, left, initial encounter ([place]) 06/17/2016  . Alzheimer's disease 01/23/2013  . Breast cancer ([place]) 01/02/2013  . Essential hypertension, benign 01/02/2013  . Atrial fibrillation ([place]) 01/02/2013  . Hyperlipemia 01/02/2013  . Osteoporosis 01/02/2013  . PAD (peripheral artery disease) ([place]) 01/02/2013  . Chronic kidney disease 01/02/2013  . Hypothyroidism 01/02/2013  . GERD (gastroesophageal reflux disease) 01/02/2013  . COPD (chronic obstructive pulmonary disease) ([place]) 01/02/2013    Orientation RESPIRATION BLADDER Height & Weight     Self  Normal Indwelling catheter (Type: Straight-tip, tube size 14. Balloon size 10 mL.) Weight: 146 lb 9.7 oz (66.5 kg) Height:  5\' 4"  (162.6 cm)  BEHAVIORAL SYMPTOMS/MOOD NEUROLOGICAL BOWEL NUTRITION STATUS      Continent Diet (Heart healthy, thin liquids *Subject to change please check discharge summary)  AMBULATORY STATUS COMMUNICATION OF NEEDS Skin   Extensive Assist Verbally Normal                       Personal Care Assistance Level of Assistance  Bathing, Feeding, Dressing Bathing Assistance: Maximum assistance Feeding  assistance: Independent Dressing Assistance: Maximum assistance     Functional Limitations Info  Sight, Hearing, Speech Sight Info: Adequate Hearing Info: Adequate Speech Info: Adequate    SPECIAL CARE FACTORS FREQUENCY  PT (By licensed PT), OT (By licensed OT)     PT Frequency: min 3x week OT Frequency:  min 3x week            Contractures Contractures Info: Not present    Additional Factors Info  Code Status, Allergies Code Status Info: DNR Allergies Info: No known allergies           Current Medications (06/18/2016):  This is the current hospital active medication list Current Facility-Administered Medications  Medication Dose Route Frequency Provider Last Rate Last Dose  . amLODipine (NORVASC) tablet 5 mg  5 mg Oral BID Roney Jaffe, MD   5 mg at 06/18/16 1041  . atorvastatin (LIPITOR) tablet 20 mg  20 mg Oral q1800 Roney Jaffe, MD      . docusate sodium (COLACE) capsule 100 mg  100 mg Oral Daily Roney Jaffe, MD   100 mg at 06/18/16 1039  . donepezil (ARICEPT) tablet 10 mg  10 mg Oral QHS Roney Jaffe, MD   10 mg at 06/18/16 0125  . furosemide (LASIX) tablet 20 mg  20 mg Oral Daily Roney Jaffe, MD   20 mg at 06/18/16 1040  . HYDROcodone-acetaminophen (NORCO/VICODIN) 5-325 MG per tablet 1-2 tablet  1-2 tablet Oral Q6H PRN Roney Jaffe, MD   2 tablet at 06/18/16 1053  . levothyroxine (SYNTHROID, LEVOTHROID) tablet 37.5 mcg  37.5 mcg Oral  QAC breakfast Roney Jaffe, MD   37.5 mcg at 06/18/16 0826  . metoprolol tartrate (LOPRESSOR) tablet 25 mg  25 mg Oral BID Roney Jaffe, MD   25 mg at 06/18/16 1040  . mometasone-formoterol (DULERA) 100-5 MCG/ACT inhaler 2 puff  2 puff Inhalation BID Roney Jaffe, MD   2 puff at 06/18/16 [phone]  . morphine 2 MG/ML injection 1-2 mg  1-2 mg Intravenous Q3H PRN Roney Jaffe, MD      . Derrill Memo ON 06/19/2016] pneumococcal 23 valent vaccine (PNU-IMMUNE) injection 0.5 mL  0.5 mL Intramuscular Tomorrow-1000 Florencia Reasons, MD      .  polyethylene glycol (MIRALAX / GLYCOLAX) packet 17 g  17 g Oral Daily PRN Roney Jaffe, MD      . sodium phosphate (FLEET) 7-19 GM/118ML enema 1 enema  1 enema Rectal Once PRN Roney Jaffe, MD      . vitamin C (ASCORBIC ACID) tablet 500 mg  500 mg Oral Daily Roney Jaffe, MD   500 mg at 06/18/16 1040     Discharge Medications: Please see discharge summary for a list of discharge medications.  Relevant Imaging Results:  Relevant Lab Results:   Additional Information SSN: [id number]  Alla German, LCSW

## 2016-06-18 NOTE — Clinical Social Work Note (Signed)
Clinical Social Work Assessment  Patient Details  Name: Caroline Underwood MRN: [id number] Date of Birth: [birth year]  Date of referral:  06/18/16               Reason for consult:  Facility Placement                Permission sought to share information with:  Family Supports Permission granted to share information::  Yes, Verbal Permission Granted  Name::     [name]  Agency::     Relationship::  daughter  Contact Information:  [phone]  Housing/Transportation Living arrangements for the past 2 months:  Single Family Home Source of Information:  Adult Children Patient Interpreter Needed:  None Criminal Activity/Legal Involvement Pertinent to Current Situation/Hospitalization:  No - Comment as needed Significant Relationships:  Adult Children Lives with:  Adult Children Do you feel safe going back to the place where you live?  Yes Need for family participation in patient care:  Yes (Comment)  Care giving concerns:  Pt's daughter, son, and friend at bedside. Pt has dementia and pt is disoriented.    Social Worker assessment / plan:  Pt is disoriented. CSW spoke with daughter and son at bedside. Pt lives in her daughters home with her daughter. Per pt's daughter up until this point she has been able to take care of her mom. The daughters concern is her mom must be able to walk in order for her to return to her home after SNF. Pt's daughter agreeable to send her mom to SNF at d/c. Pt has been to [facility] in the past. Pt's daughter reports pt was in their locked unit because of her Dementia. Pt's daughter requesting CSW reach out to [facility] for the pt to go back there. Pt's daughter reports she will see how her mom is following rehab and if she is not able to care for her mom she will look into long term care options. CSW will reach out to [facility] and follow up with daughter.  Employment status:  Retired Nurse, adult PT Recommendations:  [place] / Referral to community resources:  [place]  Patient/Family's Response to care:  Pt's daughter verbalized understanding of CSW role and expressed appreciation for support. Pt's daughter denies any concern regarding pt care.  Patient/Family's Understanding of and Emotional Response to Diagnosis, Current Treatment, and Prognosis:  Pt's daughter is realistic concerning pt's physical limitations. Pt's daughter understating of pt's treatment plan and denies any questions or concerns at this time.  Emotional Assessment Appearance:  Appears stated age Attitude/Demeanor/Rapport:   (Patient was appropriate.) Affect (typically observed):  Accepting, Appropriate Orientation:  Oriented to Self Alcohol / Substance use:  Not Applicable Psych involvement (Current and /or in the community):  No (Comment)  Discharge Needs  Concerns to be addressed:  Care Coordination Readmission within the last 30 days:  No Current discharge risk:  Dependent with Mobility Barriers to Discharge:  Continued Medical Work up   [employer], LCSW 06/18/2016, 3:47 PM

## 2016-06-18 NOTE — Progress Notes (Signed)
ANTICOAGULATION CONSULT NOTE - Initial Consult  Pharmacy Consult for Eliquis Indication: atrial fibrillation  No Known Allergies  Patient Measurements: Height: (P) 5\' 4"  (162.6 cm) Weight: (P) 146 lb 9.7 oz (66.5 kg) IBW/kg (Calculated) : (P) 54.7  Vital Signs: Temp: (P) 97.2 F (36.2 C) (12/02 0050) Temp Source: (P) Oral (12/02 0050) BP: (P) 159/60 (12/02 0050) Pulse Rate: (P) 91 (12/02 0050)  Labs:  Recent Labs  06/17/16 1935  HGB 12.9  HCT 37.8  PLT 218  APTT 37*  LABPROT 14.1  INR 1.09  CREATININE 1.60*    Estimated Creatinine Clearance: 19.4 mL/min (by C-G formula based on SCr of 1.6 mg/dL (H)).   Medical History: Past Medical History:  Diagnosis Date  . Breast cancer ([place]) 01/02/2013  . Chronic kidney disease 01/02/2013   stage 3/4  . COPD (chronic obstructive pulmonary disease) ([place]) 01/02/2013  . Dementia, on Namenda briefly in the past per review of records 01/02/2013  . Essential hypertension, benign 01/02/2013  . GERD (gastroesophageal reflux disease) 01/02/2013  . Hx of atrial flutter, on Xarelto 01/02/2013  . Hyperlipemia 01/02/2013  . Iron deficiency anemia 01/02/2013  . Osteoporosis, unspecified 01/02/2013  . PAD (peripheral artery disease) ([place]) 01/02/2013  . Status post AAA (abdominal aortic aneurysm) repair 01/02/2013  . Unspecified hypothyroidism 01/02/2013    Medications:  Prescriptions Prior to Admission  Medication Sig Dispense Refill Last Dose  . ADVAIR DISKUS 100-50 MCG/DOSE AEPB INHALE ONE DOSE BY MOUTH EVERY 12 HOURS (Patient taking differently: Inhale 1 puff by mouth twice daily) 60 each 2 06/17/2016 at am  . amLODipine (NORVASC) 5 MG tablet Take 5 mg by mouth twice daily 180 tablet 3 06/17/2016 at 0800  . atorvastatin (LIPITOR) 20 MG tablet TAKE ONE TABLET BY MOUTH IN THE EVENING AT 6 PM (Patient taking differently: TAKE ONE TABLET BY MOUTH IN THE AFTERNOON) 90 tablet 0 06/17/2016 at Unknown time  . docusate sodium (COLACE) 100 MG capsule Take  1 capsule (100 mg total) by mouth daily as needed for mild constipation or moderate constipation. (Patient taking differently: Take 100 mg by mouth every morning. ) 30 capsule 0 06/17/2016 at Unknown time  . donepezil (ARICEPT) 10 MG tablet TAKE ONE TABLET BY MOUTH AT BEDTIME (Patient taking differently: Take 10 mg by mouth at bedtime) 90 tablet 1 06/16/2016 at Unknown time  . ELIQUIS 2.5 MG TABS tablet TAKE ONE TABLET BY MOUTH TWICE DAILY 180 tablet 2 06/17/2016 at 0800  . furosemide (LASIX) 40 MG tablet TAKE ONE TABLET BY MOUTH ONCE DAILY (Patient taking differently: Take 40 mg by mouth once daily) 90 tablet 1 06/17/2016 at Unknown time  . levothyroxine (SYNTHROID, LEVOTHROID) 25 MCG tablet TAKE ONE & ONE-HALF TABLETS BY MOUTH ONCE DAILY IN THE MORNING BEFORE BREAKFAST (Patient taking differently: Take 37.5 mcg by mouth once daily in the morning before breakfast) 135 tablet 1 06/17/2016 at Unknown time  . Melatonin 5 MG CAPS Take 5 mg by mouth at bedtime.    06/16/2016 at Unknown time  . metoprolol tartrate (LOPRESSOR) 25 MG tablet Take 1 tablet (25 mg total) by mouth 2 (two) times daily. 180 tablet 3 06/17/2016 at 0800  . vitamin C (ASCORBIC ACID) 500 MG tablet Take 500 mg by mouth daily.   06/17/2016 at Unknown time   Scheduled:  . amLODipine  5 mg Oral BID  . atorvastatin  20 mg Oral q1800  . docusate sodium  100 mg Oral Daily  . donepezil  10 mg Oral QHS  .  furosemide  20 mg Oral Daily  . levothyroxine  37.5 mcg Oral QAC breakfast  . metoprolol tartrate  25 mg Oral BID  . mometasone-formoterol  2 puff Inhalation BID  . vitamin C  500 mg Oral Daily    Assessment/Plan: 82yo female on Eliquis PTA for h/o Afib/Aflutter, admitted for ortho surgery for hip fracture; pharmacy asked by admitting MD to assess for requirement for heparin bridge until planned surgery Sunday.  Pt's CHA2DS2-VASc score is 5 but admitted w/ regular rhythm and interruption in anticoag is estimated to be <3d.  Recommend to hold  anticoag without heparin bridge and resume Eliquis when appropriate post-op.  Caroline Underwood, PharmD, BCPS  06/18/2016,1:22 AM

## 2016-06-19 ENCOUNTER — Inpatient Hospital Stay (HOSPITAL_COMMUNITY): Payer: Medicare Other | Admitting: Anesthesiology

## 2016-06-19 ENCOUNTER — Encounter (HOSPITAL_COMMUNITY)
Admission: EM | Disposition: A | Discharge status: Skilled Nursing Facility | Payer: Self-pay | Source: Home / Self Care | Attending: Internal Medicine

## 2016-06-19 ENCOUNTER — Inpatient Hospital Stay (HOSPITAL_COMMUNITY): Payer: Medicare Other

## 2016-06-19 HISTORY — PX: INTRAMEDULLARY (IM) NAIL INTERTROCHANTERIC: SHX5875

## 2016-06-19 LAB — CBC
HEMATOCRIT: 31.4 % — AB (ref 36.0–46.0)
HEMOGLOBIN: 10.6 g/dL — AB (ref 12.0–15.0)
MCH: 31 pg (ref 26.0–34.0)
MCHC: 33.8 g/dL (ref 30.0–36.0)
MCV: 91.8 fL (ref 78.0–100.0)
Platelets: 152 10*3/uL (ref 150–400)
RBC: 3.42 MIL/uL — AB (ref 3.87–5.11)
RDW: 12.4 % (ref 11.5–15.5)
WBC: 13.9 10*3/uL — AB (ref 4.0–10.5)

## 2016-06-19 LAB — BASIC METABOLIC PANEL
ANION GAP: 9 (ref 5–15)
BUN: 20 mg/dL (ref 6–20)
CHLORIDE: 102 mmol/L (ref 101–111)
CO2: 27 mmol/L (ref 22–32)
CREATININE: 1.66 mg/dL — AB (ref 0.44–1.00)
Calcium: 8.8 mg/dL — ABNORMAL LOW (ref 8.9–10.3)
GFR calc non Af Amer: 26 mL/min — ABNORMAL LOW (ref >60)
GFR, EST AFRICAN AMERICAN: 30 mL/min — AB (ref >60)
Glucose, Bld: 123 mg/dL — ABNORMAL HIGH (ref 65–99)
POTASSIUM: 4.1 mmol/L (ref 3.5–5.1)
SODIUM: 138 mmol/L (ref 135–145)

## 2016-06-19 LAB — URINE CULTURE: Culture: NO GROWTH

## 2016-06-19 LAB — MAGNESIUM: MAGNESIUM: 2 mg/dL (ref 1.7–2.4)

## 2016-06-19 SURGERY — FIXATION, FRACTURE, INTERTROCHANTERIC, WITH INTRAMEDULLARY ROD
Anesthesia: General | Site: Hip | Laterality: Left

## 2016-06-19 MED ORDER — WHITE PETROLATUM GEL
Status: AC
  Administered 2016-06-19: 13:00:00
  Filled 2016-06-19: qty 1

## 2016-06-19 MED ORDER — FENTANYL CITRATE (PF) 100 MCG/2ML IJ SOLN
25.0000 ug | INTRAMUSCULAR | Status: DC | PRN
  Administered 2016-06-19: 50 ug via INTRAVENOUS

## 2016-06-19 MED ORDER — CEFAZOLIN SODIUM-DEXTROSE 2-4 GM/100ML-% IV SOLN
2.0000 g | Freq: Four times a day (QID) | INTRAVENOUS | Status: DC
  Filled 2016-06-19 (×2): qty 100

## 2016-06-19 MED ORDER — EPHEDRINE SULFATE 50 MG/ML IJ SOLN
INTRAMUSCULAR | Status: DC | PRN
Start: 2016-06-19 — End: 2016-06-19
  Administered 2016-06-19: 10 mg via INTRAVENOUS

## 2016-06-19 MED ORDER — SODIUM CHLORIDE 0.9 % IV SOLN
INTRAVENOUS | Status: DC
  Administered 2016-06-19 – 2016-06-20 (×2): via INTRAVENOUS

## 2016-06-19 MED ORDER — MORPHINE SULFATE (PF) 2 MG/ML IV SOLN: 0.5000 mg | INTRAVENOUS | Status: DC | PRN

## 2016-06-19 MED ORDER — APIXABAN 2.5 MG PO TABS
2.5000 mg | ORAL_TABLET | Freq: Two times a day (BID) | ORAL | Status: DC
  Administered 2016-06-20 – 2016-06-21 (×3): 2.5 mg via ORAL
  Filled 2016-06-19 (×3): qty 1

## 2016-06-19 MED ORDER — ONDANSETRON HCL 4 MG/2ML IJ SOLN
INTRAMUSCULAR | Status: DC | PRN
  Administered 2016-06-19: 4 mg via INTRAVENOUS

## 2016-06-19 MED ORDER — MENTHOL 3 MG MT LOZG: 1.0000 | LOZENGE | OROMUCOSAL | Status: DC | PRN

## 2016-06-19 MED ORDER — FENTANYL CITRATE (PF) 100 MCG/2ML IJ SOLN
INTRAMUSCULAR | Status: AC
  Filled 2016-06-19: qty 2

## 2016-06-19 MED ORDER — LACTATED RINGERS IV SOLN
INTRAVENOUS | Status: DC | PRN
  Administered 2016-06-19: 07:00:00 via INTRAVENOUS

## 2016-06-19 MED ORDER — CEFAZOLIN SODIUM 1 G IJ SOLR
INTRAMUSCULAR | Status: AC
  Filled 2016-06-19: qty 20

## 2016-06-19 MED ORDER — METOCLOPRAMIDE HCL 5 MG PO TABS: 5.0000 mg | ORAL_TABLET | Freq: Three times a day (TID) | ORAL | Status: DC | PRN

## 2016-06-19 MED ORDER — ONDANSETRON HCL 4 MG PO TABS: 4.0000 mg | ORAL_TABLET | Freq: Four times a day (QID) | ORAL | Status: DC | PRN

## 2016-06-19 MED ORDER — ACETAMINOPHEN 650 MG RE SUPP: 650.0000 mg | Freq: Four times a day (QID) | RECTAL | Status: DC | PRN

## 2016-06-19 MED ORDER — LIDOCAINE HCL (CARDIAC) 20 MG/ML IV SOLN
INTRAVENOUS | Status: DC | PRN
  Administered 2016-06-19: 40 mg via INTRAVENOUS

## 2016-06-19 MED ORDER — ONDANSETRON HCL 4 MG/2ML IJ SOLN: 4.0000 mg | Freq: Once | INTRAMUSCULAR | Status: DC | PRN

## 2016-06-19 MED ORDER — ONDANSETRON HCL 4 MG/2ML IJ SOLN: 4.0000 mg | Freq: Four times a day (QID) | INTRAMUSCULAR | Status: DC | PRN

## 2016-06-19 MED ORDER — PHENOL 1.4 % MT LIQD: 1.0000 | OROMUCOSAL | Status: DC | PRN

## 2016-06-19 MED ORDER — CEFAZOLIN SODIUM-DEXTROSE 2-4 GM/100ML-% IV SOLN
2.0000 g | Freq: Two times a day (BID) | INTRAVENOUS | Status: AC
  Administered 2016-06-19 – 2016-06-20 (×2): 2 g via INTRAVENOUS
  Filled 2016-06-19 (×2): qty 100

## 2016-06-19 MED ORDER — FENTANYL CITRATE (PF) 100 MCG/2ML IJ SOLN
INTRAMUSCULAR | Status: DC | PRN
  Administered 2016-06-19: 25 ug via INTRAVENOUS
  Administered 2016-06-19: 50 ug via INTRAVENOUS
  Administered 2016-06-19: 25 ug via INTRAVENOUS

## 2016-06-19 MED ORDER — HYDROCODONE-ACETAMINOPHEN 5-325 MG PO TABS
1.0000 | ORAL_TABLET | Freq: Four times a day (QID) | ORAL | Status: DC | PRN
  Administered 2016-06-19 – 2016-06-21 (×5): 2 via ORAL
  Filled 2016-06-19 (×5): qty 2

## 2016-06-19 MED ORDER — PROPOFOL 10 MG/ML IV BOLUS
INTRAVENOUS | Status: DC | PRN
  Administered 2016-06-19: 80 mg via INTRAVENOUS

## 2016-06-19 MED ORDER — ARTIFICIAL TEARS OP OINT
TOPICAL_OINTMENT | OPHTHALMIC | Status: DC | PRN
  Administered 2016-06-19: 1 via OPHTHALMIC

## 2016-06-19 MED ORDER — 0.9 % SODIUM CHLORIDE (POUR BTL) OPTIME
TOPICAL | Status: DC | PRN
  Administered 2016-06-19: 1000 mL

## 2016-06-19 MED ORDER — PROPOFOL 10 MG/ML IV BOLUS
INTRAVENOUS | Status: AC
  Filled 2016-06-19: qty 20

## 2016-06-19 MED ORDER — ACETAMINOPHEN 325 MG PO TABS: 650.0000 mg | ORAL_TABLET | Freq: Four times a day (QID) | ORAL | Status: DC | PRN

## 2016-06-19 MED ORDER — METOCLOPRAMIDE HCL 5 MG/ML IJ SOLN: 5.0000 mg | Freq: Three times a day (TID) | INTRAMUSCULAR | Status: DC | PRN

## 2016-06-19 SURGICAL SUPPLY — 45 items
BLADE SURG 15 STRL LF DISP TIS (BLADE) ×1 IMPLANT
BLADE SURG 15 STRL SS (BLADE) ×2
BNDG GAUZE ELAST 4 BULKY (GAUZE/BANDAGES/DRESSINGS) ×3 IMPLANT
COVER PERINEAL POST (MISCELLANEOUS) ×3 IMPLANT
COVER SURGICAL LIGHT HANDLE (MISCELLANEOUS) ×3 IMPLANT
DRAPE STERI IOBAN 125X83 (DRAPES) ×3 IMPLANT
DRSG MEPILEX BORDER 4X4 (GAUZE/BANDAGES/DRESSINGS) ×6 IMPLANT
DRSG MEPILEX BORDER 4X8 (GAUZE/BANDAGES/DRESSINGS) ×3 IMPLANT
DRSG PAD ABDOMINAL 8X10 ST (GAUZE/BANDAGES/DRESSINGS) ×6 IMPLANT
DURAPREP 26ML APPLICATOR (WOUND CARE) ×3 IMPLANT
ELECT REM PT RETURN 9FT ADLT (ELECTROSURGICAL) ×3
ELECTRODE REM PT RTRN 9FT ADLT (ELECTROSURGICAL) ×1 IMPLANT
FACESHIELD WRAPAROUND (MASK) ×3 IMPLANT
GAUZE XEROFORM 1X8 LF (GAUZE/BANDAGES/DRESSINGS) ×3 IMPLANT
GAUZE XEROFORM 5X9 LF (GAUZE/BANDAGES/DRESSINGS) ×3 IMPLANT
GLOVE BIO SURGEON STRL SZ8 (GLOVE) ×3 IMPLANT
GLOVE BIOGEL PI IND STRL 8 (GLOVE) ×1 IMPLANT
GLOVE BIOGEL PI INDICATOR 8 (GLOVE) ×2
GLOVE ORTHO TXT STRL SZ7.5 (GLOVE) ×3 IMPLANT
GOWN STRL REUS W/ TWL LRG LVL3 (GOWN DISPOSABLE) ×1 IMPLANT
GOWN STRL REUS W/ TWL XL LVL3 (GOWN DISPOSABLE) ×2 IMPLANT
GOWN STRL REUS W/TWL LRG LVL3 (GOWN DISPOSABLE) ×2
GOWN STRL REUS W/TWL XL LVL3 (GOWN DISPOSABLE) ×4
GUIDE PIN 3.2X343 (PIN) ×1
GUIDE PIN 3.2X343MM (PIN) ×2
KIT BASIN OR (CUSTOM PROCEDURE TRAY) ×3 IMPLANT
KIT ROOM TURNOVER OR (KITS) ×3 IMPLANT
LINER BOOT UNIVERSAL DISP (MISCELLANEOUS) ×3 IMPLANT
MANIFOLD NEPTUNE II (INSTRUMENTS) ×3 IMPLANT
NAIL LEFT 10X36 (Nail) ×3 IMPLANT
NS IRRIG 1000ML POUR BTL (IV SOLUTION) ×3 IMPLANT
PACK GENERAL/GYN (CUSTOM PROCEDURE TRAY) ×3 IMPLANT
PAD ARMBOARD 7.5X6 YLW CONV (MISCELLANEOUS) ×6 IMPLANT
PAD CAST 4YDX4 CTTN HI CHSV (CAST SUPPLIES) ×2 IMPLANT
PADDING CAST COTTON 4X4 STRL (CAST SUPPLIES) ×4
PIN GUIDE 3.2X343MM (PIN) ×1 IMPLANT
SCREW LAG COMPR KIT 95/90 (Screw) ×3 IMPLANT
STAPLER VISISTAT 35W (STAPLE) ×3 IMPLANT
SUT VIC AB 0 CT1 27 (SUTURE) ×6
SUT VIC AB 0 CT1 27XBRD ANBCTR (SUTURE) ×3 IMPLANT
SUT VIC AB 2-0 CT1 27 (SUTURE) ×4
SUT VIC AB 2-0 CT1 TAPERPNT 27 (SUTURE) ×2 IMPLANT
TOWEL OR 17X24 6PK STRL BLUE (TOWEL DISPOSABLE) ×3 IMPLANT
TOWEL OR 17X26 10 PK STRL BLUE (TOWEL DISPOSABLE) ×3 IMPLANT
WATER STERILE IRR 1000ML POUR (IV SOLUTION) ×3 IMPLANT

## 2016-06-19 NOTE — Anesthesia Procedure Notes (Signed)
Procedure Name: LMA Insertion Date/Time: 06/19/2016 7:38 AM Performed by: Neldon Newport Pre-anesthesia Checklist: Timeout performed, Patient being monitored, Suction available, Emergency Drugs available and Patient identified Patient Re-evaluated:Patient Re-evaluated prior to inductionOxygen Delivery Method: Circle system utilized Preoxygenation: Pre-oxygenation with 100% oxygen Intubation Type: IV induction Ventilation: Mask ventilation without difficulty LMA: LMA inserted LMA Size: 4.0 Placement Confirmation: breath sounds checked- equal and bilateral and positive ETCO2 Tube secured with: Tape Dental Injury: Teeth and Oropharynx as per pre-operative assessment

## 2016-06-19 NOTE — Brief Op Note (Signed)
06/17/2016 - 06/19/2016  8:22 AM  PATIENT:  Caroline Underwood  82 y.o. female  PRE-OPERATIVE DIAGNOSIS:  left intertroch fx  POST-OPERATIVE DIAGNOSIS:  left intertroch fx  PROCEDURE:  Procedure(s): ORIF INTRAMEDULLARY (IM) NAIL INTERTROCHANTRIC HIP FRACTURE (Left)  SURGEON:  Surgeon(s) and Role:    * Mcarthur Rossetti, MD - Primary  PHYSICIAN ASSISTANT: Benita Stabile, PA-C  ANESTHESIA:   general  EBL:  Total I/O In: -  Out: 200 [Urine:100; Blood:100]  COUNTS:  YES  DICTATION: .Other Dictation: Dictation Number [id number]  PLAN OF CARE: Admit to inpatient   PATIENT DISPOSITION:  PACU - hemodynamically stable.   Delay start of Pharmacological VTE agent (>24hrs) due to surgical blood loss or risk of bleeding: no

## 2016-06-19 NOTE — Care Management Note (Signed)
Case Management Note  Patient Details  Name: Caroline Underwood MRN: [id number] Date of Birth: 1921/09/26  Subjective/Objective:                  left intertroch fx  Action/Plan: CM spoke with patient and her family at the bedside. Patient plans to be discharged to a SNF. Would like to go to [facility] at discharge.   Expected Discharge Date:       unknown           Expected Discharge Plan:  [place]  In-House Referral:     Discharge planning Services  CM Consult  Post Acute Care Choice:  NA Choice offered to:     DME Arranged:  N/A DME Agency:  NA  HH Arranged:  NA HH Agency:  NA  Status of Service:  Completed, signed off  If discussed at [place] of Stay Meetings, dates discussed:    Additional Comments:  Apolonio Schneiders, RN 06/19/2016, 10:28 AM

## 2016-06-19 NOTE — Progress Notes (Signed)
PROGRESS NOTE  Caroline Underwood [id number] DOB: [birth year] DOA: 06/17/2016 PCP: Lucretia Kern., DO  HPI/Recap of past 24 hours:  Returned from left hip surgery, awake , alert, denies pain Family not in room  Assessment/Plan: Principal Problem:   Closed fracture of femur, intertrochanteric, left, initial encounter [facility]) Active Problems:   Essential hypertension, benign   Atrial fibrillation ([place])   Chronic kidney disease   COPD (chronic obstructive pulmonary disease) ([place])   Closed left hip fracture ([place])   1. Mechanical fall/ Displaced left hip intertrochanteric fracture -  ORIF on 12/3, resume eliquis when ok with orthopedics  2. Hypokalemia;  k replaced,  Mag 2 3. Leukocytosis: possibly from stress, ua/cxr unremarkable, no fever, patient does not look septic, hold lasix, gentle hydration 4. H/o paroxysmal afib/Aflutter - sinus rhythm on lopressor.  holding Eliquis, consult pharm 5. Copd , h/o pneumothorax. Stable, no sob, no wheezing 6. Hypothyroidism: on synthroid 7. Dementia -  on aricept,  Prior to this hospitalization, she walks w walker, oriented to person only at baseline, watch for delirium , daughter states it happened during prior hospitalization 8. Hist breast cancer 9. CKD stage 3/4, cr at baseline 10. Hx AAA repair 11. HTN - cont norvasc, lopressor, hold lasix, no edema, lung clear, patient does not have appetite  Body mass index is 25.16 kg/m.   Code Status: DNR  Family Communication: patient   Disposition Plan: pending, likely will need SNF   Consultants:  orthopedics  Procedures:  Left hip ORIF INTRAMEDULLARY (IM) NAIL INTERTROCHANTRIC HIP FRACTURE (Left) on 12/3 by ortho Dr Ninfa Linden  Antibiotics:  perioperative   Objective: BP (!) 164/62 (BP Location: Right Arm)   Pulse 80   Temp 97.6 F (36.4 C)   Resp 14   Ht 5\' 4"  (1.626 m)   Wt 66.5 kg (146 lb 9.7 oz)   SpO2 98%   BMI 25.16 kg/m   Intake/Output Summary (Last 24 hours) at  06/19/16 0912 Last data filed at 06/19/16 0825  Gross per 24 hour  Intake             1040 ml  Output             1150 ml  Net             -110 ml   Filed Weights   06/18/16 0050  Weight: 66.5 kg (146 lb 9.7 oz)    Exam:   General:  NAD, calm and pleasant, seems younger than stated age  Cardiovascular: RRR  Respiratory: CTABL  Abdomen: Soft/ND/NT, positive BS  Musculoskeletal: left hip post op changes  Neuro: alert, oriented to person only, memory impaired  Data Reviewed: Basic Metabolic Panel:  Recent Labs Lab 06/17/16 1935 06/19/16 0249  NA 139 138  K 3.2* 4.1  CL 103 102  CO2 25 27  GLUCOSE 178* 123*  BUN 15 20  CREATININE 1.60* 1.66*  CALCIUM 8.7* 8.8*  MG  --  2.0   Liver Function Tests:  Recent Labs Lab 06/17/16 1935  AST 21  ALT 14  ALKPHOS 102  BILITOT 0.4  PROT 6.4*  ALBUMIN 3.3*   No results for input(s): LIPASE, AMYLASE in the last 168 hours. No results for input(s): AMMONIA in the last 168 hours. CBC:  Recent Labs Lab 06/17/16 1935 06/19/16 0249  WBC 13.8* 13.9*  NEUTROABS 10.1*  --   HGB 12.9 10.6*  HCT 37.8 31.4*  MCV 91.1 91.8  PLT 218 152   Cardiac Enzymes:  No results for input(s): CKTOTAL, CKMB, CKMBINDEX, TROPONINI in the last 168 hours. BNP (last 3 results) No results for input(s): BNP in the last 8760 hours.  ProBNP (last 3 results) No results for input(s): PROBNP in the last 8760 hours.  CBG: No results for input(s): GLUCAP in the last 168 hours.  Recent Results (from the past 240 hour(s))  Surgical pcr screen     Status: None   Collection Time: 06/18/16  8:32 AM  Result Value Ref Range Status   MRSA, PCR NEGATIVE NEGATIVE Final   Staphylococcus aureus NEGATIVE NEGATIVE Final    Comment:        The Xpert SA Assay (FDA approved for NASAL specimens in patients over 80 years of age), is one component of a comprehensive surveillance program.  Test performance has been validated by [facility] for  patients greater than or equal to 48 year old. It is not intended to diagnose infection nor to guide or monitor treatment.      Studies: No results found.  Scheduled Meds: . [MAR Hold] amLODipine  5 mg Oral BID  . [MAR Hold] atorvastatin  20 mg Oral q1800  . chlorhexidine  60 mL Topical Once  . [MAR Hold] docusate sodium  100 mg Oral Daily  . [MAR Hold] donepezil  10 mg Oral QHS  . fentaNYL      . [MAR Hold] levothyroxine  37.5 mcg Oral QAC breakfast  . [MAR Hold] metoprolol tartrate  25 mg Oral BID  . [MAR Hold] mometasone-formoterol  2 puff Inhalation BID  . [MAR Hold] pneumococcal 23 valent vaccine  0.5 mL Intramuscular Tomorrow-1000  . povidone-iodine  2 application Topical Once  . [MAR Hold] vitamin C  500 mg Oral Daily    Continuous Infusions:   Time spent: 25 mins  Demico Ploch MD, PhD  [facility] Pager [phone]. If 7PM-7AM, please contact night-coverage at [email or web address], password [facility] 06/19/2016, 9:12 AM  LOS: 2 days

## 2016-06-19 NOTE — Anesthesia Preprocedure Evaluation (Addendum)
Anesthesia Evaluation  Patient identified by MRN, date of birth, ID band Patient awake    Reviewed: Allergy & Precautions, NPO status , Patient's Chart, lab work & pertinent test results  Airway Mallampati: I  TM Distance: >3 FB Neck ROM: full    Dental  (+) Poor Dentition, Dental Advidsory Given, Missing   Pulmonary COPD, former smoker,    breath sounds clear to auscultation       Cardiovascular hypertension, + Peripheral Vascular Disease   Rhythm:Regular Rate:Normal     Neuro/Psych PSYCHIATRIC DISORDERS    GI/Hepatic GERD  Medicated and Controlled,  Endo/Other  Hypothyroidism   Renal/GU      Musculoskeletal   Abdominal   Peds  Hematology  (+) anemia ,   Anesthesia Other Findings   Reproductive/Obstetrics                          Anesthesia Physical Anesthesia Plan  ASA: III  Anesthesia Plan: General   Post-op Pain Management:    Induction: Intravenous  Airway Management Planned: Oral ETT  Additional Equipment:   Intra-op Plan:   Post-operative Plan: Extubation in OR  Informed Consent: I have reviewed the patients History and Physical, chart, labs and discussed the procedure including the risks, benefits and alternatives for the proposed anesthesia with the patient or authorized representative who has indicated his/her understanding and acceptance.   Dental Advisory Given  Plan Discussed with: CRNA, Anesthesiologist and Surgeon  Anesthesia Plan Comments:        Anesthesia Quick Evaluation

## 2016-06-19 NOTE — Transfer of Care (Signed)
Immediate Anesthesia Transfer of Care Note  Patient: Caroline Underwood  Procedure(s) Performed: Procedure(s): ORIF INTRAMEDULLARY (IM) NAIL INTERTROCHANTRIC HIP FRACTURE (Left)  Patient Location: PACU  Anesthesia Type:General  Level of Consciousness: sedated  Airway & Oxygen Therapy: Patient Spontanous Breathing and Patient connected to nasal cannula oxygen  Post-op Assessment: Report given to RN, Post -op Vital signs reviewed and stable and Patient moving all extremities X 4  Post vital signs: Reviewed and stable  Last Vitals:  Vitals:   06/18/16 2303 06/19/16 0557  BP: (!) 154/59 (!) 175/58  Pulse: 60 78  Resp: 19 18  Temp: 36.7 C 36.8 C    Last Pain:  Vitals:   06/19/16 0557  TempSrc: Oral  PainSc:          Complications: No apparent anesthesia complications

## 2016-06-19 NOTE — Anesthesia Postprocedure Evaluation (Signed)
Anesthesia Post Note  Patient: Shatora Blazejewski  Procedure(s) Performed: Procedure(s) (LRB): ORIF INTRAMEDULLARY (IM) NAIL INTERTROCHANTRIC HIP FRACTURE (Left)  Patient location during evaluation: PACU Anesthesia Type: General Level of consciousness: awake and confused Pain management: pain level controlled Vital Signs Assessment: post-procedure vital signs reviewed and stable Respiratory status: spontaneous breathing, nonlabored ventilation and respiratory function stable Cardiovascular status: blood pressure returned to baseline Anesthetic complications: no    Last Vitals:  Vitals:   06/19/16 0926 06/19/16 1017  BP: (!) 170/61 (!) 166/64  Pulse: 80 87  Resp: 12 16  Temp: 36.4 C 36.4 C    Last Pain:  Vitals:   06/19/16 1135  TempSrc:   PainSc: Asleep                 Kayvion Arneson COKER

## 2016-06-20 ENCOUNTER — Encounter (HOSPITAL_COMMUNITY): Payer: Self-pay | Admitting: Orthopaedic Surgery

## 2016-06-20 LAB — BASIC METABOLIC PANEL
ANION GAP: 7 (ref 5–15)
BUN: 17 mg/dL (ref 6–20)
CO2: 26 mmol/L (ref 22–32)
Calcium: 7.7 mg/dL — ABNORMAL LOW (ref 8.9–10.3)
Chloride: 102 mmol/L (ref 101–111)
Creatinine, Ser: 1.4 mg/dL — ABNORMAL HIGH (ref 0.44–1.00)
GFR calc Af Amer: 37 mL/min — ABNORMAL LOW (ref >60)
GFR, EST NON AFRICAN AMERICAN: 32 mL/min — AB (ref >60)
GLUCOSE: 125 mg/dL — AB (ref 65–99)
POTASSIUM: 4 mmol/L (ref 3.5–5.1)
Sodium: 135 mmol/L (ref 135–145)

## 2016-06-20 LAB — CBC
HEMATOCRIT: 25.3 % — AB (ref 36.0–46.0)
HEMOGLOBIN: 8.5 g/dL — AB (ref 12.0–15.0)
MCH: 30.8 pg (ref 26.0–34.0)
MCHC: 33.6 g/dL (ref 30.0–36.0)
MCV: 91.7 fL (ref 78.0–100.0)
PLATELETS: 128 10*3/uL — AB (ref 150–400)
RBC: 2.76 MIL/uL — AB (ref 3.87–5.11)
RDW: 12.5 % (ref 11.5–15.5)
WBC: 11.8 10*3/uL — AB (ref 4.0–10.5)

## 2016-06-20 LAB — PREPARE RBC (CROSSMATCH)

## 2016-06-20 MED ORDER — POLYETHYLENE GLYCOL 3350 17 G PO PACK
17.0000 g | PACK | Freq: Every day | ORAL | Status: DC
  Administered 2016-06-20 – 2016-06-21 (×2): 17 g via ORAL
  Filled 2016-06-20 (×2): qty 1

## 2016-06-20 MED ORDER — LACTATED RINGERS IV SOLN
INTRAVENOUS | Status: AC
  Administered 2016-06-20 – 2016-06-21 (×2): via INTRAVENOUS

## 2016-06-20 MED ORDER — SENNOSIDES-DOCUSATE SODIUM 8.6-50 MG PO TABS
1.0000 | ORAL_TABLET | Freq: Two times a day (BID) | ORAL | Status: DC
  Administered 2016-06-20 – 2016-06-21 (×3): 1 via ORAL
  Filled 2016-06-20 (×3): qty 1

## 2016-06-20 MED ORDER — SODIUM CHLORIDE 0.9 % IV SOLN: Freq: Once | INTRAVENOUS | Status: DC

## 2016-06-20 NOTE — Progress Notes (Signed)
Pt has not voided since 0645hrs when foley was removed. Bladder scanned with 69cc noted in bladder

## 2016-06-20 NOTE — Progress Notes (Signed)
Pt's daughter is asking Education officer, museum to call her and discuss placement arrangements at discharge

## 2016-06-20 NOTE — Progress Notes (Signed)
Subjective: 1 Day Post-Op Procedure(s) (LRB): ORIF INTRAMEDULLARY (IM) NAIL INTERTROCHANTRIC HIP FRACTURE (Left) Patient reports pain as moderate.  Tolerated surgery yesterday.  Acute blood loss anemia from her fracture and surgery.  Vitals stable thus far.  Objective: Vital signs in last 24 hours: Temp:  [97.6 F (36.4 C)-98.3 F (36.8 C)] 98.3 F (36.8 C) (12/04 0615) Pulse Rate:  [71-111] 79 (12/04 0615) Resp:  [12-18] 18 (12/04 0615) BP: (97-183)/(57-71) 152/57 (12/04 0615) SpO2:  [93 %-100 %] 96 % (12/04 0615) FiO2 (%):  [0 %] 0 % (12/03 1001) Weight:  [145 lb 11.6 oz (66.1 kg)] 145 lb 11.6 oz (66.1 kg) (12/03 1005)  Intake/Output from previous day: 12/03 0701 - 12/04 0700 In: 2771.3 [P.O.:600; I.V.:1971.3; IV Piggyback:200] Out: 1600 [Urine:1500; Blood:100] Intake/Output this shift: Total I/O In: 1301.3 [P.O.:120; I.V.:1081.3; IV Piggyback:100] Out: 650 [Urine:650]   Recent Labs  06/17/16 1935 06/19/16 0249 06/20/16 0505  HGB 12.9 10.6* 8.5*    Recent Labs  06/19/16 0249 06/20/16 0505  WBC 13.9* 11.8*  RBC 3.42* 2.76*  HCT 31.4* 25.3*  PLT 152 128*    Recent Labs  06/19/16 0249 06/20/16 0505  NA 138 135  K 4.1 4.0  CL 102 102  CO2 27 26  BUN 20 17  CREATININE 1.66* 1.40*  GLUCOSE 123* 125*  CALCIUM 8.8* 7.7*    Recent Labs  06/17/16 1935  INR 1.09    Intact pulses distally Dorsiflexion/Plantar flexion intact Incision: dressing C/D/I  Assessment/Plan: 1 Day Post-Op Procedure(s) (LRB): ORIF INTRAMEDULLARY (IM) NAIL INTERTROCHANTRIC HIP FRACTURE (Left) Up with therapy - WBAT left hip Resume Eliquis Social Work consult - skilled [place] 06/20/2016, 6:52 AM

## 2016-06-20 NOTE — Progress Notes (Signed)
Unit of blood transfusing as ordered by MD

## 2016-06-20 NOTE — Clinical Social Work Note (Signed)
Insurance authorization initiated with [employer] and [employer] skilled facility selected. Contact made with Claiborne Billings, admissions director at [facility] and they can take patient. Ms. Sendejo will be ready for discharge on Tuesday, 12/5.  Arianny Pun Givens, MSW, LCSW Licensed Clinical Social Worker [place] [phone]

## 2016-06-20 NOTE — Progress Notes (Signed)
Physical Therapy Evaluation Patient Details Name: Caroline Underwood MRN: [id number] DOB: 1921/10/07 Today's Date: 06/20/2016   History of Present Illness  Pt is a 82 y/o female who presents with L hip intertrochanteric fracture with ORIF on 06/19/16. Pt fell at home and was not able to move her L leg. She also hit her head. Pt has a significant PMH of breast cancer, PVD/AAA repair, HTN, dementia, CKD 3/4 and COPD.   Clinical Impression  PTA, pt ambulated around her home with a RW and used a transport chair for community mobility. Pt lives with daughter who is her primary caregiver. Pt was able to transfer from supine to sit with mod assist +2 today. When pt stood up for stand pivot transfer, pt urinated on herself but was able to complete the transfer to chair. Pt again urinated with assist to transfer towards bedside commode. Pt fatigues quickly and is not able to maintain balance without mod to max assist for upright posture; a second therapist was required to pull up a chair behind her as she could not take steps backwards to get to the chair. Pt became increasingly more fatigued and required increased assist throughout the session. Pt had less difficulty transferring towards her R>L. Pt is a good candidate for SNF to maximize strength and mobility gains before return to home. PT will continue to follow acutely.    Follow Up Recommendations SNF    Equipment Recommendations  3in1 (PT)    Recommendations for Other Services       Precautions / Restrictions Precautions Precautions: Fall Restrictions Weight Bearing Restrictions: Yes LLE Weight Bearing: Weight bearing as tolerated      Mobility  Bed Mobility Overal bed mobility: +2 for physical assistance Bed Mobility: Supine to Sit     Supine to sit: Mod assist     General bed mobility comments: pt required assist to elevate trunk to upright position; HOB elevated; use of bed rails  Transfers Overall transfer level: Needs  assistance Equipment used: Rolling walker (2 wheeled) Transfers: Stand Pivot Transfers Sit to Stand: Mod assist Stand pivot transfers: Max assist;Mod assist;+2 safety/equipment       General transfer comment: Pt performed sit<>stand x4; initially required mod assist but required mod to max assist as pt began to fatigue. Chair needed to be moved, pt said she needed to sit and was not able to shift weight to step backwards.  Mod cues needed for weight shift and sequencing steps    Ambulation/Gait                Stairs            Wheelchair Mobility    Modified Rankin (Stroke Patients Only)       Balance Overall balance assessment: Needs assistance Sitting-balance support: Bilateral upper extremity supported;Feet supported Sitting balance-Leahy Scale: Poor Sitting balance - Comments: pt required B UE to maintain sitting balance at EOB   Standing balance support: Bilateral upper extremity supported Standing balance-Leahy Scale: Poor Standing balance comment: Pt requires B UE support to maintain static standing balance and is unable to get trunk fully upright without significant verbal cues                             Pertinent Vitals/Pain Pain Assessment: No/denies pain    Home Living Family/patient expects to be discharged to:: Skilled nursing facility Living Arrangements: Children Available Help at Discharge: Family Type of Home: House Home Access:  Stairs to enter Entrance Stairs-Rails: None Entrance Stairs-Number of Steps: 1 Home Layout: One level Home Equipment: [place] - 2 wheels;Cane - single point;Transport chair;Grab bars - tub/shower;Shower seat      Prior Function Level of Independence: Independent with assistive device(s)         Comments: getting around really well with walker     Hand Dominance   Dominant Hand: Right    Extremity/Trunk Assessment   Upper Extremity Assessment: Generalized weakness           Lower  Extremity Assessment: LLE deficits/detail   LLE Deficits / Details: difficult to assess 2/2 pain     Communication   Communication: Other (comment) (pt with dementia)  Cognition Arousal/Alertness: Awake/alert Behavior During Therapy: WFL for tasks assessed/performed Overall Cognitive Status: History of cognitive impairments - at baseline                      General Comments      Exercises General Exercises - Lower Extremity Ankle Circles/Pumps: AROM;Both;10 reps;Seated Quad Sets: AROM;Left;10 reps;Seated Heel Slides: AROM;Left;10 reps;Seated   Assessment/Plan    PT Assessment Patient needs continued PT services  PT Problem List Decreased strength;Decreased range of motion;Decreased activity tolerance;Decreased balance;Decreased mobility;Decreased knowledge of use of DME;Decreased cognition;Decreased safety awareness;Decreased knowledge of precautions          PT Treatment Interventions DME instruction;Gait training;Stair training;Functional mobility training;Therapeutic activities;Therapeutic exercise;Balance training;Patient/family education    PT Goals (Current goals can be found in the Care Plan section)  Acute Rehab PT Goals Patient Stated Goal: to get back home PT Goal Formulation: With patient/family Time For Goal Achievement: 07/04/16 Potential to Achieve Goals: Fair    Frequency Min 3X/week (would benefit from 5x/week)   Barriers to discharge        Co-evaluation               End of Session Equipment Utilized During Treatment: Gait belt Activity Tolerance: Patient limited by fatigue Patient left: in chair;with call bell/phone within reach;with family/visitor present;with chair alarm set Nurse Communication: Mobility status         Time: 0912-1003 PT Time Calculation (min) (ACUTE ONLY): 51 min   Charges:   PT Evaluation $PT Eval High Complexity: 1 Procedure PT Treatments $Therapeutic Exercise: 8-22 mins $Therapeutic Activity: 8-22  mins   PT G Codes:        Tonia Brooms 07/02/2016, 1:02 PM Tonia Brooms, SPT [phone]

## 2016-06-20 NOTE — Progress Notes (Signed)
Unit of blood completed transfusing as ordered. No s/s of transfusion reaction noted

## 2016-06-20 NOTE — Progress Notes (Signed)
Pt was finally able to void 300cc as charted

## 2016-06-20 NOTE — Op Note (Signed)
NAMECLEMMA, MANDALA           ACCOUNT NO.:  [id number]  MEDICAL RECORD NO.:  [id number]  LOCATION:  [id number]                        FACILITY:  [place]  PHYSICIAN:  Lind Guest. Ninfa Linden, M.D.DATE OF BIRTH:  1922-04-21  DATE OF PROCEDURE:  06/19/2016 DATE OF DISCHARGE:                              OPERATIVE REPORT   PREOPERATIVE DIAGNOSIS:  Displaced closed left hip intertrochanteric fracture.  POSTOPERATIVE DIAGNOSIS:  Displaced closed left hip intertrochanteric fracture.  PROCEDURE:  Open reduction and internal fixation of left hip intertrochanteric fracture utilizing cephalomedullary nail and hip screw construct.  IMPLANTS:  Tam[employer]hew INTERTAN 10 x 360 femoral nail with a 95-90 lag screw, compression screw construct.  SURGEON:  Lind Guest. Ninfa Linden, M.D.  ASSISTANT:  Erskine Emery, PA-C.  ANESTHESIA:  General.  BLOOD LOSS:  Less than 100 mL.  ANTIBIOTICS:  2 g of IV Ancef.  COMPLICATIONS:  None.  INDICATIONS:  Ms. Hindman is a 82 year old female with some dementia who sustained a mechanical fall the night before last injuring her left hip. She was transported to the [facility] and found to have an intertrochanteric proximal femur fracture.  She is on Eliquis so due to bleeding risks we waited an extra day before proceeding with surgery.  I talked to her and her and family in length about the surgery our recommendations for surgery to decrease her pain, improve her mobility, and overall improve her quality of life.  They understand this as well as the risks and benefits that are involved and do wish to proceed with surgery for quality of life issues.  PROCEDURE DESCRIPTION:  After informed consent was obtained, appropriate left hip was marked.  She was brought to the operating room.  General anesthesia was obtained while she was on her stretcher.  Next, she was placed supine on the fracture table with a perineal post in place, her left leg in  in-line skeletal traction, her right hip in abduction stirrup abducted and flexed out of the field.  We were able to pull traction of the leg with internal rotation and abduction, reduced the fracture under direct fluoroscopy.  We then chose our rod within the sterile box, holding it up against the leg under fluoroscopy to get an idea of our leg length and size of the nail, so we chose a left Smith and Nephew 10 x 360 INTERTAN nail and had this opened sterilely on the back table.  We then prepped her left hip with DuraPrep and sterile drapes.  A time-out was called, and she was identified as correct patient and correct left hip.  We then made an incision proximal to the greater trochanter and dissected down the tip of the greater trochanter, and a temporary guide pin was then inserted in antegrade fashion from the tip of the greater trochanter just past the lesser trochanter.  We then used initiating reamer to open up the femoral canal and then we were able to remove the guide pin and easily passed our femoral nail without needing to ream all the way down the leg.  This was verified again under fluoroscopic guidance.  Using the outrigger guide, we then made a separate lateral incision and placed a temporary guide  pin traversing the fracture from the lateral cortex of the femur across the fracture into good position in the femoral head.  We took a measurement off this and we chose our 95/90 dual lag screw, compression screw constructs and drilled to these levels.  We placed a lag screw without difficulty followed by the compression screw component.  We let the traction off the leg but right before we compressed we were able to easily compress the fracture.  We then removed the outrigger guide and all instrumentation.  We put the hip through internal and external rotation.  We were pleased with the position and stability of the fracture.  We then irrigated the soft tissue with normal saline  solution and closed the deep tissue with 0 Vicryl, followed by 2-0 Vicryl in the subcutaneous tissue, and interrupted staples on the skin.  She was then taken off the fracture table, awakened, extubated, and taken to recovery room in stable condition.  All final counts were correct.  There were no complications noted.  Of note, Erskine Emery, PA-C assisted in the entire case, his assistance was helpful in facilitating the entire case.     Lind Guest. Ninfa Linden, M.D.     CYB/MEDQ  D:  06/19/2016  T:  06/20/2016  Job:  [id number]

## 2016-06-20 NOTE — Discharge Instructions (Signed)
Expect left thigh swelling. New dry dressings as needed left hip incisions. Weight bearing as tolerated left hip.

## 2016-06-20 NOTE — Progress Notes (Addendum)
PROGRESS NOTE  Caroline Underwood [id number] DOB: 05-Nov-1921 DOA: 06/17/2016 PCP: Lucretia Kern., DO  HPI/Recap of past 24 hours:  left hip ORIF on 12/3,  She is sitting in chair, denies pain at rest,  Daughter at bedside, report has not had BM for several days   Assessment/Plan: Principal Problem:   Closed fracture of femur, intertrochanteric, left, initial encounter [facility]) Active Problems:   Essential hypertension, benign   Atrial fibrillation ([place])   Chronic kidney disease   COPD (chronic obstructive pulmonary disease) ([place])   Closed left hip fracture ([place])   1. Mechanical fall/ Displaced left hip intertrochanteric fracture -  ORIF on 12/3, ortho oked to resume eliquis.  2. Post op anemia: transfuse prbc x1unit on 12/4 3. Hypokalemia;  k replaced,  Mag 2 4. Leukocytosis: possibly from stress, ua/cxr unremarkable, no fever, patient does not look septic, hold lasix, gentle hydration, improved 5. H/o paroxysmal afib/Aflutter - sinus rhythm on lopressor.  Eliquis held for surgery, resumed post op 6. Copd , h/o pneumothorax. Stable, no sob, no wheezing 7. Hypothyroidism: on synthroid 8. Dementia -  on aricept,  Prior to this hospitalization, she walks w walker, oriented to person only at baseline, watch for delirium , daughter states it happened during prior hospitalization 9. Hist breast cancer 10. CKD stage 3/4, cr at baseline 11. Hx AAA repair 12. HTN - cont norvasc, lopressor, hold lasix, no edema, lung clear, patient does not have appetite  Body mass index is 25.01 kg/m.  Constipation: stool softener   Code Status: DNR  Family Communication: patient and daughter at bedside  Disposition Plan: SNF on 12/5   Consultants:  orthopedics  Procedures:  Left hip ORIF INTRAMEDULLARY (IM) NAIL INTERTROCHANTRIC HIP FRACTURE (Left) on 12/3 by ortho Dr Ninfa Linden  Antibiotics:  perioperative   Objective: BP (!) 152/57 (BP Location: Left Arm)   Pulse 79   Temp  98.3 F (36.8 C) (Oral)   Resp 18   Ht 5\' 4"  (1.626 m)   Wt 66.1 kg (145 lb 11.6 oz)   SpO2 95%   BMI 25.01 kg/m   Intake/Output Summary (Last 24 hours) at 06/20/16 1022 Last data filed at 06/20/16 0500  Gross per 24 hour  Intake          1971.25 ml  Output             1200 ml  Net           771.25 ml   Filed Weights   06/18/16 0050 06/19/16 1005  Weight: 66.5 kg (146 lb 9.7 oz) 66.1 kg (145 lb 11.6 oz)    Exam:   General:  NAD, calm and pleasant, seems younger than stated age  Cardiovascular: RRR  Respiratory: CTABL  Abdomen: Soft/ND/NT, positive BS  Musculoskeletal: left hip post op changes  Neuro: alert, oriented to person only, memory impaired  Data Reviewed: Basic Metabolic Panel:  Recent Labs Lab 06/17/16 1935 06/19/16 0249 06/20/16 0505  NA 139 138 135  K 3.2* 4.1 4.0  CL 103 102 102  CO2 25 27 26   GLUCOSE 178* 123* 125*  BUN 15 20 17   CREATININE 1.60* 1.66* 1.40*  CALCIUM 8.7* 8.8* 7.7*  MG  --  2.0  --    Liver Function Tests:  Recent Labs Lab 06/17/16 1935  AST 21  ALT 14  ALKPHOS 102  BILITOT 0.4  PROT 6.4*  ALBUMIN 3.3*   No results for input(s): LIPASE, AMYLASE in the last 168 hours. No  results for input(s): AMMONIA in the last 168 hours. CBC:  Recent Labs Lab 06/17/16 1935 06/19/16 0249 06/20/16 0505  WBC 13.8* 13.9* 11.8*  NEUTROABS 10.1*  --   --   HGB 12.9 10.6* 8.5*  HCT 37.8 31.4* 25.3*  MCV 91.1 91.8 91.7  PLT 218 152 128*   Cardiac Enzymes:   No results for input(s): CKTOTAL, CKMB, CKMBINDEX, TROPONINI in the last 168 hours. BNP (last 3 results) No results for input(s): BNP in the last 8760 hours.  ProBNP (last 3 results) No results for input(s): PROBNP in the last 8760 hours.  CBG: No results for input(s): GLUCAP in the last 168 hours.  Recent Results (from the past 240 hour(s))  Surgical pcr screen     Status: None   Collection Time: 06/18/16  8:32 AM  Result Value Ref Range Status   MRSA, PCR  NEGATIVE NEGATIVE Final   Staphylococcus aureus NEGATIVE NEGATIVE Final    Comment:        The Xpert SA Assay (FDA approved for NASAL specimens in patients over 68 years of age), is one component of a comprehensive surveillance program.  Test performance has been validated by [facility] for patients greater than or equal to 38 year old. It is not intended to diagnose infection nor to guide or monitor treatment.   Urine culture     Status: None   Collection Time: 06/18/16 10:57 AM  Result Value Ref Range Status   Specimen Description URINE, RANDOM  Final   Special Requests NONE  Final   Culture NO GROWTH  Final   Report Status 06/19/2016 FINAL  Final     Studies: No results found.  Scheduled Meds: . sodium chloride   Intravenous Once  . amLODipine  5 mg Oral BID  . apixaban  2.5 mg Oral BID  . atorvastatin  20 mg Oral q1800  . docusate sodium  100 mg Oral Daily  . donepezil  10 mg Oral QHS  . levothyroxine  37.5 mcg Oral QAC breakfast  . metoprolol tartrate  25 mg Oral BID  . mometasone-formoterol  2 puff Inhalation BID  . pneumococcal 23 valent vaccine  0.5 mL Intramuscular Tomorrow-1000  . vitamin C  500 mg Oral Daily    Continuous Infusions:   Time spent: 25 mins  Hatley Henegar MD, PhD  [facility] Pager [phone]. If 7PM-7AM, please contact night-coverage at [email or web address], password [facility] 06/20/2016, 10:22 AM  LOS: 3 days

## 2016-06-21 LAB — TYPE AND SCREEN
BLOOD PRODUCT EXPIRATION DATE: 201712152359
BLOOD PRODUCT EXPIRATION DATE: 201712162359
ISSUE DATE / TIME: 201712041428
UNIT TYPE AND RH: 5100
Unit Type and Rh: 5100

## 2016-06-21 LAB — BASIC METABOLIC PANEL
Anion gap: 8 (ref 5–15)
BUN: 17 mg/dL (ref 6–20)
CHLORIDE: 105 mmol/L (ref 101–111)
CO2: 25 mmol/L (ref 22–32)
Calcium: 8.2 mg/dL — ABNORMAL LOW (ref 8.9–10.3)
Creatinine, Ser: 1.33 mg/dL — ABNORMAL HIGH (ref 0.44–1.00)
GFR calc non Af Amer: 34 mL/min — ABNORMAL LOW (ref >60)
GFR, EST AFRICAN AMERICAN: 39 mL/min — AB (ref >60)
Glucose, Bld: 102 mg/dL — ABNORMAL HIGH (ref 65–99)
POTASSIUM: 3.5 mmol/L (ref 3.5–5.1)
SODIUM: 138 mmol/L (ref 135–145)

## 2016-06-21 LAB — CBC
HCT: 26.1 % — ABNORMAL LOW (ref 36.0–46.0)
HEMOGLOBIN: 9 g/dL — AB (ref 12.0–15.0)
MCH: 31 pg (ref 26.0–34.0)
MCHC: 34.5 g/dL (ref 30.0–36.0)
MCV: 90 fL (ref 78.0–100.0)
Platelets: 145 10*3/uL — ABNORMAL LOW (ref 150–400)
RBC: 2.9 MIL/uL — AB (ref 3.87–5.11)
RDW: 13.5 % (ref 11.5–15.5)
WBC: 14.7 10*3/uL — ABNORMAL HIGH (ref 4.0–10.5)

## 2016-06-21 MED ORDER — HYDROCODONE-ACETAMINOPHEN 5-325 MG PO TABS: 1.0000 | ORAL_TABLET | Freq: Four times a day (QID) | ORAL | 0 refills | Status: AC | PRN

## 2016-06-21 MED ORDER — ENSURE ENLIVE PO LIQD: 237.0000 mL | Freq: Two times a day (BID) | ORAL | 12 refills | Status: DC

## 2016-06-21 MED ORDER — FUROSEMIDE 40 MG PO TABS: 40.0000 mg | ORAL_TABLET | Freq: Every day | ORAL | 0 refills | Status: DC | PRN

## 2016-06-21 MED ORDER — POTASSIUM CHLORIDE CRYS ER 20 MEQ PO TBCR
40.0000 meq | EXTENDED_RELEASE_TABLET | Freq: Once | ORAL | Status: AC
  Administered 2016-06-21: 40 meq via ORAL
  Filled 2016-06-21: qty 2

## 2016-06-21 MED ORDER — POTASSIUM CHLORIDE ER 10 MEQ PO TBCR: 10.0000 meq | EXTENDED_RELEASE_TABLET | Freq: Every day | ORAL | 0 refills | Status: DC

## 2016-06-21 MED ORDER — POLYETHYLENE GLYCOL 3350 17 G PO PACK: 17.0000 g | PACK | Freq: Every day | ORAL | 0 refills | Status: DC

## 2016-06-21 MED ORDER — SENNOSIDES-DOCUSATE SODIUM 8.6-50 MG PO TABS: 1.0000 | ORAL_TABLET | Freq: Every day | ORAL | 0 refills | Status: DC

## 2016-06-21 MED ORDER — ENSURE ENLIVE PO LIQD
237.0000 mL | Freq: Two times a day (BID) | ORAL | Status: DC
  Administered 2016-06-21: 237 mL via ORAL

## 2016-06-21 NOTE — Progress Notes (Signed)
OT Cancellation Note  Patient Details Name: Caroline Underwood MRN: [id number] DOB: 1922/01/22   Cancelled Treatment:    Reason Eval/Treat Not Completed: Other (comment) Pt current D/C plan is SNF. No apparent immediate acute care OT needs, therefore will defer OT to SNF. If OT eval is needed please call Acute Rehab Dept. at [phone] or text page OT at [phone].    [place], OTR/L  [id number] 06/21/2016 06/21/2016, 10:39 AM

## 2016-06-21 NOTE — Care Management Important Message (Signed)
Important Message  Patient Details  Name: Caroline Underwood MRN: [id number] Date of Birth: [birth year]   Medicare Important Message Given:  Yes    [name] 06/21/2016, 11:02 AM

## 2016-06-21 NOTE — Progress Notes (Signed)
Blue Medicare auth received per [employer] [phone] [place] RVC). CSW made [place] at [facility] aware of auth verified they are able to accept pt today. Pt's dtr at bedside and requesting ambulance for transport. RN to call report to [phone]. DC summary and orders sent to [facility]. Will arranged [facility] for transport. CSW signing off at d/c.   Wandra Feinstein, MSW, LCSW [phone] (coverage)

## 2016-06-21 NOTE — Progress Notes (Signed)
Pt discharged to SNF via [facility].  IV removed.  Report called and given to Amme.  Daugther at bedside.

## 2016-06-21 NOTE — Discharge Summary (Signed)
Discharge Summary  Caroline Underwood [id number] DOB: 07-30-1921  PCP: Lucretia Kern., DO  Admit date: 06/17/2016 Discharge date: 06/21/2016  Time spent: >65mins  Recommendations for Outpatient Follow-up:  1. F/u with SNF MD PMD  for hospital discharge follow up, repeat cbc/bmp at follow up 2. F/u with orthopedics Dr Ninfa Linden   Discharge Diagnoses:  Active Hospital Problems   Diagnosis Date Noted  . Closed fracture of femur, intertrochanteric, left, initial encounter ([place]) 06/17/2016  . Closed left hip fracture ([place]) 06/18/2016  . Essential hypertension, benign 01/02/2013  . Atrial fibrillation ([place]) 01/02/2013  . Chronic kidney disease 01/02/2013  . COPD (chronic obstructive pulmonary disease) ([place]) 01/02/2013    Resolved Hospital Problems   Diagnosis Date Noted Date Resolved  . Hip fracture ([place]) 06/17/2016 06/18/2016    Discharge Condition: stable  Diet recommendation: heart healthy  Filed Weights   06/18/16 0050 06/19/16 1005  Weight: 66.5 kg (146 lb 9.7 oz) 66.1 kg (145 lb 11.6 oz)    History of present illness:  PCP: Lucretia Kern., DO   Chief Complaint: Fall, L hip pain  HPI: Caroline Underwood is a 82 y.o. female with history of breast cancer, PVD/ AAA repair, HTN, dementia, CKD3/4 and COPD who presented to ED today after falling at home.  EMS called and brought to ED w L hip pain and unable to move the left leg.  Also hit her head.  Xrays showed L hip fracture.  CT head neg for bleed and CT C-spine neg for fracture.  Asked to see for admission. Pt takes Eliquis - ortho said they would need to hold off on surgery until Eliquis out of the system. Planning OR on Sunday per ED MD report.    Daughter provides history.  They are from [place], she is widowed, her husband died ~ 33, he was a Agricultural consultant in [place].  Pt has Alzheimer's dementia, daughter looks after her.  Walks with a walker.  Has COPD, quit smoking years ago.  Has afib/flutter on Xarelto.   Signer papers for DNR last week.   Takes colace once daily o/w will get constipated. Uses diapers at home.    Had lumpectomy for breast Ca 3 yrs ago in [place], this was before the AD set in.  Had AAA repair as well some yrs ago.  3 years ago fell and went to [facility] for rehab.     Hospital Course:  Principal Problem:   Closed fracture of femur, intertrochanteric, left, initial encounter [facility]) Active Problems:   Essential hypertension, benign   Atrial fibrillation ([facility])   Chronic kidney disease   COPD (chronic obstructive pulmonary disease) ([facility])   Closed left hip fracture ([place])   1. Mechanical fall/ Displaced left hip intertrochanteric fracture -  ORIF on 12/3, ortho oked to resume eliquis. D/c to SNF. 2. Post op anemia: transfuse prbc x1unit on 12/4 3. Hypokalemia;  k replaced,  Mag 2 4. Leukocytosis: possibly from stress, ua/cxr unremarkable, no fever, patient does not look septic, hold lasix, gentle hydration, improved. Encourage oral intake 5. H/o paroxysmal afib/Aflutter - sinus rhythm on lopressor.  Eliquis held for surgery, resumed post op 6. Copd , h/o pneumothorax. Stable, no sob, no wheezing. On room air. 7. Hypothyroidism: on synthroid 8. Dementia -  on aricept,  Prior to this hospitalization, she walks w walker, oriented to person only at baseline, no delirium during hospitalization 9. History  breast cancer s/p lumpectomy three years ago 79. CKD stage 3/4, cr at baseline 11.  Hx AAA repair 12. HTN - cont norvasc, lopressor, hold lasix, no edema, lung clear, patient does not have appetite  12. Body mass index is 25.01 kg/m.  13. Constipation: stool softener   Code Status: DNR  Family Communication: patient and daughter at bedside  Disposition Plan: SNF on 12/5   Consultants:  orthopedics  Procedures:  Left hip ORIF INTRAMEDULLARY (IM) NAIL INTERTROCHANTRIC HIP FRACTURE (Left) on 12/3 by ortho Dr  Ninfa Linden  Antibiotics:  Perioperative ancef    Discharge Exam: BP (!) 129/51 (BP Location: Right Arm)   Pulse 87   Temp 98.4 F (36.9 C) (Oral)   Resp 16   Ht 5\' 4"  (1.626 m)   Wt 66.1 kg (145 lb 11.6 oz)   SpO2 92%   BMI 25.01 kg/m    General:  NAD, calm and pleasant, seems younger than stated age  Cardiovascular: RRR  Respiratory: CTABL  Abdomen: Soft/ND/NT, positive BS  Musculoskeletal: left hip post op changes  Neuro: alert, oriented to person only, memory impaired    Discharge Instructions You were cared for by a hospitalist during your hospital stay. If you have any questions about your discharge medications or the care you received while you were in the hospital after you are discharged, you can call the unit and asked to speak with the hospitalist on call if the hospitalist that took care of you is not available. Once you are discharged, your primary care physician will handle any further medical issues. Please note that NO REFILLS for any discharge medications will be authorized once you are discharged, as it is imperative that you return to your primary care physician (or establish a relationship with a primary care physician if you do not have one) for your aftercare needs so that they can reassess your need for medications and monitor your lab values.  Discharge Instructions    Diet - low sodium heart healthy    Complete by:  As directed    Increase activity slowly    Complete by:  As directed        Medication List    STOP taking these medications   docusate sodium 100 MG capsule Commonly known as:  COLACE     TAKE these medications   ADVAIR DISKUS 100-50 MCG/DOSE Aepb Generic drug:  Fluticasone-Salmeterol INHALE ONE DOSE BY MOUTH EVERY 12 HOURS What changed:  See the new instructions.   amLODipine 5 MG tablet Commonly known as:  NORVASC Take 5 mg by mouth twice daily   atorvastatin 20 MG tablet Commonly known as:  LIPITOR TAKE ONE  TABLET BY MOUTH IN THE EVENING AT 6 PM What changed:  See the new instructions.   donepezil 10 MG tablet Commonly known as:  ARICEPT TAKE ONE TABLET BY MOUTH AT BEDTIME What changed:  See the new instructions.   ELIQUIS 2.5 MG Tabs tablet Generic drug:  apixaban TAKE ONE TABLET BY MOUTH TWICE DAILY   feeding supplement (ENSURE ENLIVE) Liqd Take 237 mLs by mouth 2 (two) times daily between meals.   furosemide 40 MG tablet Commonly known as:  LASIX Take 1 tablet (40 mg total) by mouth daily as needed for fluid or edema. What changed:  See the new instructions.   HYDROcodone-acetaminophen 5-325 MG tablet Commonly known as:  NORCO/VICODIN Take 1 tablet by mouth every 6 (six) hours as needed for moderate pain.   levothyroxine 25 MCG tablet Commonly known as:  SYNTHROID, LEVOTHROID TAKE ONE & ONE-HALF TABLETS BY MOUTH ONCE DAILY IN  THE MORNING BEFORE BREAKFAST What changed:  See the new instructions.   Melatonin 5 MG Caps Take 5 mg by mouth at bedtime.   metoprolol tartrate 25 MG tablet Commonly known as:  LOPRESSOR Take 1 tablet (25 mg total) by mouth 2 (two) times daily.   polyethylene glycol packet Commonly known as:  MIRALAX / GLYCOLAX Take 17 g by mouth daily. Hold if diarrhea   potassium chloride 10 MEQ tablet Commonly known as:  K-DUR Take 1 tablet (10 mEq total) by mouth daily.   senna-docusate 8.6-50 MG tablet Commonly known as:  Senokot-S Take 1 tablet by mouth at bedtime.   vitamin C 500 MG tablet Commonly known as:  ASCORBIC ACID Take 500 mg by mouth daily.      No Known Allergies Follow-up Information    Mcarthur Rossetti, MD. Schedule an appointment as soon as possible for a visit in 2 week(s).   Specialty:  Orthopedic Surgery Contact information: [address] [place] [address] [phone]        hospital discharge follow up with SNF MD,repeat cbc/bmp at follow up. Follow up.            The results of significant  diagnostics from this hospitalization (including imaging, microbiology, ancillary and laboratory) are listed below for reference.    Significant Diagnostic Studies: Ct Head Wo Contrast  Result Date: 06/17/2016 CLINICAL DATA:  Pain after fall. EXAM: CT HEAD WITHOUT CONTRAST CT CERVICAL SPINE WITHOUT CONTRAST TECHNIQUE: Multidetector CT imaging of the head and cervical spine was performed following the standard protocol without intravenous contrast. Multiplanar CT image reconstructions of the cervical spine were also generated. COMPARISON:  None. FINDINGS: CT HEAD FINDINGS Brain: No subdural, epidural, or subarachnoid hemorrhage. Ventricles and sulci are normal for age. Moderate white matter changes are identified. No acute cortical ischemia or infarct. The cerebellum, brainstem, and basal cisterns are normal. No mass, mass effect, or midline shift. Vascular: Calcified atherosclerosis seen in the intracranial carotid arteries. Skull: Normal. Negative for fracture or focal lesion. Sinuses/Orbits: No acute finding. Other: None. CT CERVICAL SPINE FINDINGS Alignment: Minimal anterolisthesis of C2 versus C3, C3 versus C4, and C4 versus C5. Skull base and vertebrae: No fractures. Soft tissues and spinal canal: No prevertebral fluid or swelling. No visible canal hematoma. Disc levels: Multilevel degenerative changes with anterior osteophytes and facet degenerative changes. Upper chest: Scarring in the left apex. Other: No other abnormalities. IMPRESSION: 1. No acute intracranial process. 2. No fracture or traumatic malalignment in the cervical spine. Anterior listhesis at a few levels in the upper cervical spine is thought to be due to facet degenerative changes. Electronically Signed   By: Dorise Bullion III M.D   On: 06/17/2016 20:41   Ct Cervical Spine Wo Contrast  Result Date: 06/17/2016 CLINICAL DATA:  Pain after fall. EXAM: CT HEAD WITHOUT CONTRAST CT CERVICAL SPINE WITHOUT CONTRAST TECHNIQUE: Multidetector  CT imaging of the head and cervical spine was performed following the standard protocol without intravenous contrast. Multiplanar CT image reconstructions of the cervical spine were also generated. COMPARISON:  None. FINDINGS: CT HEAD FINDINGS Brain: No subdural, epidural, or subarachnoid hemorrhage. Ventricles and sulci are normal for age. Moderate white matter changes are identified. No acute cortical ischemia or infarct. The cerebellum, brainstem, and basal cisterns are normal. No mass, mass effect, or midline shift. Vascular: Calcified atherosclerosis seen in the intracranial carotid arteries. Skull: Normal. Negative for fracture or focal lesion. Sinuses/Orbits: No acute finding. Other: None. CT CERVICAL SPINE FINDINGS  Alignment: Minimal anterolisthesis of C2 versus C3, C3 versus C4, and C4 versus C5. Skull base and vertebrae: No fractures. Soft tissues and spinal canal: No prevertebral fluid or swelling. No visible canal hematoma. Disc levels: Multilevel degenerative changes with anterior osteophytes and facet degenerative changes. Upper chest: Scarring in the left apex. Other: No other abnormalities. IMPRESSION: 1. No acute intracranial process. 2. No fracture or traumatic malalignment in the cervical spine. Anterior listhesis at a few levels in the upper cervical spine is thought to be due to facet degenerative changes. Electronically Signed   By: Dorise Bullion III M.D   On: 06/17/2016 20:41   Dg Chest Port 1 View  Result Date: 06/17/2016 CLINICAL DATA:  Preop for hip fracture. EXAM: PORTABLE CHEST 1 VIEW COMPARISON:  04/26/2014 CXR FINDINGS: The heart is normal in size. There is thoracic aortic atherosclerosis. The lungs are clear. Pulmonary vasculature is unremarkable. No acute osseous abnormality. No effusion or pneumothorax. IMPRESSION: No acute cardiopulmonary disease. Electronically Signed   By: Ashley Royalty M.D.   On: 06/17/2016 21:19   Dg C-arm 1-60 Min  Result Date: 06/19/2016 CLINICAL DATA:   Hip fracture repair FLUOROSCOPY TIME:  65 seconds. Images: 2 EXAM: DG C-ARM 61-120 MIN COMPARISON:  June 17, 2016 FINDINGS: Two gamma nails and a rod have been placed across the intertrochanteric fracture. Hardware is in good position. IMPRESSION: Hip fracture repair. Electronically Signed   By: Dorise Bullion III M.D   On: 06/19/2016 10:26   Dg Hip Unilat With Pelvis 2-3 Views Left  Result Date: 06/17/2016 CLINICAL DATA:  Pain after trip and fall. EXAM: DG HIP (WITH OR WITHOUT PELVIS) 2-3V LEFT COMPARISON:  CT from 05/01/2016 FINDINGS: There is an acute, closed, comminuted, post varus angulated intratrochanteric fracture of the left femur with avulsed lesser trochanter. No malalignment of the femoral head from acetabulum. The pubic rami appear intact. Partially visualized aorto bi-iliac stent graft. The bony pelvis appears intact. The contralateral hip is intact. IMPRESSION: Acute, closed, comminuted, varus angulated intratrochanteric fracture of the left femur with avulsed lesser trochanter. Electronically Signed   By: Ashley Royalty M.D.   On: 06/17/2016 20:21   Dg Femur Min 2 Views Left  Result Date: 06/19/2016 CLINICAL DATA:  Hip fracture repair Fluoroscopy: 65 seconds. Images:  4 EXAM: LEFT FEMUR 2 VIEWS COMPARISON:  None. FINDINGS: Two gamma nails and a rod have been placed across the left intertrochanteric fracture. IMPRESSION: Fracture repair as above. Electronically Signed   By: Dorise Bullion III M.D   On: 06/19/2016 10:27    Microbiology: Recent Results (from the past 240 hour(s))  Surgical pcr screen     Status: None   Collection Time: 06/18/16  8:32 AM  Result Value Ref Range Status   MRSA, PCR NEGATIVE NEGATIVE Final   Staphylococcus aureus NEGATIVE NEGATIVE Final    Comment:        The Xpert SA Assay (FDA approved for NASAL specimens in patients over 67 years of age), is one component of a comprehensive surveillance program.  Test performance has been validated by  [facility] for patients greater than or equal to 59 year old. It is not intended to diagnose infection nor to guide or monitor treatment.   Urine culture     Status: None   Collection Time: 06/18/16 10:57 AM  Result Value Ref Range Status   Specimen Description URINE, RANDOM  Final   Special Requests NONE  Final   Culture NO GROWTH  Final  Report Status 06/19/2016 FINAL  Final     Labs: Basic Metabolic Panel:  Recent Labs Lab 06/17/16 1935 06/19/16 0249 06/20/16 0505 06/21/16 0658  NA 139 138 135 138  K 3.2* 4.1 4.0 3.5  CL 103 102 102 105  CO2 25 27 26 25   GLUCOSE 178* 123* 125* 102*  BUN 15 20 17 17   CREATININE 1.60* 1.66* 1.40* 1.33*  CALCIUM 8.7* 8.8* 7.7* 8.2*  MG  --  2.0  --   --    Liver Function Tests:  Recent Labs Lab 06/17/16 1935  AST 21  ALT 14  ALKPHOS 102  BILITOT 0.4  PROT 6.4*  ALBUMIN 3.3*   No results for input(s): LIPASE, AMYLASE in the last 168 hours. No results for input(s): AMMONIA in the last 168 hours. CBC:  Recent Labs Lab 06/17/16 1935 06/19/16 0249 06/20/16 0505 06/21/16 0658  WBC 13.8* 13.9* 11.8* 14.7*  NEUTROABS 10.1*  --   --   --   HGB 12.9 10.6* 8.5* 9.0*  HCT 37.8 31.4* 25.3* 26.1*  MCV 91.1 91.8 91.7 90.0  PLT 218 152 128* 145*   Cardiac Enzymes: No results for input(s): CKTOTAL, CKMB, CKMBINDEX, TROPONINI in the last 168 hours. BNP: BNP (last 3 results) No results for input(s): BNP in the last 8760 hours.  ProBNP (last 3 results) No results for input(s): PROBNP in the last 8760 hours.  CBG: No results for input(s): GLUCAP in the last 168 hours.     SignedFlorencia Reasons MD, PhD  [facility] 06/21/2016, 10:52 AM

## 2016-06-23 ENCOUNTER — Ambulatory Visit: Payer: Medicare Other | Admitting: Podiatry

## 2016-07-04 ENCOUNTER — Encounter: Payer: Self-pay | Admitting: Family Medicine

## 2016-07-05 ENCOUNTER — Ambulatory Visit (INDEPENDENT_AMBULATORY_CARE_PROVIDER_SITE_OTHER): Payer: Medicare Other

## 2016-07-05 ENCOUNTER — Ambulatory Visit (INDEPENDENT_AMBULATORY_CARE_PROVIDER_SITE_OTHER): Payer: Medicare Other | Admitting: Orthopaedic Surgery

## 2016-07-05 ENCOUNTER — Encounter (INDEPENDENT_AMBULATORY_CARE_PROVIDER_SITE_OTHER): Payer: Self-pay | Admitting: Orthopaedic Surgery

## 2016-07-05 DIAGNOSIS — M25552 Pain in left hip: Secondary | ICD-10-CM | POA: Diagnosis not present

## 2016-07-05 DIAGNOSIS — S72142A Displaced intertrochanteric fracture of left femur, initial encounter for closed fracture: Secondary | ICD-10-CM

## 2016-07-05 DIAGNOSIS — M542 Cervicalgia: Secondary | ICD-10-CM

## 2016-07-05 NOTE — Progress Notes (Signed)
The patient is a 82 year old female with dementia who is 16 days out from open reduction internal fixation of the left intertrochanteric hip fracture. Her daughter is with her today. She is convalescing in a nursing care facility. We have allowed her put full weight on her hip. Given her dementia her daughter said she is not eating well so she's been week.  On exam a female easily put her left hip to internal rotation rotation with minimal discomfort. Her incisions look well-healed stable to been removed and Steri-Strips applied. X-rays of her left hip are obtained including AP and lateral and show intact hardware from her surgery and good alignment of her fracture.  She'll continue to work on balance coordination and gait training with a nursing facility. I have no further recommendations. We will see her back for final visit in 4 weeks with a final AP and lateral of her left operative hip.

## 2016-07-18 DIAGNOSIS — J449 Chronic obstructive pulmonary disease, unspecified: Secondary | ICD-10-CM | POA: Diagnosis not present

## 2016-07-18 DIAGNOSIS — S72142D Displaced intertrochanteric fracture of left femur, subsequent encounter for closed fracture with routine healing: Secondary | ICD-10-CM | POA: Diagnosis not present

## 2016-07-18 DIAGNOSIS — R278 Other lack of coordination: Secondary | ICD-10-CM | POA: Diagnosis not present

## 2016-07-18 DIAGNOSIS — N189 Chronic kidney disease, unspecified: Secondary | ICD-10-CM | POA: Diagnosis not present

## 2016-07-18 DIAGNOSIS — M6281 Muscle weakness (generalized): Secondary | ICD-10-CM | POA: Diagnosis not present

## 2016-07-18 DIAGNOSIS — Z7901 Long term (current) use of anticoagulants: Secondary | ICD-10-CM | POA: Diagnosis not present

## 2016-07-18 DIAGNOSIS — I10 Essential (primary) hypertension: Secondary | ICD-10-CM | POA: Diagnosis not present

## 2016-07-18 DIAGNOSIS — Z9181 History of falling: Secondary | ICD-10-CM | POA: Diagnosis not present

## 2016-07-18 DIAGNOSIS — R2689 Other abnormalities of gait and mobility: Secondary | ICD-10-CM | POA: Diagnosis not present

## 2016-07-18 DIAGNOSIS — L89620 Pressure ulcer of left heel, unstageable: Secondary | ICD-10-CM | POA: Diagnosis not present

## 2016-07-20 ENCOUNTER — Encounter: Payer: Self-pay | Admitting: Family Medicine

## 2016-07-21 ENCOUNTER — Telehealth: Payer: Self-pay | Admitting: Family Medicine

## 2016-07-21 NOTE — Telephone Encounter (Signed)
° ° ° ° °  Pt daughter call to say that they are going to be putting her Mom in hospice and that they were going to be contacting Dr Maudie Mercury

## 2016-07-22 ENCOUNTER — Telehealth: Payer: Self-pay | Admitting: Family Medicine

## 2016-07-22 NOTE — Telephone Encounter (Signed)
Pt is at a facility and will be going home on 07-26-16. Katharine Look would like a  Verbal order from md for hospice consult and also if dr Maudie Mercury will be the attending of record for this patient

## 2016-07-22 NOTE — Telephone Encounter (Signed)
Please contact caller - Ok to give verbal order for hospice evaluation if pt/HCPOA requesting. However, I think order could come from doctor from facility that advised hospice as I have not seen pt since hospitalization and we may need to see her to be able to refer? Thanks.

## 2016-07-22 NOTE — Telephone Encounter (Signed)
I called Hospice and informed [place] the on-call line [phone]) of the verbal order and she stated she will relay the message to the nurse and I also called the pts daughter and informed her of this as well.

## 2016-07-22 NOTE — Telephone Encounter (Signed)
° ° ° ° ° °  Hospice is calling asking if they can get a call back today about his request

## 2016-07-22 NOTE — Telephone Encounter (Signed)
See prior message. Let daughter know we will contact hospice and we are so sorry to here she is not doing well.

## 2016-07-25 NOTE — Telephone Encounter (Signed)
Caroline Underwood would like a call back to advise if Dr Maudie Mercury will be the attending physician.  They are unable to use the facility doctor, and pt has requested Dr Maudie Mercury as well. Please call back before 5 pm today.

## 2016-07-25 NOTE — Telephone Encounter (Signed)
I called Hospice and spoke with Katharine Look and she stated she was caling to clarify the information.  I informed her Dr Maudie Mercury agreed to be the attending physician for the pt.

## 2016-07-25 NOTE — Telephone Encounter (Signed)
Caroline Underwood, I thought you already called them? Please call back today to see what the need? Ok to be attending.Thanks!

## 2016-08-02 ENCOUNTER — Ambulatory Visit (INDEPENDENT_AMBULATORY_CARE_PROVIDER_SITE_OTHER): Payer: Self-pay | Admitting: Orthopaedic Surgery

## 2016-08-06 ENCOUNTER — Encounter (INDEPENDENT_AMBULATORY_CARE_PROVIDER_SITE_OTHER): Payer: Self-pay | Admitting: Orthopaedic Surgery

## 2016-08-30 ENCOUNTER — Ambulatory Visit: Payer: Medicare Other | Admitting: Family Medicine

## 2016-10-06 DIAGNOSIS — B351 Tinea unguium: Secondary | ICD-10-CM | POA: Diagnosis not present

## 2016-10-06 DIAGNOSIS — L603 Nail dystrophy: Secondary | ICD-10-CM | POA: Diagnosis not present

## 2016-10-06 DIAGNOSIS — I739 Peripheral vascular disease, unspecified: Secondary | ICD-10-CM | POA: Diagnosis not present

## 2016-11-24 ENCOUNTER — Encounter: Payer: Self-pay | Admitting: Neurology

## 2016-11-28 ENCOUNTER — Ambulatory Visit: Payer: Medicare Other | Admitting: Neurology

## 2016-11-30 ENCOUNTER — Other Ambulatory Visit: Payer: Self-pay | Admitting: Family Medicine

## 2016-12-01 ENCOUNTER — Other Ambulatory Visit: Payer: Self-pay

## 2016-12-01 MED ORDER — SENNOSIDES-DOCUSATE SODIUM 8.6-50 MG PO TABS: 1.0000 | ORAL_TABLET | Freq: Every day | ORAL | 2 refills | Status: DC

## 2016-12-03 ENCOUNTER — Encounter: Payer: Self-pay | Admitting: Family Medicine

## 2016-12-05 ENCOUNTER — Encounter: Payer: Self-pay | Admitting: Family Medicine

## 2016-12-05 ENCOUNTER — Other Ambulatory Visit: Payer: Self-pay | Admitting: *Deleted

## 2016-12-05 MED ORDER — SENNOSIDES-DOCUSATE SODIUM 8.6-50 MG PO TABS: 1.0000 | ORAL_TABLET | Freq: Three times a day (TID) | ORAL | 3 refills | Status: AC

## 2016-12-05 NOTE — Telephone Encounter (Signed)
Rx done and pts daughter notified via Mychart message.

## 2016-12-22 ENCOUNTER — Telehealth: Payer: Self-pay | Admitting: Emergency Medicine

## 2016-12-22 NOTE — Telephone Encounter (Signed)
Ok to give order? 

## 2016-12-22 NOTE — Telephone Encounter (Signed)
I left a detailed message with the verbal order at Lindsey's cell number listed below.

## 2016-12-22 NOTE — Telephone Encounter (Signed)
Lindsey from [facility] is calling to receive an verbal order for pt regarding a 5 night stay at [facility] for Restipite.   Call back number [phone]

## 2017-02-14 DIAGNOSIS — I739 Peripheral vascular disease, unspecified: Secondary | ICD-10-CM | POA: Diagnosis not present

## 2017-02-14 DIAGNOSIS — L605 Yellow nail syndrome: Secondary | ICD-10-CM | POA: Diagnosis not present

## 2017-02-14 DIAGNOSIS — L603 Nail dystrophy: Secondary | ICD-10-CM | POA: Diagnosis not present

## 2017-02-14 DIAGNOSIS — B351 Tinea unguium: Secondary | ICD-10-CM | POA: Diagnosis not present

## 2017-02-18 ENCOUNTER — Other Ambulatory Visit: Payer: Self-pay | Admitting: Family Medicine

## 2017-02-28 ENCOUNTER — Telehealth: Payer: Self-pay | Admitting: Family Medicine

## 2017-02-28 NOTE — Telephone Encounter (Signed)
[facility] calling to get "OK" to change the generic Levothryroxine to a different generic brand that they have in stock and they have to contact us for approval because it is a narrow therapeutic index medication.  Can be reached for approval at [phone].  Ask for Edie (Eeee-dee).

## 2017-02-28 NOTE — Telephone Encounter (Signed)
ok 

## 2017-02-28 NOTE — Telephone Encounter (Signed)
I called the pharmacy and informed Edie of the message below.

## 2017-04-06 ENCOUNTER — Encounter: Payer: Self-pay | Admitting: Family Medicine

## 2017-04-21 ENCOUNTER — Encounter: Payer: Self-pay | Admitting: Family Medicine

## 2017-04-25 ENCOUNTER — Encounter: Payer: Self-pay | Admitting: Neurology

## 2017-05-03 ENCOUNTER — Telehealth: Payer: Self-pay | Admitting: Neurology

## 2017-05-03 NOTE — Telephone Encounter (Signed)
Patient's daughter [name] called needing to see if Dr. Tomi Likens can write a letter stating that her mother has Dementia and they are needing to give that to their lawyer to help apply for medicaid. She has not seen Dr. Tomi Likens in a while, she has been in Hospice care. Please Advise. Thanks

## 2017-05-03 NOTE — Telephone Encounter (Signed)
Called and spoke with Caroline Underwood, advsd her the letter has been at the front desk waiting to be picked up and that I had responded to her email concerning this. She will stop by later today to pick up letter.

## 2017-05-04 ENCOUNTER — Telehealth: Payer: Self-pay | Admitting: Neurology

## 2017-05-04 ENCOUNTER — Non-Acute Institutional Stay (SKILLED_NURSING_FACILITY): Payer: PPO | Admitting: Internal Medicine

## 2017-05-04 ENCOUNTER — Encounter: Payer: Self-pay | Admitting: *Deleted

## 2017-05-04 DIAGNOSIS — G309 Alzheimer's disease, unspecified: Secondary | ICD-10-CM | POA: Diagnosis not present

## 2017-05-04 DIAGNOSIS — R627 Adult failure to thrive: Secondary | ICD-10-CM | POA: Diagnosis not present

## 2017-05-04 DIAGNOSIS — F0281 Dementia in other diseases classified elsewhere with behavioral disturbance: Secondary | ICD-10-CM | POA: Diagnosis not present

## 2017-05-04 DIAGNOSIS — R1310 Dysphagia, unspecified: Secondary | ICD-10-CM | POA: Diagnosis not present

## 2017-05-04 NOTE — Progress Notes (Signed)
[facility]  PCP: Lucretia Kern, DO [place] [id number]   This is a comprehensive Hospice admission note to [facility] performed on this date less than 30 days from date of admission. Included are preadmission medical/surgical history;reconciled medication list; family history; social history and comprehensive review of systems.  Corrections and additions to the records were documented . Comprehensive physical exam was also performed. Additionally a clinical summary was entered for each active diagnosis pertinent to this admission in the Problem List to enhance continuity of care.  HPI: Hospice follows the patient at home.Epic records reveal no office or face-to-face encounters otherwise since 07/05/16. She saw Dr. Ninfa Linden, orthopedics at that time for follow-up of open reduction and internal fixation of left intertrochanteric hip fracture. She was convalescing in an SNF at that time for strengthening and balance training. She last saw her primary care physician 06/07/16 to complete advanced directives. DO NOT RESUSCITATE form was completed at that time. She's had no labs since 06/21/16. At that time creatinine was 1.33 and she exhibited a normochromic, normocytic anemia with hemoglobin 9 and hematocrit 26.1. She has failure to thrive in the context of advanced age and comorbidities of dementia, essential hypertension, A. fib, peripheral arterial disease, COPD, GERD, hypothyroidism, chronic kidney disease, dyslipidemia, and history of breast cancer in 2014. Her dementia is associated with behavioral dysfunction for which she can receive Haldol 2 mg 2 pills  q 4 hrs as needed. She's also on Norco one every 6 hours as needed for unspecified reasons. Fortunately her daughter [name] was in the room when I went to examine the patient. According to her daughter patient denies any pain and has not been receiving  pain medications at home. The daughter will require retinal surgery and will be unable to care for her mother in the near future. Over the last several weeks the patient has become more agitated particularly at night, trying to get out of bed. The daughter has found that Haldol 2 mg 2 pills at bedtime and one pill each morning seems to work well. The patient has been essentially bedbound since January of this year following the orthopedic surgery . Hospice has discontinued her blood pressure pills and thyroid medication because of dysphagia. The patient has been on chopped meat and mashed potatoes as she chokes with meals and on pills. The pills have been crushed.  Past medical and surgical history::According to the daughter the patient had abdominal aortic aneurysm surgery.  Social history:She is former smoker having quit in 2009. She does not drink  Family history:Reviewed  Review of systems: See history as provided by daughter. The patient has no complaints.  Physical exam:  Pertinent or positive findings:The patient identified her daughter as her niece, she cannot name her daughter. Dentition is fair with some plaque formation and some broken teeth. Heart rhythm is irregular irregular. She has mild diffuse low-grade rales. No abdominal operative scars despite the history of abdominal aortic aneurysm repair. She is weak but strength to opposition is surprisingly good in all extremities. She does have limb atrophy. Dorsalis pedis pulses are decreased, palpable posterior tibial pulses are noted.   General appearance:Adequately nourished; no acute distress , increased work of breathing is present.   Lymphatic: No lymphadenopathy about the head, neck, axilla . Eyes: No conjunctival inflammation or lid edema is present. There is no scleral icterus. Ears:  External ear exam shows no significant lesions or deformities.  Nose:  External nasal examination shows no deformity or inflammation. Nasal  mucosa are pink and moist without lesions ,exudates Oral exam: lips and gums are healthy appearing.There is no oropharyngeal erythema or exudate . Neck:  No thyromegaly, masses, tenderness noted.    Heart:  No gallop, murmur, click, rub .  Lungs: without wheezes, rhonchi, rubs. Abdomen:Bowel sounds are normal. Abdomen is soft and nontender with no organomegaly, hernias,masses. GU: deferred  Extremities:  No cyanosis, clubbing,edema  Neurologic exam : Balance,Rhomberg,finger to nose testing could not be completed due to clinical state Skin: Warm & dry w/o tenting. No significant lesions or rash.  See clinical summary under each active problem in the Problem List with associated updated therapeutic plan

## 2017-05-04 NOTE — Assessment & Plan Note (Addendum)
Change Haldol to 2 mg 2 pills at bedtime and 1 in the morning with additional pill every 6 hours as needed after these maintenance doses. The schedule was that the daughter has found effective at home

## 2017-05-04 NOTE — Assessment & Plan Note (Signed)
Speech therapy evaluation if dysphagia problematic here at the SNF

## 2017-05-04 NOTE — Assessment & Plan Note (Addendum)
Hospice respite during the period of daughter's retinal surgery & post op recovery

## 2017-05-04 NOTE — Progress Notes (Signed)
error 

## 2017-05-04 NOTE — Telephone Encounter (Signed)
Dr Tomi Likens- Is this the instance where we have Dr Si Raider test and is not covered by insurance?

## 2017-05-04 NOTE — Patient Instructions (Signed)
See assessment and plan under each diagnosis in the problem list and acutely for this visit 

## 2017-05-04 NOTE — Telephone Encounter (Signed)
The law firm called needing to have another letter faxed from Dr. Tomi Likens stating the patient's Competency. Please Advise. Thanks

## 2017-05-05 ENCOUNTER — Encounter: Payer: Self-pay | Admitting: Internal Medicine

## 2017-05-05 NOTE — Telephone Encounter (Signed)
We cannot provide a letter stating competency because it is a legal term that requires specific testing.  However, we can provide a letter stating that she needs assistance with finances and medication/medical care.

## 2017-05-05 NOTE — Telephone Encounter (Signed)
Attempted to rtrn call to telephone number provided, [phone], no answer, called telephone nubber indicated of incoming call, [phone] with  Man who identified himself as someone who works in the attorneys office husband. States office is closed on Fridays. I thanked him and advsd I will call again on Mon

## 2017-05-09 ENCOUNTER — Other Ambulatory Visit: Payer: Self-pay | Admitting: *Deleted

## 2017-05-09 NOTE — Patient Outreach (Signed)
[facility]) Care Management  05/09/2017  Caroline Underwood [birth year] [id number]   Met with Caroline Underwood, SW for [facility]. Reviewed patient case. She confirms that patient is under Hospice care and there for short term respite.  RNCM will sign off case.  Royetta Crochet. Laymond Purser, RN, BSN, [facility] Coordinator [phone]

## 2017-05-10 NOTE — Telephone Encounter (Signed)
Called  The Law office, spoke with a paralegal who needed the nama of the person who called me. I advsd her I have not been provided a name. She will have someone call me back. I had to ask her to take the patients name so they would know who I was calling in refence to and advsd  that our office closes at 4:30

## 2017-05-10 NOTE — Telephone Encounter (Signed)
[facility] called in regards to a letter about pt's competency and asked if they could have it by 5:00 today [phone]

## 2017-05-15 ENCOUNTER — Encounter: Payer: Self-pay | Admitting: Neurology

## 2017-05-16 DIAGNOSIS — I739 Peripheral vascular disease, unspecified: Secondary | ICD-10-CM | POA: Diagnosis not present

## 2017-05-16 DIAGNOSIS — B351 Tinea unguium: Secondary | ICD-10-CM | POA: Diagnosis not present

## 2017-07-12 ENCOUNTER — Telehealth: Payer: Self-pay | Admitting: Family Medicine

## 2017-07-12 NOTE — Telephone Encounter (Signed)
Copied from [place]. Topic: Quick Communication - See Telephone Encounter >> Jul 12, 2017  2:16 PM Arletha Grippe wrote: CRM for notification. See Telephone encounter for:   07/12/17.Lindsey from [employer] called -  Pt will be going to respite at [facility] starting tomorrow for 5 nights  Cb# [phone]

## 2017-07-12 NOTE — Telephone Encounter (Signed)
fyi

## 2017-07-27 ENCOUNTER — Encounter: Payer: Self-pay | Admitting: Family Medicine

## 2017-08-18 DEATH — deceased

## 2018-01-01 IMAGING — CT CT CERVICAL SPINE W/O CM
4 of 7 series · 14 of 33 positions shown, 15 images · non-contrast
Comparison: None.

CLINICAL DATA: Pain after fall.

EXAM:
CT HEAD WITHOUT CONTRAST
CT CERVICAL SPINE WITHOUT CONTRAST
TECHNIQUE: Multidetector CT imaging of the head and cervical spine was
performed following the standard protocol without intravenous
contrast. Multiplanar CT image reconstructions of the cervical spine
were also generated.

[Series 8: c_spine 2.0 st · axial · 0.30mm/px · z∈[-272,-158]mm · 4 of 97 slices shown, 5 images]
[im 20/97  soft-tissue]
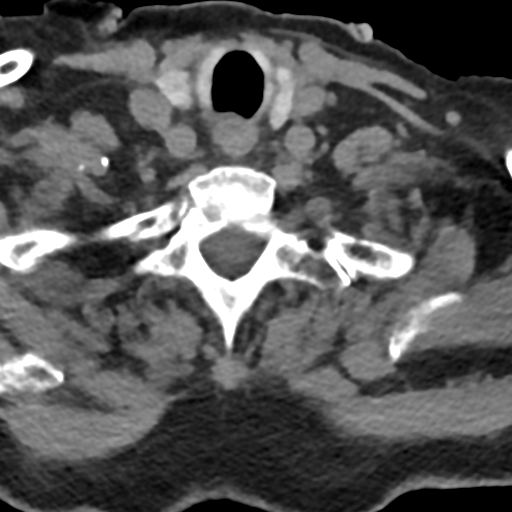
[im 20/97  bone]
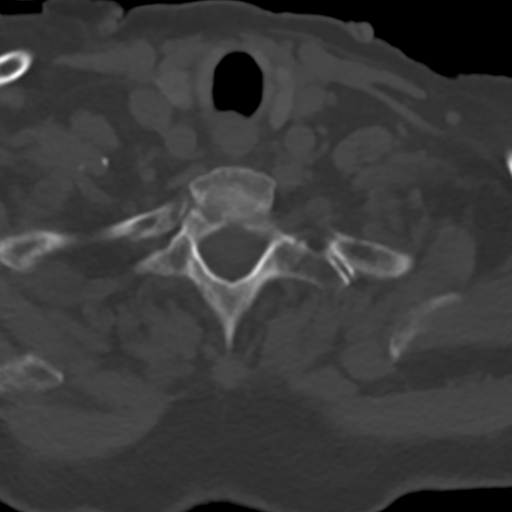
[im 39/97  bone]
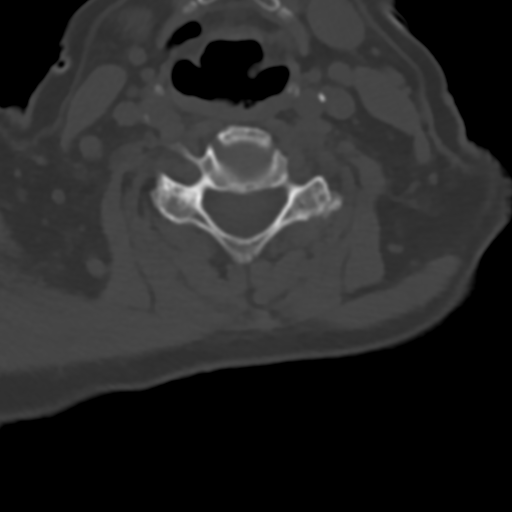
[im 58/97  bone]
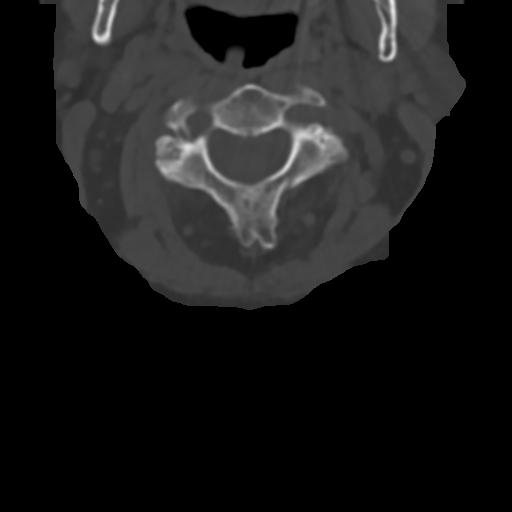
[im 77/97  bone]
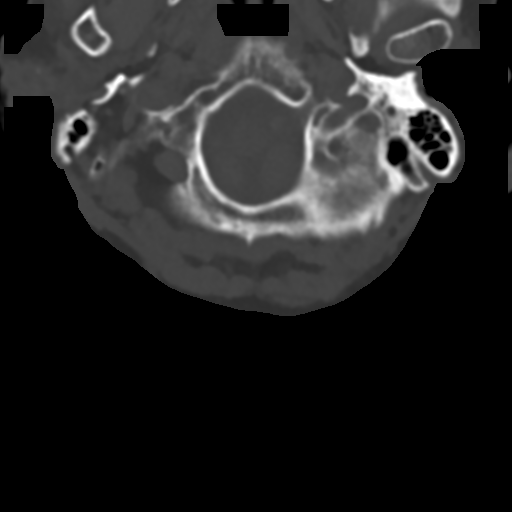

[Series 11: coronal bone · coronal · 0.23mm/px · 1 of 61 slices shown]
[im 31/61  bone]
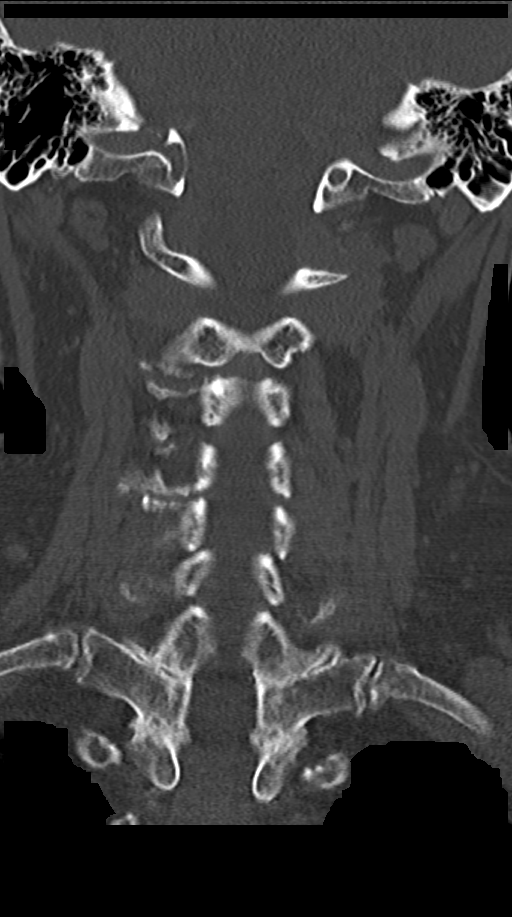

[Series 12: sagittal bone · sagittal · 0.27mm/px · 5 of 61 slices shown]
[im 11/61  bone]
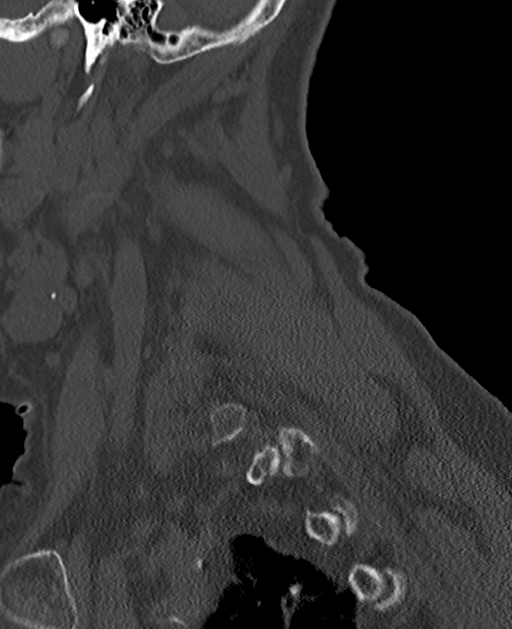
[im 21/61  bone]
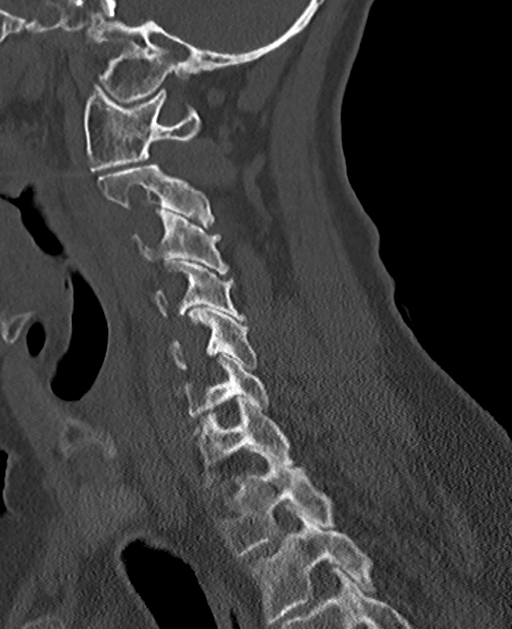
[im 31/61  bone]
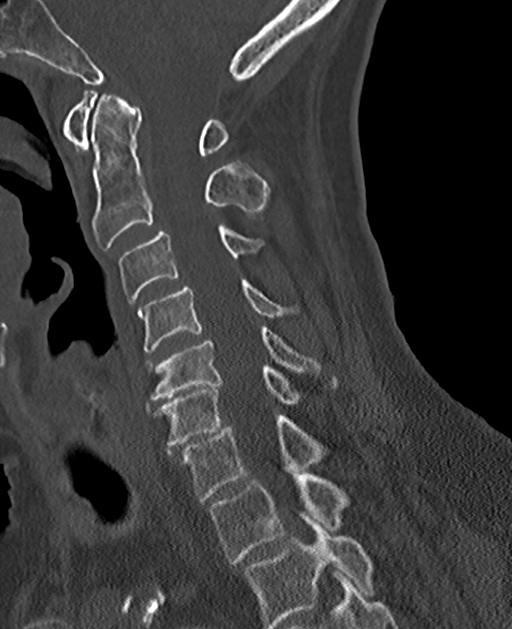
[im 41/61  bone]
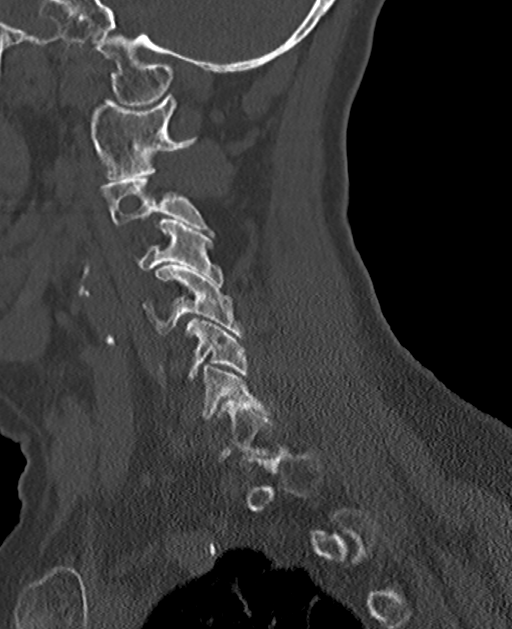
[im 51/61  bone]
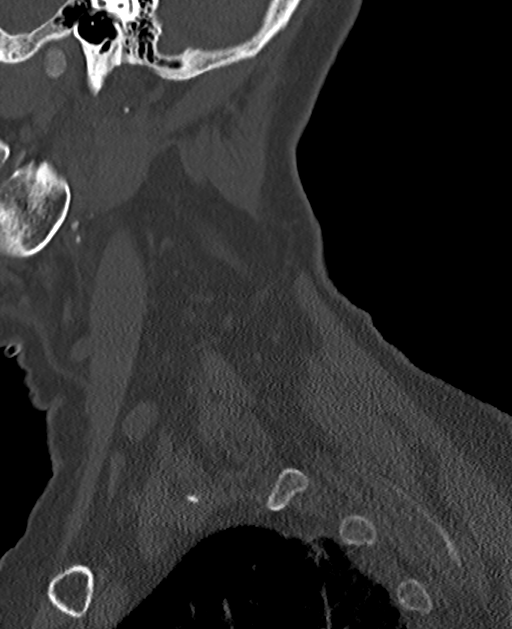

[Series 14: orthogonal st · axial · 0.21mm/px · z∈[-288,-201]mm · 4 of 86 slices shown]
[im 18/86  bone]
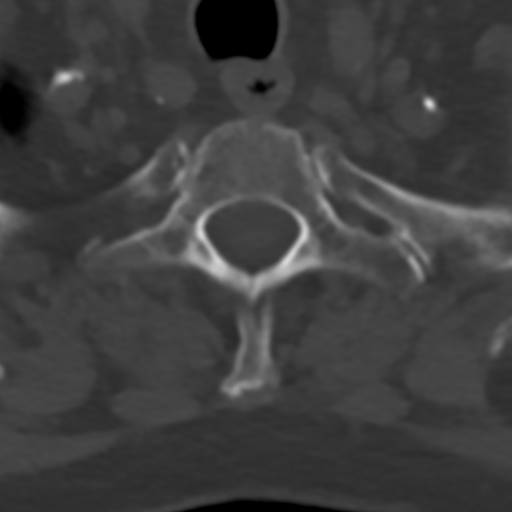
[im 35/86  bone]
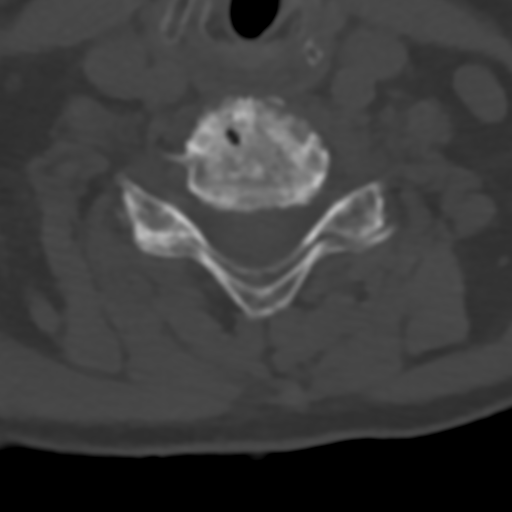
[im 52/86  bone]
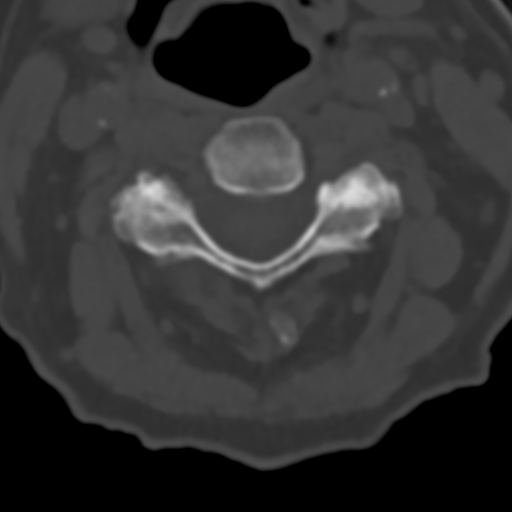
[im 69/86  bone]
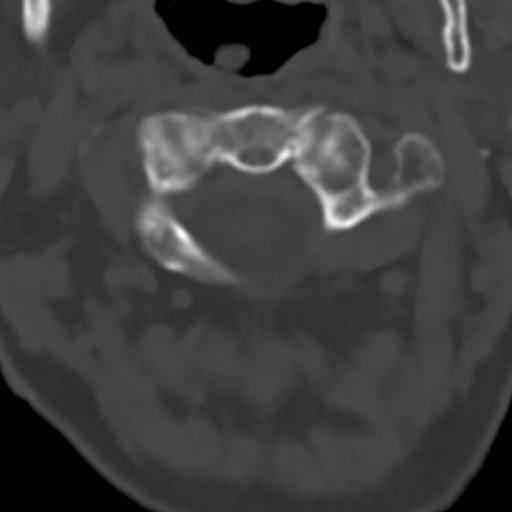

[14 of 33 positions shown; findings below may reference images not displayed]

FINDINGS: CT HEAD FINDINGS

Brain: No subdural, epidural, or subarachnoid hemorrhage. Ventricles
and sulci are normal for age. Moderate white matter changes are
identified. No acute cortical ischemia or infarct. The cerebellum,
brainstem, and basal cisterns are normal. No mass, mass effect, or
midline shift.

Vascular: Calcified atherosclerosis seen in the intracranial carotid
arteries.

Skull: Normal. Negative for fracture or focal lesion.

Sinuses/Orbits: No acute finding.

Other: None.

CT CERVICAL SPINE FINDINGS

Alignment: Minimal anterolisthesis of C2 versus C3, C3 versus C4,
and C4 versus C5.

Skull base and vertebrae: No fractures.

Soft tissues and spinal canal: No prevertebral fluid or swelling. No
visible canal hematoma.

Disc levels: Multilevel degenerative changes with anterior
osteophytes and facet degenerative changes.

Upper chest: Scarring in the left apex.

Other: No other abnormalities.
IMPRESSION: 1. No acute intracranial process.
2. No fracture or traumatic malalignment in the cervical spine.
Anterior listhesis at a few levels in the upper cervical spine is
thought to be due to facet degenerative changes.

## 2018-01-01 IMAGING — CR DG CHEST 1V PORT
1 series · 1 of 1 positions shown · non-contrast
Comparison: 04/26/2014 CXR

CLINICAL DATA: Preop for hip fracture.

EXAM:
PORTABLE CHEST 1 VIEW

[AP]
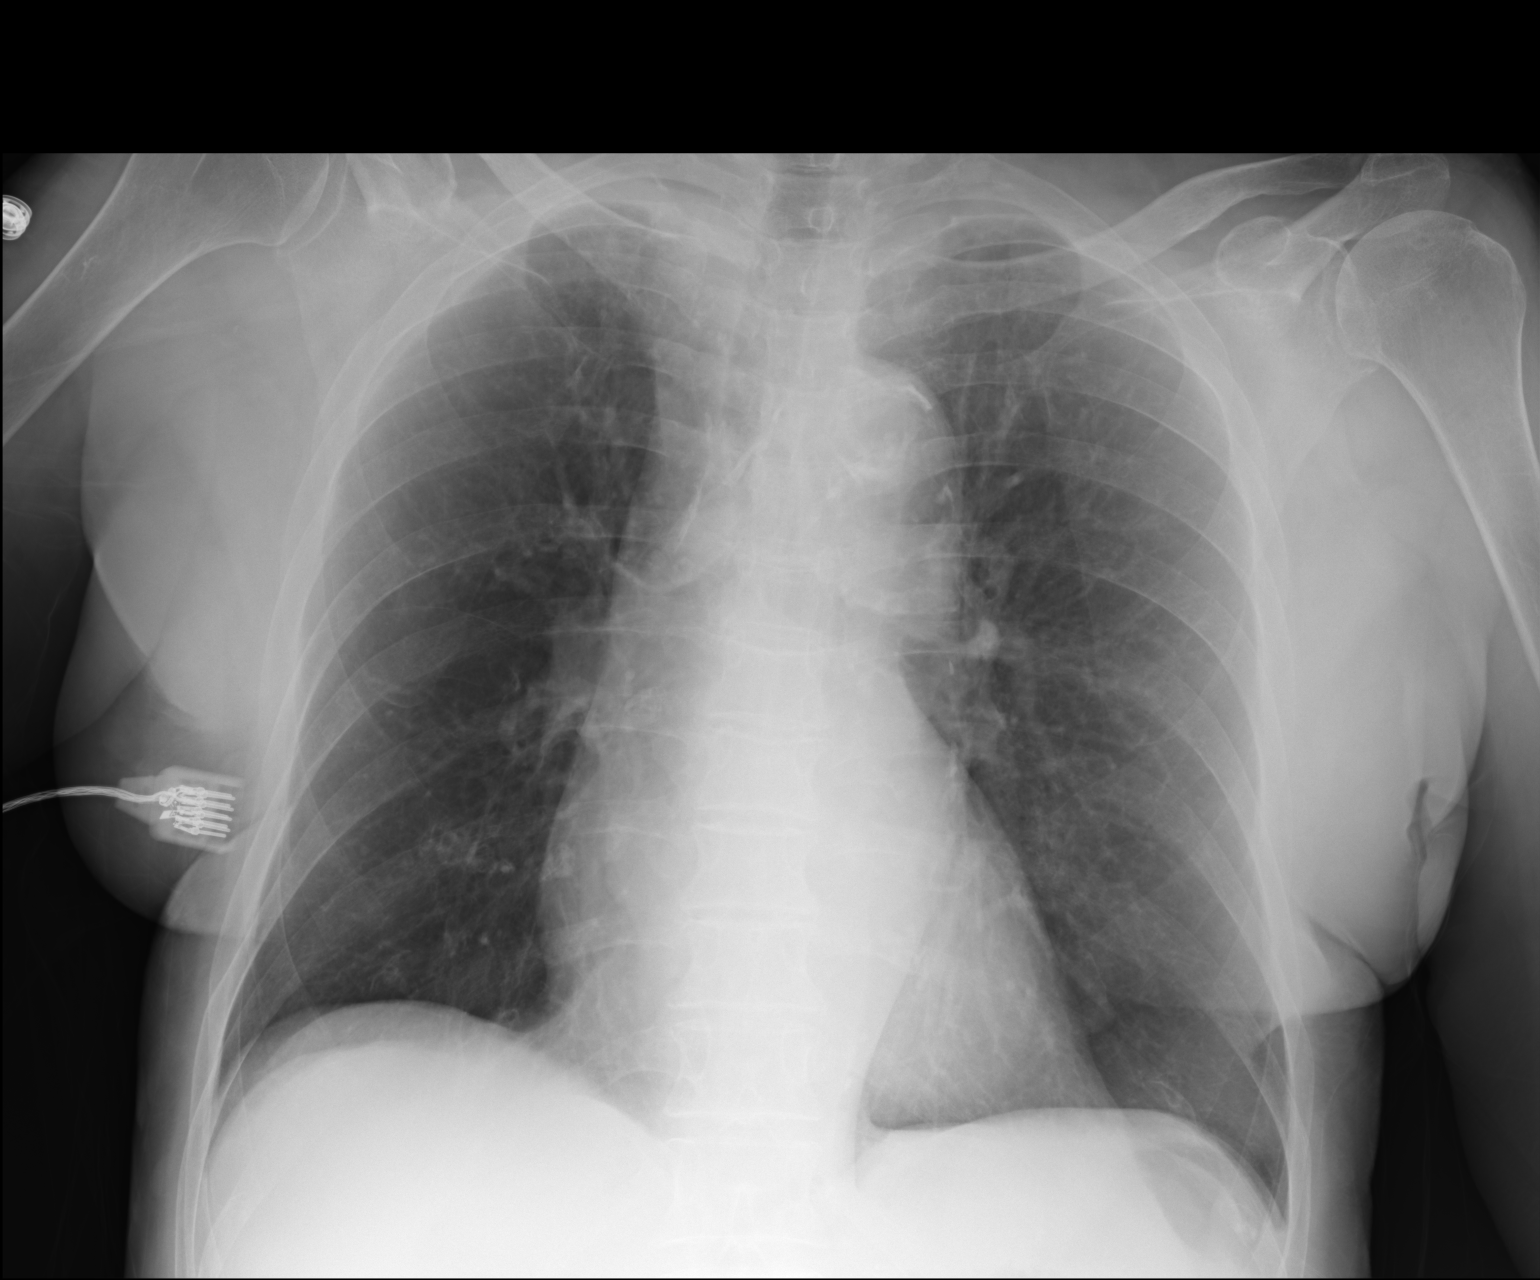

[1 of 1 positions shown; findings below may reference images not displayed]

FINDINGS: The heart is normal in size. There is thoracic aortic
atherosclerosis. The lungs are clear. Pulmonary vasculature is
unremarkable. No acute osseous abnormality. No effusion or
pneumothorax.
IMPRESSION: No acute cardiopulmonary disease.
